# Patient Record
Sex: Male | Born: 1985 | Race: White | Hispanic: No | Marital: Single | Smoking: Current every day smoker
Health system: Southern US, Community
[De-identification: ages and names within clinical notes are randomized; demographics above are authoritative.]

## PROBLEM LIST (undated history)

## (undated) DIAGNOSIS — I10 Essential (primary) hypertension: Secondary | ICD-10-CM

---
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
## 2016-03-13 ENCOUNTER — Encounter (HOSPITAL_BASED_OUTPATIENT_CLINIC_OR_DEPARTMENT_OTHER): Payer: Self-pay | Admitting: *Deleted

## 2016-03-13 ENCOUNTER — Emergency Department (HOSPITAL_BASED_OUTPATIENT_CLINIC_OR_DEPARTMENT_OTHER)
Admission: EM | Admit: 2016-03-13 | Discharge: 2016-03-13 | Disposition: A | Discharge status: Home / Self Care | Payer: Self-pay | Attending: Emergency Medicine | Admitting: Emergency Medicine

## 2016-03-13 DIAGNOSIS — Z760 Encounter for issue of repeat prescription: Secondary | ICD-10-CM | POA: Insufficient documentation

## 2016-03-13 DIAGNOSIS — I10 Essential (primary) hypertension: Secondary | ICD-10-CM | POA: Insufficient documentation

## 2016-03-13 DIAGNOSIS — F17210 Nicotine dependence, cigarettes, uncomplicated: Secondary | ICD-10-CM | POA: Insufficient documentation

## 2016-03-13 DIAGNOSIS — Z79899 Other long term (current) drug therapy: Secondary | ICD-10-CM | POA: Insufficient documentation

## 2016-03-13 HISTORY — DX: Essential (primary) hypertension: I10

## 2016-03-13 MED ORDER — LISINOPRIL-HYDROCHLOROTHIAZIDE 20-12.5 MG PO TABS: 1.0000 | ORAL_TABLET | Freq: Every day | ORAL | 0 refills | Status: DC

## 2016-03-13 NOTE — ED Provider Notes (Signed)
[facility]   CSN: [id number] Arrival date & time: 03/13/16  1143     History   Chief Complaint Chief Complaint  Patient presents with  . Medication Refill    HPI  Blood pressure 161/100, pulse 95, temperature 98.4 F (36.9 C), temperature source Oral, resp. rate 16, height 6' (1.829 m), weight 77.1 kg, SpO2 98 %.  Shaun [name] is a 30 y.o. male requesting refill of his lisinopril hydrochlorothiazide, had his last dose this a.m. while he was in jail, patient is scheduled to go to an outpatient opiate detox facility but they have stated that they cannot take him without his medication. He will be returned to jail over the weekend and will need a prescription to go to the rehabilitation facility. Patient denies chest pain, shortness of breath, headache, change in vision, dysarthria, ataxia.  HPI  Past Medical History:  Diagnosis Date  . Hypertension     There are no active problems to display for this patient.   History reviewed. No pertinent surgical history.     Home Medications    Prior to Admission medications   Medication Sig Start Date End Date Taking? Authorizing Provider  HYDROCHLOROTHIAZIDE PO Take by mouth.   Yes Historical Provider, MD  LISINOPRIL PO Take by mouth.   Yes Historical Provider, MD  lisinopril-hydrochlorothiazide (PRINZIDE,ZESTORETIC) 20-12.5 MG tablet Take 1 tablet by mouth daily. 03/13/16   Joni ReiningNicole Averil Digman, PA-C    Family History No family history on file.  Social History Social History  Substance Use Topics  . Smoking status: Current Every Day Smoker    Packs/day: 1.00    Types: Cigarettes  . Smokeless tobacco: Never Used  . Alcohol use No     Allergies   Penicillins   Review of Systems Review of Systems   Physical Exam Updated Vital Signs BP 161/100 (BP Location: Left Arm)   Pulse 95   Temp 98.4 F (36.9 C) (Oral)   Resp 16   Ht 6' (1.829 m)   Wt 77.1 kg   SpO2 98%   BMI 23.06 kg/m    Physical Exam  Constitutional: He is oriented to person, place, and time. He appears well-developed and well-nourished. No distress.  HENT:  Head: Normocephalic and atraumatic.  Mouth/Throat: Oropharynx is clear and moist.  Eyes: Conjunctivae and EOM are normal. Pupils are equal, round, and reactive to light.  Neck: Normal range of motion.  Cardiovascular: Normal rate, regular rhythm and intact distal pulses.   Pulmonary/Chest: Effort normal and breath sounds normal.  Abdominal: Soft. There is no tenderness.  Musculoskeletal: Normal range of motion.  Neurological: He is alert and oriented to person, place, and time.  Skin: He is not diaphoretic.  Psychiatric: He has a normal mood and affect.  Nursing note and vitals reviewed.    ED Treatments / Results  Labs (all labs ordered are listed, but only abnormal results are displayed) Labs Reviewed - No data to display  EKG  EKG Interpretation None       Radiology No results found.  Procedures Procedures (including critical care time)  Medications Ordered in ED Medications - No data to display   Initial Impression / Assessment and Plan / ED Course  I have reviewed the triage vital signs and the nursing notes.  Pertinent labs & imaging results that were available during my care of the patient were reviewed by me and considered in my medical decision making (see chart for details).  Clinical Course  Vitals:   03/13/16 1150  BP: 161/100  Pulse: 95  Resp: 16  Temp: 98.4 F (36.9 C)  TempSrc: Oral  SpO2: 98%  Weight: 77.1 kg  Height: 6' (1.829 m)     Shaun [name] is 30 y.o. male presenting with Request for medication refill, patient was discharged from jail this a.m., he was set to go to opiates rehabilitation however, he hasn't had his high blood pressure medication so they will not let him into rehabilitation, he will be returned to jail over the weekend and will go back to detox facility on Monday but he will  need a prescription for high blood pressure medication. Patient is asymptomatic.  Evaluation does not show pathology that would require ongoing emergent intervention or inpatient treatment. Pt is hemodynamically stable and mentating appropriately. Discussed findings and plan with patient/guardian, who agrees with care plan. All questions answered. Return precautions discussed and outpatient follow up given.      Final Clinical Impressions(s) / ED Diagnoses   Final diagnoses:  Medication refill    New Prescriptions New Prescriptions   LISINOPRIL-HYDROCHLOROTHIAZIDE (PRINZIDE,ZESTORETIC) 20-12.5 MG TABLET    Take 1 tablet by mouth daily.     Wynetta Emery, PA-C 03/13/16 1216    Lyndal Pulley, MD 03/13/16 [phone]

## 2016-03-13 NOTE — ED Triage Notes (Signed)
States he just got out of jail and needs a refill on his BP medications until he can establish a physician.

## 2016-03-13 NOTE — Discharge Instructions (Signed)
Please follow with your primary care doctor in the next 2 days for a check-up. They must obtain records for further management.  ° °Do not hesitate to return to the Emergency Department for any new, worsening or concerning symptoms.  ° °

## 2016-04-29 DIAGNOSIS — F1910 Other psychoactive substance abuse, uncomplicated: Secondary | ICD-10-CM | POA: Diagnosis present

## 2016-04-29 DIAGNOSIS — B182 Chronic viral hepatitis C: Secondary | ICD-10-CM | POA: Diagnosis present

## 2016-04-29 DIAGNOSIS — B1920 Unspecified viral hepatitis C without hepatic coma: Secondary | ICD-10-CM | POA: Diagnosis present

## 2018-08-30 DIAGNOSIS — F322 Major depressive disorder, single episode, severe without psychotic features: Secondary | ICD-10-CM | POA: Diagnosis present

## 2018-08-31 DIAGNOSIS — F1510 Other stimulant abuse, uncomplicated: Secondary | ICD-10-CM | POA: Insufficient documentation

## 2019-10-20 DIAGNOSIS — A401 Sepsis due to streptococcus, group B: Secondary | ICD-10-CM

## 2019-10-20 HISTORY — DX: Sepsis due to Streptococcus, group B: A40.1

## 2019-12-30 ENCOUNTER — Inpatient Hospital Stay (HOSPITAL_COMMUNITY)
Admission: EM | Admit: 2019-12-30 | Discharge: 2020-01-08 | DRG: 854 | Disposition: A | Discharge status: Home / Self Care | Attending: Internal Medicine | Admitting: Internal Medicine

## 2019-12-30 ENCOUNTER — Emergency Department (HOSPITAL_COMMUNITY)

## 2019-12-30 ENCOUNTER — Other Ambulatory Visit: Payer: Self-pay

## 2019-12-30 ENCOUNTER — Encounter (HOSPITAL_COMMUNITY): Payer: Self-pay | Admitting: Emergency Medicine

## 2019-12-30 DIAGNOSIS — F17210 Nicotine dependence, cigarettes, uncomplicated: Secondary | ICD-10-CM | POA: Diagnosis present

## 2019-12-30 DIAGNOSIS — R21 Rash and other nonspecific skin eruption: Secondary | ICD-10-CM | POA: Diagnosis present

## 2019-12-30 DIAGNOSIS — L02414 Cutaneous abscess of left upper limb: Secondary | ICD-10-CM | POA: Diagnosis present

## 2019-12-30 DIAGNOSIS — I82622 Acute embolism and thrombosis of deep veins of left upper extremity: Secondary | ICD-10-CM | POA: Diagnosis not present

## 2019-12-30 DIAGNOSIS — F1490 Cocaine use, unspecified, uncomplicated: Secondary | ICD-10-CM | POA: Diagnosis present

## 2019-12-30 DIAGNOSIS — Z87898 Personal history of other specified conditions: Secondary | ICD-10-CM

## 2019-12-30 DIAGNOSIS — L03114 Cellulitis of left upper limb: Secondary | ICD-10-CM

## 2019-12-30 DIAGNOSIS — I252 Old myocardial infarction: Secondary | ICD-10-CM

## 2019-12-30 DIAGNOSIS — I5022 Chronic systolic (congestive) heart failure: Secondary | ICD-10-CM

## 2019-12-30 DIAGNOSIS — Z79899 Other long term (current) drug therapy: Secondary | ICD-10-CM

## 2019-12-30 DIAGNOSIS — K5900 Constipation, unspecified: Secondary | ICD-10-CM | POA: Diagnosis present

## 2019-12-30 DIAGNOSIS — Z86718 Personal history of other venous thrombosis and embolism: Secondary | ICD-10-CM | POA: Diagnosis present

## 2019-12-30 DIAGNOSIS — I2510 Atherosclerotic heart disease of native coronary artery without angina pectoris: Secondary | ICD-10-CM

## 2019-12-30 DIAGNOSIS — I110 Hypertensive heart disease with heart failure: Secondary | ICD-10-CM | POA: Diagnosis present

## 2019-12-30 DIAGNOSIS — I472 Ventricular tachycardia: Secondary | ICD-10-CM | POA: Diagnosis not present

## 2019-12-30 DIAGNOSIS — I82409 Acute embolism and thrombosis of unspecified deep veins of unspecified lower extremity: Secondary | ICD-10-CM | POA: Diagnosis not present

## 2019-12-30 DIAGNOSIS — F1911 Other psychoactive substance abuse, in remission: Secondary | ICD-10-CM

## 2019-12-30 DIAGNOSIS — Z880 Allergy status to penicillin: Secondary | ICD-10-CM

## 2019-12-30 DIAGNOSIS — I5021 Acute systolic (congestive) heart failure: Secondary | ICD-10-CM | POA: Diagnosis not present

## 2019-12-30 DIAGNOSIS — L0291 Cutaneous abscess, unspecified: Secondary | ICD-10-CM

## 2019-12-30 DIAGNOSIS — A419 Sepsis, unspecified organism: Principal | ICD-10-CM | POA: Diagnosis present

## 2019-12-30 DIAGNOSIS — R918 Other nonspecific abnormal finding of lung field: Secondary | ICD-10-CM | POA: Diagnosis present

## 2019-12-30 DIAGNOSIS — Z20822 Contact with and (suspected) exposure to covid-19: Secondary | ICD-10-CM | POA: Diagnosis present

## 2019-12-30 DIAGNOSIS — F1910 Other psychoactive substance abuse, uncomplicated: Secondary | ICD-10-CM

## 2019-12-30 DIAGNOSIS — Z885 Allergy status to narcotic agent status: Secondary | ICD-10-CM

## 2019-12-30 DIAGNOSIS — I10 Essential (primary) hypertension: Secondary | ICD-10-CM

## 2019-12-30 DIAGNOSIS — E782 Mixed hyperlipidemia: Secondary | ICD-10-CM | POA: Diagnosis present

## 2019-12-30 DIAGNOSIS — I808 Phlebitis and thrombophlebitis of other sites: Secondary | ICD-10-CM | POA: Diagnosis present

## 2019-12-30 DIAGNOSIS — I255 Ischemic cardiomyopathy: Secondary | ICD-10-CM

## 2019-12-30 HISTORY — DX: Nicotine dependence, cigarettes, uncomplicated: F17.210

## 2019-12-30 HISTORY — DX: Chronic systolic (congestive) heart failure: I50.22

## 2019-12-30 HISTORY — DX: Essential (primary) hypertension: I10

## 2019-12-30 HISTORY — DX: Ischemic cardiomyopathy: I25.5

## 2019-12-30 HISTORY — DX: Atherosclerotic heart disease of native coronary artery without angina pectoris: I25.10

## 2019-12-30 HISTORY — DX: Cellulitis of left upper limb: L03.114

## 2019-12-30 HISTORY — DX: Other psychoactive substance abuse, uncomplicated: F19.10

## 2019-12-30 HISTORY — DX: Mixed hyperlipidemia: E78.2

## 2019-12-30 LAB — CBC WITH DIFFERENTIAL/PLATELET
Abs Immature Granulocytes: 0.03 10*3/uL (ref 0.00–0.07)
Basophils Absolute: 0 10*3/uL (ref 0.0–0.1)
Basophils Relative: 0 %
Eosinophils Absolute: 0 10*3/uL (ref 0.0–0.5)
Eosinophils Relative: 0 %
HCT: 42.6 % (ref 39.0–52.0)
Hemoglobin: 13.9 g/dL (ref 13.0–17.0)
Immature Granulocytes: 0 %
Lymphocytes Relative: 14 %
Lymphs Abs: 1.5 10*3/uL (ref 0.7–4.0)
MCH: 27.6 pg (ref 26.0–34.0)
MCHC: 32.6 g/dL (ref 30.0–36.0)
MCV: 84.5 fL (ref 80.0–100.0)
Monocytes Absolute: 0.8 10*3/uL (ref 0.1–1.0)
Monocytes Relative: 7 %
Neutro Abs: 8.7 10*3/uL — ABNORMAL HIGH (ref 1.7–7.7)
Neutrophils Relative %: 79 %
Platelets: 363 10*3/uL (ref 150–400)
RBC: 5.04 MIL/uL (ref 4.22–5.81)
RDW: 13.3 % (ref 11.5–15.5)
WBC: 11.1 10*3/uL — ABNORMAL HIGH (ref 4.0–10.5)
nRBC: 0 % (ref 0.0–0.2)

## 2019-12-30 LAB — URINALYSIS, ROUTINE W REFLEX MICROSCOPIC
Bacteria, UA: NONE SEEN
Bilirubin Urine: NEGATIVE
Glucose, UA: NEGATIVE mg/dL
Ketones, ur: NEGATIVE mg/dL
Leukocytes,Ua: NEGATIVE
Nitrite: NEGATIVE
Protein, ur: NEGATIVE mg/dL
Specific Gravity, Urine: 1.019 (ref 1.005–1.030)
pH: 7 (ref 5.0–8.0)

## 2019-12-30 LAB — COMPREHENSIVE METABOLIC PANEL
ALT: 19 U/L (ref 0–44)
AST: 16 U/L (ref 15–41)
Albumin: 3.7 g/dL (ref 3.5–5.0)
Alkaline Phosphatase: 84 U/L (ref 38–126)
Anion gap: 11 (ref 5–15)
BUN: 15 mg/dL (ref 6–20)
CO2: 26 mmol/L (ref 22–32)
Calcium: 8.9 mg/dL (ref 8.9–10.3)
Chloride: 95 mmol/L — ABNORMAL LOW (ref 98–111)
Creatinine, Ser: 1.03 mg/dL (ref 0.61–1.24)
GFR calc Af Amer: >60 mL/min (ref >60)
GFR calc non Af Amer: >60 mL/min (ref >60)
Glucose, Bld: 119 mg/dL — ABNORMAL HIGH (ref 70–99)
Potassium: 4 mmol/L (ref 3.5–5.1)
Sodium: 132 mmol/L — ABNORMAL LOW (ref 135–145)
Total Bilirubin: 0.8 mg/dL (ref 0.3–1.2)
Total Protein: 8.8 g/dL — ABNORMAL HIGH (ref 6.5–8.1)

## 2019-12-30 LAB — SARS CORONAVIRUS 2 BY RT PCR (HOSPITAL ORDER, PERFORMED IN ~~LOC~~ HOSPITAL LAB): SARS Coronavirus 2: NEGATIVE

## 2019-12-30 LAB — LACTIC ACID, PLASMA: Lactic Acid, Venous: 1.7 mmol/L (ref 0.5–1.9)

## 2019-12-30 MED ORDER — SODIUM CHLORIDE 0.9 % IV SOLN
2.0000 g | INTRAVENOUS | Status: DC
  Administered 2019-12-31 – 2020-01-04 (×5): 2 g via INTRAVENOUS
  Filled 2019-12-30 (×3): qty 2
  Filled 2019-12-30 (×2): qty 20

## 2019-12-30 MED ORDER — APIXABAN 5 MG PO TABS
5.0000 mg | ORAL_TABLET | Freq: Two times a day (BID) | ORAL | Status: DC
  Administered 2020-01-06 – 2020-01-08 (×4): 5 mg via ORAL
  Filled 2019-12-30 (×4): qty 1

## 2019-12-30 MED ORDER — LISINOPRIL 10 MG PO TABS
10.0000 mg | ORAL_TABLET | Freq: Every day | ORAL | Status: DC
  Administered 2019-12-30 – 2020-01-08 (×9): 10 mg via ORAL
  Filled 2019-12-30 (×9): qty 1

## 2019-12-30 MED ORDER — HYDROMORPHONE HCL 1 MG/ML IJ SOLN
1.0000 mg | INTRAMUSCULAR | Status: DC | PRN
  Administered 2019-12-30 – 2019-12-31 (×3): 1 mg via INTRAVENOUS
  Filled 2019-12-30 (×3): qty 1

## 2019-12-30 MED ORDER — ONDANSETRON HCL 4 MG PO TABS: 4.0000 mg | ORAL_TABLET | Freq: Four times a day (QID) | ORAL | Status: DC | PRN

## 2019-12-30 MED ORDER — CARVEDILOL 3.125 MG PO TABS
3.1250 mg | ORAL_TABLET | Freq: Two times a day (BID) | ORAL | Status: DC
  Administered 2019-12-30 – 2020-01-08 (×17): 3.125 mg via ORAL
  Filled 2019-12-30 (×17): qty 1

## 2019-12-30 MED ORDER — CEFTRIAXONE SODIUM 2 G IJ SOLR
2.0000 g | Freq: Once | INTRAMUSCULAR | Status: AC
  Administered 2019-12-30: 2 g via INTRAVENOUS
  Filled 2019-12-30: qty 20

## 2019-12-30 MED ORDER — ACETAMINOPHEN 325 MG PO TABS
650.0000 mg | ORAL_TABLET | Freq: Four times a day (QID) | ORAL | Status: DC | PRN
  Administered 2019-12-30 – 2020-01-08 (×22): 650 mg via ORAL
  Filled 2019-12-30 (×24): qty 2

## 2019-12-30 MED ORDER — HYDROMORPHONE HCL 1 MG/ML IJ SOLN
1.0000 mg | Freq: Once | INTRAMUSCULAR | Status: AC
  Administered 2019-12-30: 1 mg via INTRAVENOUS
  Filled 2019-12-30: qty 1

## 2019-12-30 MED ORDER — ONDANSETRON HCL 4 MG/2ML IJ SOLN
4.0000 mg | Freq: Once | INTRAMUSCULAR | Status: AC
  Administered 2019-12-30: 4 mg via INTRAVENOUS
  Filled 2019-12-30: qty 2

## 2019-12-30 MED ORDER — OXYCODONE-ACETAMINOPHEN 5-325 MG PO TABS
1.0000 | ORAL_TABLET | ORAL | Status: DC | PRN
  Administered 2019-12-31: 1 via ORAL
  Filled 2019-12-30: qty 1

## 2019-12-30 MED ORDER — MORPHINE SULFATE (PF) 4 MG/ML IV SOLN
4.0000 mg | Freq: Once | INTRAVENOUS | Status: AC
  Administered 2019-12-30: 4 mg via INTRAVENOUS
  Filled 2019-12-30: qty 1

## 2019-12-30 MED ORDER — ONDANSETRON HCL 4 MG/2ML IJ SOLN: 4.0000 mg | Freq: Four times a day (QID) | INTRAMUSCULAR | Status: DC | PRN

## 2019-12-30 MED ORDER — ACETAMINOPHEN 650 MG RE SUPP: 650.0000 mg | Freq: Four times a day (QID) | RECTAL | Status: DC | PRN

## 2019-12-30 MED ORDER — APIXABAN 5 MG PO TABS
10.0000 mg | ORAL_TABLET | Freq: Two times a day (BID) | ORAL | Status: AC
  Administered 2019-12-30 – 2020-01-06 (×12): 10 mg via ORAL
  Filled 2019-12-30 (×13): qty 2

## 2019-12-30 MED ORDER — ATORVASTATIN CALCIUM 40 MG PO TABS
40.0000 mg | ORAL_TABLET | Freq: Every day | ORAL | Status: DC
  Administered 2019-12-30 – 2020-01-07 (×9): 40 mg via ORAL
  Filled 2019-12-30 (×9): qty 1

## 2019-12-30 MED ORDER — POLYETHYLENE GLYCOL 3350 17 G PO PACK
17.0000 g | PACK | Freq: Every day | ORAL | Status: DC | PRN
  Administered 2020-01-02 – 2020-01-04 (×3): 17 g via ORAL
  Filled 2019-12-30 (×3): qty 1

## 2019-12-30 MED ORDER — SODIUM CHLORIDE 0.9 % IV BOLUS
1000.0000 mL | Freq: Once | INTRAVENOUS | Status: AC
  Administered 2019-12-30: 1000 mL via INTRAVENOUS

## 2019-12-30 MED ORDER — ASPIRIN EC 81 MG PO TBEC
81.0000 mg | DELAYED_RELEASE_TABLET | Freq: Every day | ORAL | Status: DC
  Administered 2019-12-30 – 2020-01-08 (×9): 81 mg via ORAL
  Filled 2019-12-30 (×9): qty 1

## 2019-12-30 MED ORDER — NICOTINE 21 MG/24HR TD PT24
21.0000 mg | MEDICATED_PATCH | Freq: Every day | TRANSDERMAL | Status: DC
  Administered 2019-12-30 – 2020-01-08 (×9): 21 mg via TRANSDERMAL
  Filled 2019-12-30 (×10): qty 1

## 2019-12-30 MED ORDER — VANCOMYCIN HCL IN DEXTROSE 1-5 GM/200ML-% IV SOLN
1000.0000 mg | Freq: Once | INTRAVENOUS | Status: AC
  Administered 2019-12-30: 1000 mg via INTRAVENOUS
  Filled 2019-12-30: qty 200

## 2019-12-30 NOTE — ED Notes (Signed)
Unable to obtain second set of culture before IV antibiotics given due to pt being a difficult stick. MD made aware.

## 2019-12-30 NOTE — Progress Notes (Signed)
A consult was received from an ED physician for vancomycin per pharmacy dosing (for an indication other than meningitis). The patient's profile has been reviewed for ht/wt/allergies/indication/available labs. A one time order has been placed for the above antibiotics.  Further antibiotics/pharmacy consults should be ordered by admitting physician if indicated.                       Bernadene Person, PharmD, BCPS [phone] 12/30/2019, 4:12 PM

## 2019-12-30 NOTE — ED Triage Notes (Signed)
Per pt, states he was at [facility] for sepsis and blood clots-states he left AMA-states he fell in jail landing on left arm-states it was swollen prior to fall-increased pain-last IV drug use was 9 days ago

## 2019-12-30 NOTE — ED Provider Notes (Addendum)
[facility]-EMERGENCY DEPT Provider Note   CSN: [id number] Arrival date & time: 12/30/19  1148     History Chief Complaint  Patient presents with  . Fall    Caz Weaver is a 34 y.o. male.  Patient with hx ivda, c/o increased redness, swelling, and pain to LUE in past 3-4 days. States has been in jail for past week, prior to that ivda into LUE. Was hospitalized a couple months ago with cellulitis/infection to LUE, and DVT LUE, but left hospital AMA without continued antibiotic therapy or anticoagulation therapy. Symptoms acute onset, moderate-severe, constant, persistent. Denies LUE/hand numbness or weakness. +subjective fevers. Denies recent trauma to arm. No chest pain or sob.   The history is provided by the patient.  Fall Pertinent negatives include no chest pain, no abdominal pain, no headaches and no shortness of breath.       Past Medical History:  Diagnosis Date  . Hypertension     There are no problems to display for this patient.   History reviewed. No pertinent surgical history.     No family history on file.  Social History   Tobacco Use  . Smoking status: Current Every Day Smoker    Packs/day: 1.00    Types: Cigarettes  . Smokeless tobacco: Never Used  Substance Use Topics  . Alcohol use: No  . Drug use: Yes    Types: IV    Comment: opiates    Home Medications Prior to Admission medications   Medication Sig Start Date End Date Taking? Authorizing Provider  HYDROCHLOROTHIAZIDE PO Take by mouth.    [provider]  LISINOPRIL PO Take by mouth.    [provider]  lisinopril-hydrochlorothiazide (PRINZIDE,ZESTORETIC) 20-12.5 MG tablet Take 1 tablet by mouth daily. 03/13/16   Pisciotta, Joni Reining, PA-C    Allergies    Penicillins  Review of Systems   Review of Systems  Constitutional: Positive for fever.  HENT: Negative for sore throat.   Eyes: Negative for redness.  Respiratory: Negative for cough and  shortness of breath.   Cardiovascular: Negative for chest pain.  Gastrointestinal: Positive for nausea. Negative for abdominal pain and vomiting.  Genitourinary: Negative for dysuria and flank pain.  Musculoskeletal: Negative for back pain and neck pain.  Skin:       Multiple small skin lesions, scabbed appearance, approximately .5 -1 cm diameter.   Neurological: Negative for headaches.  Hematological: Does not bruise/bleed easily.  Psychiatric/Behavioral: Negative for confusion.    Physical Exam Updated Vital Signs BP (!) 130/101 (BP Location: Right Arm)   Pulse (!) 107   Temp 98.4 F (36.9 C) (Oral)   Resp (!) 24   SpO2 100%   Physical Exam Vitals and nursing note reviewed.  Constitutional:      Appearance: Normal appearance. He is well-developed.  HENT:     Head: Atraumatic.     Nose: Nose normal.     Mouth/Throat:     Mouth: Mucous membranes are moist.     Pharynx: Oropharynx is clear.  Eyes:     General: No scleral icterus.    Conjunctiva/sclera: Conjunctivae normal.     Pupils: Pupils are equal, round, and reactive to light.  Neck:     Trachea: No tracheal deviation.  Cardiovascular:     Rate and Rhythm: Regular rhythm. Tachycardia present.     Pulses: Normal pulses.     Heart sounds: Normal heart sounds. No murmur heard.  No friction rub. No gallop.  Pulmonary:     Effort: Pulmonary effort is normal. No accessory muscle usage or respiratory distress.     Breath sounds: Normal breath sounds.  Abdominal:     General: Bowel sounds are normal. There is no distension.     Palpations: Abdomen is soft.     Tenderness: There is no abdominal tenderness. There is no guarding.  Genitourinary:    Comments: No cva tenderness. Musculoskeletal:     Cervical back: Normal range of motion and neck supple. No rigidity.     Comments: Erythema and increased warmth to LUE from wrist to upper arm. Moderate swelling to left forearm. Radial pulse 2+, normal cap refill in fingers.   Compartments of arm soft, not tense, no severe pain w rom at digits, wrist or elbow. No crepitus. No abscess.   Skin:    General: Skin is warm and dry.     Findings: Rash present.     Comments: Cellulitis LUE. Multiple, sparse, skin lesions/scabs to head, bil extremities, 1/2-1cm diameter.   Neurological:     Mental Status: He is alert.     Comments: Alert, speech clear. LUE rad/med/uln fxn, motor and sensory, intact.   Psychiatric:        Mood and Affect: Mood normal.     ED Results / Procedures / Treatments   Labs (all labs ordered are listed, but only abnormal results are displayed) Results for orders placed or performed during the hospital encounter of 12/30/19  Lactic acid, plasma  Result Value Ref Range   Lactic Acid, Venous 1.7 0.5 - 1.9 mmol/L  Comprehensive metabolic panel  Result Value Ref Range   Sodium 132 (L) 135 - 145 mmol/L   Potassium 4.0 3.5 - 5.1 mmol/L   Chloride 95 (L) 98 - 111 mmol/L   CO2 26 22 - 32 mmol/L   Glucose, Bld 119 (H) 70 - 99 mg/dL   BUN 15 6 - 20 mg/dL   Creatinine, Ser 1.03 0.61 - 1.24 mg/dL   Calcium 8.9 8.9 - 10.3 mg/dL   Total Protein 8.8 (H) 6.5 - 8.1 g/dL   Albumin 3.7 3.5 - 5.0 g/dL   AST 16 15 - 41 U/L   ALT 19 0 - 44 U/L   Alkaline Phosphatase 84 38 - 126 U/L   Total Bilirubin 0.8 0.3 - 1.2 mg/dL   GFR calc non Af Amer >60 >60 mL/min   GFR calc Af Amer >60 >60 mL/min   Anion gap 11 5 - 15  CBC with Differential  Result Value Ref Range   WBC 11.1 (H) 4.0 - 10.5 K/uL   RBC 5.04 4.22 - 5.81 MIL/uL   Hemoglobin 13.9 13.0 - 17.0 g/dL   HCT 42.6 39 - 52 %   MCV 84.5 80.0 - 100.0 fL   MCH 27.6 26.0 - 34.0 pg   MCHC 32.6 30.0 - 36.0 g/dL   RDW 13.3 11.5 - 15.5 %   Platelets 363 150 - 400 K/uL   nRBC 0.0 0.0 - 0.2 %   Neutrophils Relative % 79 %   Neutro Abs 8.7 (H) 1.7 - 7.7 K/uL   Lymphocytes Relative 14 %   Lymphs Abs 1.5 0.7 - 4.0 K/uL   Monocytes Relative 7 %   Monocytes Absolute 0.8 0 - 1 K/uL   Eosinophils Relative 0 %     Eosinophils Absolute 0.0 0 - 0 K/uL   Basophils Relative 0 %   Basophils Absolute 0.0 0 - 0 K/uL  Immature Granulocytes 0 %   Abs Immature Granulocytes 0.03 0.00 - 0.07 K/uL   DG Forearm Left  Result Date: 12/30/2019 CLINICAL DATA:  Pain in LEFT forearm unable to rotate arm into full position. EXAM: LEFT FOREARM - 2 VIEW COMPARISON:  RIGHT wrist of 02/07/2019 FINDINGS: Extensive soft tissue swelling about the LEFT forearm. No gas in the soft tissues by plain film radiography. No sign of fracture or dislocation. IMPRESSION: Marked soft tissue swelling about the LEFT forearm. No acute osseous abnormality. Correlate with any signs of infection. Electronically Signed   By: Donzetta Kohut M.D.   On: 12/30/2019 14:07    EKG None  Radiology DG Forearm Left  Result Date: 12/30/2019 CLINICAL DATA:  Pain in LEFT forearm unable to rotate arm into full position. EXAM: LEFT FOREARM - 2 VIEW COMPARISON:  RIGHT wrist of 02/07/2019 FINDINGS: Extensive soft tissue swelling about the LEFT forearm. No gas in the soft tissues by plain film radiography. No sign of fracture or dislocation. IMPRESSION: Marked soft tissue swelling about the LEFT forearm. No acute osseous abnormality. Correlate with any signs of infection. Electronically Signed   By: Donzetta Kohut M.D.   On: 12/30/2019 14:07    Procedures Procedures (including critical care time)  Medications Ordered in ED Medications  vancomycin (VANCOCIN) IVPB 1000 mg/200 mL premix (has no administration in time range)  cefTRIAXone (ROCEPHIN) 2 g in sodium chloride 0.9 % 100 mL IVPB (has no administration in time range)  HYDROmorphone (DILAUDID) injection 1 mg (has no administration in time range)  ondansetron (ZOFRAN) injection 4 mg (has no administration in time range)  sodium chloride 0.9 % bolus 1,000 mL (has no administration in time range)    ED Course  I have reviewed the triage vital signs and the nursing notes.  Pertinent labs & imaging  results that were available during my care of the patient were reviewed by me and considered in my medical decision making (see chart for details).    MDM Rules/Calculators/A&P                          Iv ns. Stat labs and cultures. Imaging.   Reviewed nursing notes and prior charts for additional history. Recent admission for similar symptoms reviewed - dx w acute dvt and cellulitis/soft tissue infection then, pt left AMA.  Pt requests pain med - dilaudid 1 mg iv. zofran iv. IV NS bolus.   Labs reviewed/interpreted by me - lactate is normal. Wbc elev.   Given significant cellulitis LUE, will admit for iv abx, and starting back on anticoag therapy.   Pain improved w meds.   Hospitalists consulted for admission.  Final Clinical Impression(s) / ED Diagnoses Final diagnoses:  None    Rx / DC Orders ED Discharge Orders    None          Cathren Laine, MD 12/30/19 1856

## 2019-12-30 NOTE — H&P (Signed)
History and Physical    Shaun [name] CVE:[id number] DOB: January 18, 1986 DOA: 12/30/2019  PCP: Patient, No Pcp Per  Patient coming from: Prison   Chief Complaint:  Chief Complaint  Patient presents with  . Fall     HPI:    34 year old male with past medical history of intravenous drug abuse (Heroin, Fentanyl, Cocaine), ischemic cardiomyopathy status post MI with drug-eluting stent in 2017, systolic congestive heart failure (echo 2017 with EF 25%), hypertension, hyperlipidemia who presents to [facility] emergency department complaints of left upper extremity pain.  Of note, patient was hospitalized at [facility] in April 2021.  During that hospitalization patient was found to have cellulitis of the chin and left upper extremity with concurrent left upper extremity DVT.  Patient was also found to have group B strep sepsis.  Patient was initially treated with anticoagulation and intravenous antibiotic therapy however patient ended up leaving AMA prior to completion of treatment.  Patient left without receiving any home-going anticoagulation or antibiotics.  Patient was incarcerated 8 days ago.  Patient explains that approximately 4 days ago the pain in his left upper extremity began to worsen.  Pain begins in the forearm and radiates proximally.  Pain is 10 out of 10 in intensity, burning to sharp in quality, worse with movement of the left upper extremity.  Patient also complains of poor appetite and generalized weakness.  Patient is also complaining of subjective fevers.  Patient admits to continued injection of various drugs including fentanyl even in that arm with last injection 8 days ago.  Patient was brought in to [facility] emergency department for evaluation and is being escorted by an Technical sales engineer.  Upon evaluation in the emergency department patient is clinically felt to be suffering from persisting left upper extremity cellulitis and suspected persisting  left upper extremity DVT.  Patient was provided with Eliquis 10 mg, ceftriaxone 2 g and 1 g of vancomycin.  Patient was also provided with 1 L of normal saline.  Blood cultures were obtained.  The hospitalist group was then called to assess the patient for admission the hospital.  Review of Systems: A 10-system review of systems has been performed and all systems are negative with the exception of what is listed in the HPI.   Past Medical History:  Diagnosis Date  . Beta-hemolytic group B streptococcal sepsis (HCC) 10/2019   at [facility]  . Chronic systolic CHF (congestive heart failure) (HCC) 12/30/2019  . Coronary artery disease involving native coronary artery of native heart without angina pectoris 12/30/2019  . Essential hypertension 12/30/2019  . Hypertension   . Intravenous drug abuse (HCC) 12/30/2019  . Ischemic cardiomyopathy 12/30/2019  . Mixed hyperlipidemia 12/30/2019  . Nicotine dependence, cigarettes, uncomplicated 12/30/2019    History reviewed. No pertinent surgical history.   reports that he has been smoking cigarettes. He has been smoking about 1.00 pack per day. He has never used smokeless tobacco. He reports current drug use. Drug: IV. He reports that he does not drink alcohol.  Allergies  Allergen Reactions  . Codeine Itching  . Penicillins     Itching Did it involve swelling of the face/tongue/throat, SOB, or low BP? SOB & Swelling Which Caused Blood Clots Did it involve sudden or severe rash/hives, skin peeling, or any reaction on the inside of your mouth or nose? No Did you need to seek medical attention at a hospital or doctor's office? Y When did it last happen?Over month ago If all  above answers are "NO", may proceed with cephalosporin use.      Family History  Family history unknown: Yes     Prior to Admission medications   Medication Sig Start Date End Date Taking? Authorizing Provider  Atorvastatin Calcium (LIPITOR PO) Take 1 tablet by mouth  daily.   Yes [provider]  FLUoxetine (PROZAC) 20 MG capsule Take 20 mg by mouth daily.   Yes [provider]  levETIRAcetam (KEPPRA PO) Take 1 tablet by mouth daily.   Yes [provider]  lisinopril-hydrochlorothiazide (PRINZIDE,ZESTORETIC) 20-12.5 MG tablet Take 1 tablet by mouth daily. 03/13/16  Yes Waynetta Pean    Physical Exam: Vitals:   12/30/19 1830 12/30/19 1930 12/30/19 1950 12/30/19 2000  BP: (!) 143/78 133/82  134/88  Pulse: (!) 108 (!) 106  (!) 110  Resp: 18 16  18   Temp:      TempSrc:      SpO2: 95% 96%  97%  Weight:   72.6 kg   Height:   6' (1.829 m)     Constitutional: Acute alert and oriented x3, patient is in distress due to pain. Skin: Significant redness and induration of the left upper extremity without palpated area of fluctuance.  Somewhat poor skin turgor noted. Eyes: Pupils are equally reactive to light.  No evidence of scleral icterus or conjunctival pallor.  ENMT: Moist mucous membranes noted.  Posterior pharynx clear of any exudate or lesions.   Neck: normal, supple, no masses, no thyromegaly.  No evidence of jugular venous distension.   Respiratory: Mild bibasilar rales noted, no evidence of wheezing.  Normal respiratory effort. No accessory muscle use.  Cardiovascular: Regular rate and rhythm, no murmurs / rubs / gallops. No extremity edema. 2+ pedal pulses. No carotid bruits.  Chest:   Nontender without crepitus or deformity.   Back:   Nontender without crepitus or deformity. Abdomen: Abdomen is soft and nontender.  No evidence of intra-abdominal masses.  Positive bowel sounds noted in all quadrants.   Musculoskeletal: Extensive left lower extremity edema with associated redness and induration as detailed in the skin examination.  Significant pain with both passive and active range of motion of both the wrist and digits of the left hand.   Neurologic: CN 2-12 grossly intact.  Patient is moving all 4 extremities  spontaneously, strength of the left upper extremity is limited due to pain and swelling.  Patient is following all commands.  Patient is responsive to verbal stimuli.   Psychiatric: Patient exhibits a anxious mood with labile affect.  Patient seems to possess insight as to theircurrent situation.     Labs on Admission: I have personally reviewed following labs and imaging studies -   CBC: Recent Labs  Lab 12/30/19 1208  WBC 11.1*  NEUTROABS 8.7*  HGB 13.9  HCT 42.6  MCV 84.5  PLT 195   Basic Metabolic Panel: Recent Labs  Lab 12/30/19 1208  NA 132*  K 4.0  CL 95*  CO2 26  GLUCOSE 119*  BUN 15  CREATININE 1.03  CALCIUM 8.9   GFR: Estimated Creatinine Clearance: 103.8 mL/min (by C-G formula based on SCr of 1.03 mg/dL). Liver Function Tests: Recent Labs  Lab 12/30/19 1208  AST 16  ALT 19  ALKPHOS 84  BILITOT 0.8  PROT 8.8*  ALBUMIN 3.7   No results for input(s): LIPASE, AMYLASE in the last 168 hours. No results for input(s): AMMONIA in the last 168 hours. Coagulation Profile: No results for input(s): INR,  PROTIME in the last 168 hours. Cardiac Enzymes: No results for input(s): CKTOTAL, CKMB, CKMBINDEX, TROPONINI in the last 168 hours. BNP (last 3 results) No results for input(s): PROBNP in the last 8760 hours. HbA1C: No results for input(s): HGBA1C in the last 72 hours. CBG: No results for input(s): GLUCAP in the last 168 hours. Lipid Profile: No results for input(s): CHOL, HDL, LDLCALC, TRIG, CHOLHDL, LDLDIRECT in the last 72 hours. Thyroid Function Tests: No results for input(s): TSH, T4TOTAL, FREET4, T3FREE, THYROIDAB in the last 72 hours. Anemia Panel: No results for input(s): VITAMINB12, FOLATE, FERRITIN, TIBC, IRON, RETICCTPCT in the last 72 hours. Urine analysis: No results found for: COLORURINE, APPEARANCEUR, LABSPEC, PHURINE, GLUCOSEU, HGBUR, BILIRUBINUR, KETONESUR, PROTEINUR, UROBILINOGEN, NITRITE, LEUKOCYTESUR  Radiological Exams on Admission -  Personally Reviewed: DG Forearm Left  Result Date: 12/30/2019 CLINICAL DATA:  Pain in LEFT forearm unable to rotate arm into full position. EXAM: LEFT FOREARM - 2 VIEW COMPARISON:  RIGHT wrist of 02/07/2019 FINDINGS: Extensive soft tissue swelling about the LEFT forearm. No gas in the soft tissues by plain film radiography. No sign of fracture or dislocation. IMPRESSION: Marked soft tissue swelling about the LEFT forearm. No acute osseous abnormality. Correlate with any signs of infection. Electronically Signed   By: Donzetta Kohut M.D.   On: 12/30/2019 14:07    Assessment/Plan Active Problems:   Cellulitis of left upper extremity   Significant recurrent cellulitis of the left upper extremity in the setting of incomplete treatment in April at [facility]  It is suspected the patient is also suffering from a persisting left upper extremity DVT.  Patient is exhibiting some SIRS criteria including tachycardia and leukocytosis, in the absence of organ dysfunction I do not believe patient is septic.  Patient is already been hydrated with 1000 cc of normal saline by the emergency department.  Considering history of ischemic cardiomyopathy with ejection fraction 25% will abstain from further intravenous fluids.  Based on our cellulitis order set will provide patient ceftriaxone every 24 hours intravenously  Blood cultures have been obtained and antibiotics will be de-escalated based on these results.  MRSA PCR screen ordered  Unfortunately, patient is suffering from very real, severe pain despite his history of intravenous drug abuse.  Will place patient on opiate-based analgesics for pain which will be weaned and discontinued as quickly as possible.  X-ray of the left upper extremity reveals no evidence of current osteomyelitis.    Acute deep vein thrombosis (DVT) of left upper extremity (HCC)   Patient has suspected persisting left upper extremity DVT identified during  recent hospitalization in April at [facility]  Patient been placed on Eliquis 10 mg by mouth by the emergency department staff which will be continued to complete a 7-day course followed by 5 mg twice daily for at least a 44-month course.  Will order left upper extremity ultrasound which will likely be performed in the morning to identify whether clot is still present.    Coronary artery disease involving native coronary artery of native heart without angina pectoris  Patient is currently chest pain-free  Patient does not know the names or doses of any of his medications however based on my review of [facility] health records have placed the patient on aspirin 81 mg daily, atorvastatin 40 mg daily, lisinopril 10 mg daily and Coreg 3.125 mg twice daily.  Monitoring patient on telemetry    Essential hypertension   Patient has been placed on Coreg 3.125  mg twice daily as well as lisinopril 10 mg daily    Mixed hyperlipidemia   Patient is been placed on atorvastatin 40 mg by mouth daily    Intravenous drug abuse (HCC)   Last use 8 days ago due to incarceration  Patient has history of heroin, cocaine and fentanyl use  Patient is been counseled on cessation    Chronic systolic CHF (congestive heart failure) (HCC)   No evidence of cardiogenic volume overload at this time  Will order repeat echocardiogram in the morning    Nicotine dependence, cigarettes, uncomplicated  Counseling patient on cessation  Provided patient with nicotine patches for nicotine replacement therapy   Code Status:  Full code Family Communication: Deferred  Status is: Inpatient  Remains inpatient appropriate because:Ongoing diagnostic testing needed not appropriate for outpatient work up, IV treatments appropriate due to intensity of illness or inability to take PO and Inpatient level of care appropriate due to severity of illness   Dispo: The patient is from: prison               Anticipated d/c is to: prison              Anticipated d/c date is: > 3 days              Patient currently is not medically stable to d/c.        Marinda Elk MD [facility] Pager [phone]  If 7PM-7AM, please contact night-coverage [email or web address] Use universal [place] password for that web site. If you do not have the password, please call the hospital operator.  12/30/2019, 8:21 PM

## 2019-12-30 NOTE — ED Notes (Signed)
ED TO INPATIENT HANDOFF REPORT  Name/Age/Gender Shaun [name] 34 y.o. male  Code Status   Home/SNF/Other Jail  Chief Complaint Cellulitis of left upper extremity [L03.114]  Level of Care/Admitting Diagnosis ED Disposition    ED Disposition Condition Comment   Admit  Hospital Area: [facility] [[id number]]  Level of Care: Telemetry [5]  Admit to tele based on following criteria: Other see comments  Comments: CAD, CHF, AV nodal blockin agents  May admit patient to [facility] or [facility] if equivalent level of care is available:: No  Covid Evaluation: Asymptomatic Screening Protocol (No Symptoms)  Admission Type: Emergency [1]  Diagnosis: Cellulitis of left upper extremity [315176]  Admitting Physician: Marinda Elk [[id number]]  Attending Physician: Marinda Elk [[id number]]  Estimated length of stay: 3 - 4 days  Certification:: I certify this patient will need inpatient services for at least 2 midnights       Medical History Past Medical History:  Diagnosis Date  . Beta-hemolytic group B streptococcal sepsis (HCC) 10/2019   at [facility]  . Chronic systolic CHF (congestive heart failure) (HCC) 12/30/2019  . Coronary artery disease involving native coronary artery of native heart without angina pectoris 12/30/2019  . Essential hypertension 12/30/2019  . Hypertension   . Intravenous drug abuse (HCC) 12/30/2019  . Ischemic cardiomyopathy 12/30/2019  . Mixed hyperlipidemia 12/30/2019  . Nicotine dependence, cigarettes, uncomplicated 12/30/2019    Allergies Allergies  Allergen Reactions  . Codeine Itching  . Penicillins     Itching Did it involve swelling of the face/tongue/throat, SOB, or low BP? SOB & Swelling Which Caused Blood Clots Did it involve sudden or severe rash/hives, skin peeling, or any reaction on the inside of your mouth or nose? No Did you need to seek medical attention at a hospital or doctor's office? Y When did it last  happen?Over month ago If all above answers are "NO", may proceed with cephalosporin use.      IV Location/Drains/Wounds Patient Lines/Drains/Airways Status    Active Line/Drains/Airways    Name Placement date Placement time Site Days   Peripheral IV 12/30/19 Right;Upper Arm 12/30/19  1655  Arm  less than 1          Labs/Imaging Results for orders placed or performed during the hospital encounter of 12/30/19 (from the past 48 hour(s))  Lactic acid, plasma     Status: None   Collection Time: 12/30/19 12:08 PM  Result Value Ref Range   Lactic Acid, Venous 1.7 0.5 - 1.9 mmol/L    Comment: Performed at [facility], [address], [place], [place] [address]  Comprehensive metabolic panel     Status: Abnormal   Collection Time: 12/30/19 12:08 PM  Result Value Ref Range   Sodium 132 (L) 135 - 145 mmol/L   Potassium 4.0 3.5 - 5.1 mmol/L   Chloride 95 (L) 98 - 111 mmol/L   CO2 26 22 - 32 mmol/L   Glucose, Bld 119 (H) 70 - 99 mg/dL    Comment: Glucose reference range applies only to samples taken after fasting for at least 8 hours.   BUN 15 6 - 20 mg/dL   Creatinine, Ser 0.35 0.61 - 1.24 mg/dL   Calcium 8.9 8.9 - 00.9 mg/dL   Total Protein 8.8 (H) 6.5 - 8.1 g/dL   Albumin 3.7 3.5 - 5.0 g/dL   AST 16 15 - 41 U/L   ALT 19 0 - 44 U/L   Alkaline Phosphatase  84 38 - 126 U/L   Total Bilirubin 0.8 0.3 - 1.2 mg/dL   GFR calc non Af Amer >60 >60 mL/min   GFR calc Af Amer >60 >60 mL/min   Anion gap 11 5 - 15    Comment: Performed at [facility], [address], [place], [place] [address]  CBC with Differential     Status: Abnormal   Collection Time: 12/30/19 12:08 PM  Result Value Ref Range   WBC 11.1 (H) 4.0 - 10.5 K/uL   RBC 5.04 4.22 - 5.81 MIL/uL   Hemoglobin 13.9 13.0 - 17.0 g/dL   HCT 43.1 39 - 52 %   MCV 84.5 80.0 - 100.0 fL   MCH 27.6 26.0 - 34.0 pg   MCHC 32.6 30.0 - 36.0 g/dL   RDW 54.0 08.6 - 76.1 %   Platelets 363 150 - 400  K/uL   nRBC 0.0 0.0 - 0.2 %   Neutrophils Relative % 79 %   Neutro Abs 8.7 (H) 1.7 - 7.7 K/uL   Lymphocytes Relative 14 %   Lymphs Abs 1.5 0.7 - 4.0 K/uL   Monocytes Relative 7 %   Monocytes Absolute 0.8 0 - 1 K/uL   Eosinophils Relative 0 %   Eosinophils Absolute 0.0 0 - 0 K/uL   Basophils Relative 0 %   Basophils Absolute 0.0 0 - 0 K/uL   Immature Granulocytes 0 %   Abs Immature Granulocytes 0.03 0.00 - 0.07 K/uL    Comment: Performed at [facility], [address], [place], [place] [address]  Urinalysis, Routine w reflex microscopic     Status: Abnormal   Collection Time: 12/30/19  7:31 PM  Result Value Ref Range   Color, Urine YELLOW YELLOW   APPearance CLEAR CLEAR   Specific Gravity, Urine 1.019 1.005 - 1.030   pH 7.0 5.0 - 8.0   Glucose, UA NEGATIVE NEGATIVE mg/dL   Hgb urine dipstick SMALL (A) NEGATIVE   Bilirubin Urine NEGATIVE NEGATIVE   Ketones, ur NEGATIVE NEGATIVE mg/dL   Protein, ur NEGATIVE NEGATIVE mg/dL   Nitrite NEGATIVE NEGATIVE   Leukocytes,Ua NEGATIVE NEGATIVE   RBC / HPF 21-50 0 - 5 RBC/hpf   WBC, UA 0-5 0 - 5 WBC/hpf   Bacteria, UA NONE SEEN NONE SEEN   Mucus PRESENT     Comment: Performed at [facility], [address] [place], [place] [address]  SARS Coronavirus 2 by RT PCR (hospital order, performed in [facility] hospital lab) Nasopharyngeal Nasopharyngeal Swab     Status: None   Collection Time: 12/30/19  7:47 PM   Specimen: Nasopharyngeal Swab  Result Value Ref Range   SARS Coronavirus 2 NEGATIVE NEGATIVE    Comment: (NOTE) SARS-CoV-2 target nucleic acids are NOT DETECTED.  The SARS-CoV-2 RNA is generally detectable in upper and lower respiratory specimens during the acute phase of infection. The lowest concentration of SARS-CoV-2 viral copies this assay can detect is 250 copies / mL. A negative result does not preclude SARS-CoV-2 infection and should not be used as the sole basis for treatment or  other patient management decisions.  A negative result may occur with improper specimen collection / handling, submission of specimen other than nasopharyngeal swab, presence of viral mutation(s) within the areas targeted by this assay, and inadequate number of viral copies (<250 copies / mL). A negative result must be combined with clinical observations, patient history, and epidemiological information.  Fact Sheet for Patients:  BoilerBrush.com.cy  Fact Sheet for Healthcare Providers: https://pope.com/  This test is not yet approved or  cleared by the [place] FDA and has been authorized for detection and/or diagnosis of SARS-CoV-2 by FDA under an Emergency Use Authorization (EUA).  This EUA will remain in effect (meaning this test can be used) for the duration of the COVID-19 declaration under Section 564(b)(1) of the Act, 21 U.S.C. section 360bbb-3(b)(1), unless the authorization is terminated or revoked sooner.  Performed at [facility], [address], [place], [place] [address]    DG Chest 1 View  Result Date: 12/30/2019 CLINICAL DATA:  History of IVDA with pain and swelling in the left upper extremity EXAM: CHEST  1 VIEW COMPARISON:  11/15/2018 FINDINGS: Cardiac shadow is stable. Coronary stenting is noted. Lungs are well aerated bilaterally. No focal infiltrate or sizable effusion is seen. No bony abnormality is noted. IMPRESSION: No acute abnormality seen. Electronically Signed   By: Alcide Clever M.D.   On: 12/30/2019 20:27   DG Forearm Left  Result Date: 12/30/2019 CLINICAL DATA:  Pain in LEFT forearm unable to rotate arm into full position. EXAM: LEFT FOREARM - 2 VIEW COMPARISON:  RIGHT wrist of 02/07/2019 FINDINGS: Extensive soft tissue swelling about the LEFT forearm. No gas in the soft tissues by plain film radiography. No sign of fracture or dislocation. IMPRESSION: Marked soft tissue swelling  about the LEFT forearm. No acute osseous abnormality. Correlate with any signs of infection. Electronically Signed   By: Donzetta Kohut M.D.   On: 12/30/2019 14:07    Pending Labs Unresulted Labs (From admission, onward) Comment          Start     Ordered   12/30/19 1604  Blood culture (routine x 2)  BLOOD CULTURE X 2,   STAT      12/30/19 1604   Signed and Held  HIV Antibody (routine testing w rflx)  (HIV Antibody (Routine testing w reflex) panel)  Once,   R        Signed and Held   Signed and Held  Comprehensive metabolic panel  Tomorrow morning,   R        Signed and Held   Signed and Held  CBC WITH DIFFERENTIAL  Tomorrow morning,   R        Signed and Held   Signed and Held  Magnesium  Tomorrow morning,   R        Signed and Held   Signed and Held  MRSA PCR Screening  Once,   R        Signed and Held          Vitals/Pain Today's Vitals   12/30/19 2114 12/30/19 2130 12/30/19 2200 12/30/19 2207  BP:  135/83 130/71   Pulse:  (!) 120 (!) 118   Resp:  (!) 24 (!) 30   Temp:      TempSrc:      SpO2:  96% 95%   Weight:      Height:      PainSc: 10-Worst pain ever   8     Isolation Precautions No active isolations  Medications Medications  apixaban (ELIQUIS) tablet 10 mg (10 mg Oral Given 12/30/19 1951)    Followed by  apixaban (ELIQUIS) tablet 5 mg (has no administration in time range)  oxyCODONE-acetaminophen (PERCOCET/ROXICET) 5-325 MG per tablet 1 tablet ( Oral See Alternative 12/30/19 2118)    Or  HYDROmorphone (DILAUDID) injection 1 mg (1 mg  Intravenous Given 12/30/19 2118)  nicotine (NICODERM CQ - dosed in mg/24 hours) patch 21 mg (21 mg Transdermal Patch Applied 12/30/19 2119)  atorvastatin (LIPITOR) tablet 40 mg (40 mg Oral Given 12/30/19 2206)  aspirin EC tablet 81 mg (81 mg Oral Given 12/30/19 2206)  lisinopril (ZESTRIL) tablet 10 mg (10 mg Oral Given 12/30/19 2206)  carvedilol (COREG) tablet 3.125 mg (3.125 mg Oral Given 12/30/19 2206)  cefTRIAXone (ROCEPHIN) 1 g  in sodium chloride 0.9 % 100 mL IVPB (has no administration in time range)  vancomycin (VANCOCIN) IVPB 1000 mg/200 mL premix (0 mg Intravenous Stopped 12/30/19 1856)  cefTRIAXone (ROCEPHIN) 2 g in sodium chloride 0.9 % 100 mL IVPB (0 g Intravenous Stopped 12/30/19 1735)  HYDROmorphone (DILAUDID) injection 1 mg (1 mg Intravenous Given 12/30/19 1658)  ondansetron (ZOFRAN) injection 4 mg (4 mg Intravenous Given 12/30/19 1658)  sodium chloride 0.9 % bolus 1,000 mL (0 mLs Intravenous Stopped 12/30/19 1838)  morphine 4 MG/ML injection 4 mg (4 mg Intravenous Given 12/30/19 1922)    Mobility walks

## 2019-12-30 NOTE — ED Notes (Signed)
Hospitalist at bedside 

## 2019-12-31 ENCOUNTER — Inpatient Hospital Stay (HOSPITAL_COMMUNITY)

## 2019-12-31 DIAGNOSIS — I5021 Acute systolic (congestive) heart failure: Secondary | ICD-10-CM

## 2019-12-31 DIAGNOSIS — R918 Other nonspecific abnormal finding of lung field: Secondary | ICD-10-CM

## 2019-12-31 DIAGNOSIS — F1911 Other psychoactive substance abuse, in remission: Secondary | ICD-10-CM

## 2019-12-31 LAB — COMPREHENSIVE METABOLIC PANEL
ALT: 16 U/L (ref 0–44)
AST: 14 U/L — ABNORMAL LOW (ref 15–41)
Albumin: 2.9 g/dL — ABNORMAL LOW (ref 3.5–5.0)
Alkaline Phosphatase: 64 U/L (ref 38–126)
Anion gap: 9 (ref 5–15)
BUN: 15 mg/dL (ref 6–20)
CO2: 23 mmol/L (ref 22–32)
Calcium: 8.7 mg/dL — ABNORMAL LOW (ref 8.9–10.3)
Chloride: 101 mmol/L (ref 98–111)
Creatinine, Ser: 0.82 mg/dL (ref 0.61–1.24)
GFR calc Af Amer: >60 mL/min (ref >60)
GFR calc non Af Amer: >60 mL/min (ref >60)
Glucose, Bld: 110 mg/dL — ABNORMAL HIGH (ref 70–99)
Potassium: 4.3 mmol/L (ref 3.5–5.1)
Sodium: 133 mmol/L — ABNORMAL LOW (ref 135–145)
Total Bilirubin: 0.6 mg/dL (ref 0.3–1.2)
Total Protein: 7.1 g/dL (ref 6.5–8.1)

## 2019-12-31 LAB — CBC WITH DIFFERENTIAL/PLATELET
Abs Immature Granulocytes: 0.06 10*3/uL (ref 0.00–0.07)
Basophils Absolute: 0.1 10*3/uL (ref 0.0–0.1)
Basophils Relative: 0 %
Eosinophils Absolute: 0 10*3/uL (ref 0.0–0.5)
Eosinophils Relative: 0 %
HCT: 38.3 % — ABNORMAL LOW (ref 39.0–52.0)
Hemoglobin: 12.7 g/dL — ABNORMAL LOW (ref 13.0–17.0)
Immature Granulocytes: 1 %
Lymphocytes Relative: 16 %
Lymphs Abs: 2 10*3/uL (ref 0.7–4.0)
MCH: 28 pg (ref 26.0–34.0)
MCHC: 33.2 g/dL (ref 30.0–36.0)
MCV: 84.4 fL (ref 80.0–100.0)
Monocytes Absolute: 1.1 10*3/uL — ABNORMAL HIGH (ref 0.1–1.0)
Monocytes Relative: 9 %
Neutro Abs: 8.9 10*3/uL — ABNORMAL HIGH (ref 1.7–7.7)
Neutrophils Relative %: 74 %
Platelets: 337 10*3/uL (ref 150–400)
RBC: 4.54 MIL/uL (ref 4.22–5.81)
RDW: 13.3 % (ref 11.5–15.5)
WBC: 12.1 10*3/uL — ABNORMAL HIGH (ref 4.0–10.5)
nRBC: 0 % (ref 0.0–0.2)

## 2019-12-31 LAB — MRSA PCR SCREENING: MRSA by PCR: POSITIVE — AB

## 2019-12-31 LAB — MAGNESIUM: Magnesium: 1.8 mg/dL (ref 1.7–2.4)

## 2019-12-31 LAB — ECHOCARDIOGRAM COMPLETE
Height: 72 in
Weight: 2560 oz

## 2019-12-31 LAB — LACTIC ACID, PLASMA
Lactic Acid, Venous: 1.6 mmol/L (ref 0.5–1.9)
Lactic Acid, Venous: 1.2 mmol/L (ref 0.5–1.9)

## 2019-12-31 LAB — PROCALCITONIN: Procalcitonin: 0.26 ng/mL

## 2019-12-31 LAB — HIV ANTIBODY (ROUTINE TESTING W REFLEX): HIV Screen 4th Generation wRfx: NONREACTIVE

## 2019-12-31 MED ORDER — ADULT MULTIVITAMIN W/MINERALS CH
1.0000 | ORAL_TABLET | Freq: Every day | ORAL | Status: DC
  Administered 2019-12-31 – 2020-01-08 (×8): 1 via ORAL
  Filled 2019-12-31 (×8): qty 1

## 2019-12-31 MED ORDER — ENSURE ENLIVE PO LIQD
237.0000 mL | Freq: Two times a day (BID) | ORAL | Status: DC
  Administered 2020-01-02 – 2020-01-04 (×3): 237 mL via ORAL

## 2019-12-31 MED ORDER — OXYCODONE HCL ER 15 MG PO T12A
15.0000 mg | EXTENDED_RELEASE_TABLET | Freq: Two times a day (BID) | ORAL | Status: DC
  Administered 2019-12-31 – 2020-01-04 (×10): 15 mg via ORAL
  Filled 2019-12-31 (×10): qty 1

## 2019-12-31 MED ORDER — VANCOMYCIN HCL 1500 MG/300ML IV SOLN
1500.0000 mg | Freq: Two times a day (BID) | INTRAVENOUS | Status: DC
  Administered 2019-12-31 – 2020-01-04 (×9): 1500 mg via INTRAVENOUS
  Filled 2019-12-31 (×11): qty 300

## 2019-12-31 MED ORDER — HYDROMORPHONE HCL 1 MG/ML IJ SOLN
1.0000 mg | INTRAMUSCULAR | Status: DC | PRN
  Administered 2019-12-31 – 2020-01-04 (×26): 1 mg via INTRAVENOUS
  Filled 2019-12-31 (×27): qty 1

## 2019-12-31 MED ORDER — MUPIROCIN 2 % EX OINT
1.0000 "application " | TOPICAL_OINTMENT | Freq: Two times a day (BID) | CUTANEOUS | Status: AC
  Administered 2019-12-31 – 2020-01-04 (×9): 1 via NASAL
  Filled 2019-12-31 (×2): qty 22

## 2019-12-31 MED ORDER — PRO-STAT SUGAR FREE PO LIQD
30.0000 mL | Freq: Two times a day (BID) | ORAL | Status: DC
  Administered 2019-12-31 – 2020-01-04 (×3): 30 mL via ORAL
  Filled 2019-12-31 (×8): qty 30

## 2019-12-31 MED ORDER — CHLORHEXIDINE GLUCONATE CLOTH 2 % EX PADS
6.0000 | MEDICATED_PAD | Freq: Every day | CUTANEOUS | Status: DC
  Administered 2019-12-31 – 2020-01-01 (×2): 6 via TOPICAL

## 2019-12-31 NOTE — Progress Notes (Signed)
Initial Nutrition Assessment  DOCUMENTATION CODES:   Not applicable  INTERVENTION:  Ensure Enlive po BID, each supplement provides 350 kcal and 20 grams of protein  Prostat 30 ml po BID, each supplement provides 100 kcal and 15 grams of protein  MVI with minerals daily  NUTRITION DIAGNOSIS:   Increased nutrient needs related to acute illness, chronic illness (persisting LUE cellulitis and DVT; sCHF) as evidenced by estimated needs.   GOAL:   Patient will meet greater than or equal to 90% of their needs    MONITOR:   Labs, Skin, Supplement acceptance, Weight trends, PO intake  REASON FOR ASSESSMENT:   Malnutrition Screening Tool    ASSESSMENT:  RD working remotely.  34 year old male with past medical history of IV drug abuse (Heroin, Fentanyl, Cocaine), ischemic cardiomyopathy s/p MI with drug-eluting stent in 2017, sCHF (EF 25% in 2017), HTN, HLD, recent [facility] hospitalization in 04/21 for cellulitis of chin and LUE with LUE DVT as well as group B strep sepsis, noted patient left AMA prior to completion of treatment. Patient was incarcerated 8 days ago and presented via officer escort with 4 day history of LUE pain, fevers, poor appetite and generalized weakness.  In ED patient admits to continued injection of various drugs in left arm, last injection 8 days ago. He has been admitted for persisting left upper extremity cellulitis and suspected persisting DVT.   Patient does not have a phone in room, unable to contact patient at this time to obtain nutrition history. Per flowsheets, he consumed 100% of breakfast meal this morning. Will provide Ensure and Prostat supplements to aid with meeting needs.   Current wt 159.72 lb Limited wt history for review, per care everywhere he weighed 176.66 lb on 11/17/19 at [facility]. This indicates a 16.94 lb (9.6%) wt loss in the past 6 weeks which is significant. Given this as well as history of IV drug use, and CHF highly suspect malnutrition,  however unable to identify at this time.  Per notes: -follow blood cultures -xray of LUE with no evidence of osteomyelitis -Echo pending  Medications reviewed and include:Zestril, Oxycodone IVPB: Rocephin, Vancomycin Labs: Na 133 (L)  NUTRITION - FOCUSED PHYSICAL EXAM: Unable to complete at this time, RD working remotely.  Diet Order:   Diet Order            Diet Heart Room service appropriate? Yes; Fluid consistency: Thin  Diet effective now                 EDUCATION NEEDS:   No education needs have been identified at this time  Skin:  Skin Assessment: Skin Integrity Issues: Skin Integrity Issues:: Other (Comment) Other: Cellulitis;L arm  Last BM:  unknown  Height:   Ht Readings from Last 1 Encounters:  12/30/19 6' (1.829 m)    Weight:   Wt Readings from Last 1 Encounters:  12/30/19 72.6 kg    Ideal Body Weight:  80.9 kg  BMI:  Body mass index is 21.7 kg/m.  Estimated Nutritional Needs:   Kcal:  2200-2400  Protein:  115-125  Fluid:  >/= 2.2 L/day   Lars Masson, RD, LDN Clinical Nutrition After Hours/Weekend Pager # in Amion

## 2019-12-31 NOTE — Progress Notes (Signed)
Echocardiogram 2D Echocardiogram has been performed.  Shaun [name] 12/31/2019, 9:14 AM

## 2019-12-31 NOTE — Progress Notes (Signed)
Pharmacy Antibiotic Note  Shaun [name] is a 34 y.o. male admitted on 12/30/2019 with cellulitis.  Pharmacy has been consulted for vancomycin dosing.  Plan: Vancomycin 1500mg  IV q12h (trough goal 10-15) Follow renal function and clinical course   Height: 6' (182.9 cm) Weight: 72.6 kg (160 lb) IBW/kg (Calculated) : 77.6  Temp (24hrs), Avg:99.5 F (37.5 C), Min:98.4 F (36.9 C), Max:101.9 F (38.8 C)  Recent Labs  Lab 12/30/19 1208 12/31/19 0359  WBC 11.1* 12.1*  CREATININE 1.03 0.82  LATICACIDVEN 1.7  --     Estimated Creatinine Clearance: 130.3 mL/min (by C-G formula based on SCr of 0.82 mg/dL).    Allergies  Allergen Reactions  . Codeine Itching  . Penicillins     Itching Did it involve swelling of the face/tongue/throat, SOB, or low BP? SOB & Swelling Which Caused Blood Clots Did it involve sudden or severe rash/hives, skin peeling, or any reaction on the inside of your mouth or nose? No Did you need to seek medical attention at a hospital or doctor's office? Y When did it last happen?Over month ago If all above answers are "NO", may proceed with cephalosporin use.      Antimicrobials this admission: 6/11 ceftriaxone >> 6/11 vanc >> Dose adjustments this admission:   Microbiology results: 6/11 BCx:  6/11 MRSA PCR: positive  Thank you for allowing pharmacy to be a part of this patient's care.  8/11 RPh 12/31/2019, 10:14 AM

## 2019-12-31 NOTE — Progress Notes (Addendum)
PROGRESS NOTE    Tuvia Woodrick Gomes  QIO:[id number] DOB: 01-31-1986 DOA: 12/30/2019 PCP: Patient, No Pcp Per   Brief Narrative: HPI per Dr. Cyd Silence 34 year old male with past medical history of intravenous drug abuse (Heroin, Fentanyl, Cocaine), ischemic cardiomyopathy status post MI with drug-eluting stent in 3244, systolic congestive heart failure (echo 2017 with EF 25%), hypertension, hyperlipidemia who presents to [facility] emergency department complaints of left upper extremity pain.  Of note, patient was hospitalized at [facility] in April 2021.  During that hospitalization patient was found to have cellulitis of the chin and left upper extremity with concurrent left upper extremity DVT.  Patient was also found to have group B strep sepsis.  Patient was initially treated with anticoagulation and intravenous antibiotic therapy however patient ended up leaving AMA prior to completion of treatment.  Patient left without receiving any home-going anticoagulation or antibiotics.  Patient was incarcerated 8 days ago.  Patient explains that approximately 4 days ago the pain in his left upper extremity began to worsen.  Pain begins in the forearm and radiates proximally.  Pain is 10 out of 10 in intensity, burning to sharp in quality, worse with movement of the left upper extremity.  Patient also complains of poor appetite and generalized weakness.  Patient is also complaining of subjective fevers.  Patient admits to continued injection of various drugs including fentanyl even in that arm with last injection 8 days ago.  Patient was brought in to [facility] emergency department for evaluation and is being escorted by an Garment/textile technologist.  Upon evaluation in the emergency department patient is clinically felt to be suffering from persisting left upper extremity cellulitis and suspected persisting left upper extremity DVT.  Patient was provided with Eliquis 10 mg, ceftriaxone  2 g and 1 g of vancomycin.  Patient was also provided with 1 L of normal saline.  Blood cultures were obtained.  The hospitalist group was then called to assess the patient for admission the hospital.  Assessment & Plan:   Active Problems:   Coronary artery disease involving native coronary artery of native heart without angina pectoris   Essential hypertension   Mixed hyperlipidemia   Cellulitis of left upper extremity   Acute deep vein thrombosis (DVT) of left upper extremity (HCC)   Nicotine dependence, cigarettes, uncomplicated   Intravenous drug abuse (HCC)   Chronic systolic CHF (congestive heart failure) ([place])   #1 left upper extremity cellulitis/SIRS- with DVT in the setting of incomplete treatment with IV antibiotics when patient left AGAINST MEDICAL ADVICE in April 2021 from [facility].  Patient started on Rocephin per cellulitis order set.  Follow-up blood cultures. MRSA PCR Patient in 10 out of 10 excruciating pain in the left upper extremity. Change his pain management to long-acting OxyContin 15 twice daily with Dilaudid 1 mg every 3 as needed for breakthrough pain during his hospital stay. X-ray of the left upper extremity with no evidence of osteomyelitis. Patient presented with tachypnea tachycardia and febrile on admission with a temp of 101.9 with leukocytosis. Check lactic acid.  And procalcitonin.  #2 left upper extremity DVT continue Eliquis.  #3 ischemic cardiomyopathy with EF of 25% on aspirin, Coreg, and lisinopril, atorvastatin.  Patient does not appear volume overloaded at this time.  Echo pending.  #4 history of essential hypertension on Coreg and lisinopril.  Blood pressure stable  #5 history of mixed hyperlipidemia on atorvastatin  #6 history of IV drug use with history of cocaine  heroin and fentanyl use.  Last use was 8 days ago due to incarceration.  #7 history of tobacco use start nicotine patch  Estimated body mass index is 21.7 kg/m as  calculated from the following:   Height as of this encounter: 6' (1.829 m).   Weight as of this encounter: 72.6 kg.  DVT prophylaxis: Eliquis  code Status: Full code Family Communication: Discussed with patient disposition Plan:  Status is: Inpatient  Dispo: The patient is from: [place]              Anticipated d/c is to: [place]              Anticipated d/c date is: Unknown              Patient currently is not medically stable to d/c.   Consultants: None   Procedures: None Antimicrobials: Rocephin  Subjective: Patient reports 10 out of 10 left upper extremity pain Officer in the room  Objective: Vitals:   12/30/19 2200 12/30/19 2323 12/31/19 0044 12/31/19 0446  BP: 130/71 121/69 118/68 123/89  Pulse: (!) 118 (!) 113 (!) 102 99  Resp: (!) 30 16 (!) 21 17  Temp:  (!) 101.9 F (38.8 C) 99.1 F (37.3 C) 98.5 F (36.9 C)  TempSrc:  Oral Oral Oral  SpO2: 95% 94% 96% 96%  Weight:      Height:        Intake/Output Summary (Last 24 hours) at 12/31/2019 0946 Last data filed at 12/30/2019 1856 Gross per 24 hour  Intake 1300 ml  Output --  Net 1300 ml   Filed Weights   12/30/19 1950  Weight: 72.6 kg    Examination:  General exam: Appears calm and comfortable  Respiratory system: Clear to auscultation. Respiratory effort normal. Cardiovascular system: S1 & S2 heard, RRR. No JVD, murmurs, rubs, gallops or clicks. No pedal edema. Gastrointestinal system: Abdomen is nondistended, soft and nontender. No organomegaly or masses felt. Normal bowel sounds heard. Central nervous system: Alert and oriented. No focal neurological deficits. Extremities swollen left upper extremity all the way up to the left elbow tender and erythematous Skin: No rashes, lesions or ulcers Psychiatry: Judgement and insight appear normal. Mood & affect appropriate.     Data Reviewed: I have personally reviewed following labs and imaging studies  CBC: Recent Labs  Lab 12/30/19 1208 12/31/19 0359   WBC 11.1* 12.1*  NEUTROABS 8.7* 8.9*  HGB 13.9 12.7*  HCT 42.6 38.3*  MCV 84.5 84.4  PLT 363 337   Basic Metabolic Panel: Recent Labs  Lab 12/30/19 1208 12/31/19 0359  NA 132* 133*  K 4.0 4.3  CL 95* 101  CO2 26 23  GLUCOSE 119* 110*  BUN 15 15  CREATININE 1.03 0.82  CALCIUM 8.9 8.7*  MG  --  1.8   GFR: Estimated Creatinine Clearance: 130.3 mL/min (by C-G formula based on SCr of 0.82 mg/dL). Liver Function Tests: Recent Labs  Lab 12/30/19 1208 12/31/19 0359  AST 16 14*  ALT 19 16  ALKPHOS 84 64  BILITOT 0.8 0.6  PROT 8.8* 7.1  ALBUMIN 3.7 2.9*   No results for input(s): LIPASE, AMYLASE in the last 168 hours. No results for input(s): AMMONIA in the last 168 hours. Coagulation Profile: No results for input(s): INR, PROTIME in the last 168 hours. Cardiac Enzymes: No results for input(s): CKTOTAL, CKMB, CKMBINDEX, TROPONINI in the last 168 hours. BNP (last 3 results) No results for input(s): PROBNP in the last 8760 hours.  HbA1C: No results for input(s): HGBA1C in the last 72 hours. CBG: No results for input(s): GLUCAP in the last 168 hours. Lipid Profile: No results for input(s): CHOL, HDL, LDLCALC, TRIG, CHOLHDL, LDLDIRECT in the last 72 hours. Thyroid Function Tests: No results for input(s): TSH, T4TOTAL, FREET4, T3FREE, THYROIDAB in the last 72 hours. Anemia Panel: No results for input(s): VITAMINB12, FOLATE, FERRITIN, TIBC, IRON, RETICCTPCT in the last 72 hours. Sepsis Labs: Recent Labs  Lab 12/30/19 1208  LATICACIDVEN 1.7    Recent Results (from the past 240 hour(s))  Blood culture (routine x 2)     Status: None (Preliminary result)   Collection Time: 12/30/19  4:04 PM   Specimen: BLOOD  Result Value Ref Range Status   Specimen Description   Final    BLOOD RIGHT ARM Performed at [facility], [address] [place], [place] [address]    Special Requests   Final    BOTTLES DRAWN AEROBIC AND ANAEROBIC Blood Culture adequate  volume Performed at [facility], [address] [place], [place] [address]    Culture   Final    NO GROWTH < 12 HOURS Performed at [facility], [address] [place], [place] [address]    Report Status PENDING  Incomplete  SARS Coronavirus 2 by RT PCR (hospital order, performed in [facility] hospital lab) Nasopharyngeal Nasopharyngeal Swab     Status: None   Collection Time: 12/30/19  7:47 PM   Specimen: Nasopharyngeal Swab  Result Value Ref Range Status   SARS Coronavirus 2 NEGATIVE NEGATIVE Final    Comment: (NOTE) SARS-CoV-2 target nucleic acids are NOT DETECTED.  The SARS-CoV-2 RNA is generally detectable in upper and lower respiratory specimens during the acute phase of infection. The lowest concentration of SARS-CoV-2 viral copies this assay can detect is 250 copies / mL. A negative result does not preclude SARS-CoV-2 infection and should not be used as the sole basis for treatment or other patient management decisions.  A negative result may occur with improper specimen collection / handling, submission of specimen other than nasopharyngeal swab, presence of viral mutation(s) within the areas targeted by this assay, and inadequate number of viral copies (<250 copies / mL). A negative result must be combined with clinical observations, patient history, and epidemiological information.  Fact Sheet for Patients:   BoilerBrush.com.cy  Fact Sheet for Healthcare Providers: https://pope.com/  This test is not yet approved or  cleared by the [place] FDA and has been authorized for detection and/or diagnosis of SARS-CoV-2 by FDA under an Emergency Use Authorization (EUA).  This EUA will remain in effect (meaning this test can be used) for the duration of the COVID-19 declaration under Section 564(b)(1) of the Act, 21 U.S.C. section 360bbb-3(b)(1), unless the authorization is terminated or revoked  sooner.  Performed at [facility], [address] [place], [place] [address]   MRSA PCR Screening     Status: Abnormal   Collection Time: 12/30/19 11:57 PM   Specimen: Nasal Mucosa; Nasopharyngeal  Result Value Ref Range Status   MRSA by PCR POSITIVE (A) NEGATIVE Final    Comment:        The GeneXpert MRSA Assay (FDA approved for NASAL specimens only), is one component of a comprehensive MRSA colonization surveillance program. It is not intended to diagnose MRSA infection nor to guide or monitor treatment for MRSA infections. RESULT CALLED TO, READ BACK BY AND VERIFIED WITH: VERNER, M. @ [phone] 12/31/2019  Kizzie Furnish Performed at [facility], [address], [place], [place] [address]          Radiology Studies: DG Chest 1 View  Result Date: 12/30/2019 CLINICAL DATA:  History of IVDA with pain and swelling in the left upper extremity EXAM: CHEST  1 VIEW COMPARISON:  11/15/2018 FINDINGS: Cardiac shadow is stable. Coronary stenting is noted. Lungs are well aerated bilaterally. No focal infiltrate or sizable effusion is seen. No bony abnormality is noted. IMPRESSION: No acute abnormality seen. Electronically Signed   By: Alcide Clever M.D.   On: 12/30/2019 20:27   DG Forearm Left  Result Date: 12/30/2019 CLINICAL DATA:  Pain in LEFT forearm unable to rotate arm into full position. EXAM: LEFT FOREARM - 2 VIEW COMPARISON:  RIGHT wrist of 02/07/2019 FINDINGS: Extensive soft tissue swelling about the LEFT forearm. No gas in the soft tissues by plain film radiography. No sign of fracture or dislocation. IMPRESSION: Marked soft tissue swelling about the LEFT forearm. No acute osseous abnormality. Correlate with any signs of infection. Electronically Signed   By: Donzetta Kohut M.D.   On: 12/30/2019 14:07        Scheduled Meds: . apixaban  10 mg Oral BID   Followed by  . [START ON 01/06/2020] apixaban  5 mg Oral BID  . aspirin EC  81 mg Oral  Daily  . atorvastatin  40 mg Oral q1800  . carvedilol  3.125 mg Oral BID WC  . Chlorhexidine Gluconate Cloth  6 each Topical Q0600  . lisinopril  10 mg Oral Daily  . mupirocin ointment  1 application Nasal BID  . nicotine  21 mg Transdermal Daily   Continuous Infusions: . cefTRIAXone (ROCEPHIN)  IV       LOS: 1 day     Alwyn Ren, MD  12/31/2019, 9:46 AM

## 2020-01-01 ENCOUNTER — Inpatient Hospital Stay (HOSPITAL_COMMUNITY)

## 2020-01-01 DIAGNOSIS — I82409 Acute embolism and thrombosis of unspecified deep veins of unspecified lower extremity: Secondary | ICD-10-CM

## 2020-01-01 LAB — COMPREHENSIVE METABOLIC PANEL
ALT: 13 U/L (ref 0–44)
AST: 16 U/L (ref 15–41)
Albumin: 2.8 g/dL — ABNORMAL LOW (ref 3.5–5.0)
Alkaline Phosphatase: 64 U/L (ref 38–126)
Anion gap: 11 (ref 5–15)
BUN: 18 mg/dL (ref 6–20)
CO2: 21 mmol/L — ABNORMAL LOW (ref 22–32)
Calcium: 8.3 mg/dL — ABNORMAL LOW (ref 8.9–10.3)
Chloride: 101 mmol/L (ref 98–111)
Creatinine, Ser: 0.86 mg/dL (ref 0.61–1.24)
GFR calc Af Amer: >60 mL/min (ref >60)
GFR calc non Af Amer: >60 mL/min (ref >60)
Glucose, Bld: 112 mg/dL — ABNORMAL HIGH (ref 70–99)
Potassium: 4.3 mmol/L (ref 3.5–5.1)
Sodium: 133 mmol/L — ABNORMAL LOW (ref 135–145)
Total Bilirubin: 0.4 mg/dL (ref 0.3–1.2)
Total Protein: 7.3 g/dL (ref 6.5–8.1)

## 2020-01-01 LAB — CBC
HCT: 38.4 % — ABNORMAL LOW (ref 39.0–52.0)
Hemoglobin: 12.3 g/dL — ABNORMAL LOW (ref 13.0–17.0)
MCH: 27.5 pg (ref 26.0–34.0)
MCHC: 32 g/dL (ref 30.0–36.0)
MCV: 85.7 fL (ref 80.0–100.0)
Platelets: 353 10*3/uL (ref 150–400)
RBC: 4.48 MIL/uL (ref 4.22–5.81)
RDW: 13.3 % (ref 11.5–15.5)
WBC: 8.2 10*3/uL (ref 4.0–10.5)
nRBC: 0 % (ref 0.0–0.2)

## 2020-01-01 LAB — MAGNESIUM: Magnesium: 2.1 mg/dL (ref 1.7–2.4)

## 2020-01-01 LAB — PROCALCITONIN: Procalcitonin: 0.17 ng/mL

## 2020-01-01 MED ORDER — SODIUM CHLORIDE (PF) 0.9 % IJ SOLN
INTRAMUSCULAR | Status: AC
  Filled 2020-01-01: qty 50

## 2020-01-01 MED ORDER — IOHEXOL 300 MG/ML  SOLN
100.0000 mL | Freq: Once | INTRAMUSCULAR | Status: AC | PRN
  Administered 2020-01-01: 100 mL via INTRAVENOUS

## 2020-01-01 MED ORDER — MAGNESIUM SULFATE 2 GM/50ML IV SOLN
2.0000 g | Freq: Once | INTRAVENOUS | Status: AC
  Administered 2020-01-01: 2 g via INTRAVENOUS
  Filled 2020-01-01: qty 50

## 2020-01-01 NOTE — Consult Note (Signed)
ORTHOPAEDIC CONSULTATION  REQUESTING PHYSICIAN: Alwyn Ren, MD  Chief Complaint: left arm pain  HPI: Shaun [name] is a 34 y.o. male with history of IV drug use, ischemic cardiomyopathy s/p MI with drug-eluting stent, systolic CHF, HTN, hyperlipidemia. Patient also has recent history of hospitalization in April 2021 for chin and LUE cellulitis with LUE DVT and sepsis, patient left AMA prior to completion of treatment. Patient was incarcerated 12/22/19 and 12/26/19 noted increased pain in LUE, he was brought to the [facility] ED by officer for evaluation. Patient currently being treated for LUE cellulities and LUE DVT. CT of LUE was performed to rule out underlying abscess as patient was unable to flex left wrist or wiggle fingers on exam. CT of LUE showed a 1.7 cm curvilinear rim enhancing fluid collection adjacent to the occluded cephalic vein in the mid to distal forearm, concerning for small abscess and orthopedics was consulted.   On exam patient states pain is 10/10 in left forearm, worse with any movement or pressure, mildly better with rest. He feels like pain and swelling have gotten significantly worse over last 24 hours. Feels "hot and sweaty" but denies chest pain, shortness of breath, abdominal pain, nausea, or vomiting.   Past Medical History:  Diagnosis Date   Beta-hemolytic group B streptococcal sepsis (HCC) 10/2019   at [facility]   Chronic systolic CHF (congestive heart failure) (HCC) 12/30/2019   Coronary artery disease involving native coronary artery of native heart without angina pectoris 12/30/2019   Essential hypertension 12/30/2019   Hypertension    Intravenous drug abuse (HCC) 12/30/2019   Ischemic cardiomyopathy 12/30/2019   Mixed hyperlipidemia 12/30/2019   Nicotine dependence, cigarettes, uncomplicated 12/30/2019   History reviewed. No pertinent surgical history. Social History   Socioeconomic History   Marital status: Single    Spouse name: Not on file    Number of children: Not on file   Years of education: Not on file   Highest education level: Not on file  Occupational History   Not on file  Tobacco Use   Smoking status: Current Every Day Smoker    Packs/day: 1.00    Types: Cigarettes   Smokeless tobacco: Never Used  Substance and Sexual Activity   Alcohol use: No   Drug use: Yes    Types: IV    Comment: opiates   Sexual activity: Not on file  Other Topics Concern   Not on file  Social History Narrative   Not on file   Social Determinants of Health   Financial Resource Strain:    Difficulty of Paying Living Expenses:   Food Insecurity:    Worried About Programme researcher, broadcasting/film/video in the Last Year:    Barista in the Last Year:   Transportation Needs:    Freight forwarder (Medical):    Lack of Transportation (Non-Medical):   Physical Activity:    Days of Exercise per Week:    Minutes of Exercise per Session:   Stress:    Feeling of Stress :   Social Connections:    Frequency of Communication with Friends and Family:    Frequency of Social Gatherings with Friends and Family:    Attends Religious Services:    Active Member of Clubs or Organizations:    Attends Banker Meetings:    Marital Status:    Family History  Family history unknown: Yes   Allergies  Allergen Reactions   Codeine Itching   Penicillins  Itching Did it involve swelling of the face/tongue/throat, SOB, or low BP? SOB & Swelling Which Caused Blood Clots Did it involve sudden or severe rash/hives, skin peeling, or any reaction on the inside of your mouth or nose? No Did you need to seek medical attention at a hospital or doctor's office? Y When did it last happen?Over month ago If all above answers are "NO", may proceed with cephalosporin use.       Positive ROS: All other systems have been reviewed and were otherwise negative with the exception of those mentioned in the HPI and as  above.  Physical Exam: General: Alert, sitting up in bed, no acute distress. Cardiovascular: No pedal edema Respiratory: No cyanosis, no use of accessory musculature GI: No organomegaly, abdomen is soft and non-tender Skin: Small wound at left elbow and anterior left wrist. Significant erythema of left forearm and hand with increased erythema on distal radial side of left forearm. Neurologic: Sensation intact distally Psychiatric: Patient is competent for consent with normal mood and affect Lymphatic: No axillary or cervical lymphadenopathy  MUSCULOSKELETAL: Significant edema to dorsal aspect of left hand, all fingers, and left distal forearm. Erythema of left forearm and hand with increased erythema on distal radial side of left forearm, please see photos below. Sensation intact in all fingers as well as dorsal and palmar aspects of hand. Able to flex and extend all fingers however ROM limited by pain. No AROM at wrist due to pain. TTP to whole forearm however worse on radial aspect of distal forearm. This area is firm to palpation, no fluctuance that I can appreciate.           Assessment/Plan: Active Problems:   Coronary artery disease involving native coronary artery of native heart without angina pectoris   Essential hypertension   Mixed hyperlipidemia   Cellulitis of left upper extremity   Acute deep vein thrombosis (DVT) of left upper extremity (HCC)   Nicotine dependence, cigarettes, uncomplicated   Intravenous drug abuse (HCC)   Chronic systolic CHF (congestive heart failure) ([place])   History of intravenous drug abuse ([place])   Lung field abnormal finding on examination  Left upper extremity cellulitis and small fluid collection found on CT - plan for aspiration of fluid collection by IR with gram stain and cultures -  If patient continues to not improved clinically after aspiration, will possibly wash out in OR with Dr. Percell Miller on Tuesday - will continue to follow,  watching for clinical improvement after aspiration  Ventura Bruns, PA-C   01/01/2020 4:17 PM

## 2020-01-01 NOTE — Progress Notes (Signed)
Upper venous duplex       has been completed. Preliminary results can be found under CV proc through chart review. Ram Haugan, BS, RDMS, RVT   

## 2020-01-01 NOTE — Discharge Instructions (Signed)
Information on my medicine - ELIQUIS (apixaban)  This medication education was reviewed with me or my healthcare representative as part of my discharge preparation.    Why was Eliquis prescribed for you? Eliquis was prescribed to treat blood clots that may have been found in the veins of your legs (deep vein thrombosis) or in your lungs (pulmonary embolism) and to reduce the risk of them occurring again.  What do You need to know about Eliquis ? The starting dose is 10 mg (two 5 mg tablets) taken TWICE daily for the FIRST SEVEN (7) DAYS, then on 01/06/20  the dose is reduced to ONE 5 mg tablet taken TWICE daily.  Eliquis may be taken with or without food.   Try to take the dose about the same time in the morning and in the evening. If you have difficulty swallowing the tablet whole please discuss with your pharmacist how to take the medication safely.  Take Eliquis exactly as prescribed and DO NOT stop taking Eliquis without talking to the doctor who prescribed the medication.  Stopping may increase your risk of developing a new blood clot.  Refill your prescription before you run out.  After discharge, you should have regular check-up appointments with your healthcare provider that is prescribing your Eliquis.    What do you do if you miss a dose? If a dose of ELIQUIS is not taken at the scheduled time, take it as soon as possible on the same day and twice-daily administration should be resumed. The dose should not be doubled to make up for a missed dose.  Important Safety Information A possible side effect of Eliquis is bleeding. You should call your healthcare provider right away if you experience any of the following: ? Bleeding from an injury or your nose that does not stop. ? Unusual colored urine (red or dark brown) or unusual colored stools (red or black). ? Unusual bruising for unknown reasons. ? A serious fall or if you hit your head (even if there is no bleeding).  Some  medicines may interact with Eliquis and might increase your risk of bleeding or clotting while on Eliquis. To help avoid this, consult your healthcare provider or pharmacist prior to using any new prescription or non-prescription medications, including herbals, vitamins, non-steroidal anti-inflammatory drugs (NSAIDs) and supplements.  This website has more information on Eliquis (apixaban): http://www.eliquis.com/eliquis/home

## 2020-01-01 NOTE — Progress Notes (Addendum)
PROGRESS NOTE    Shaun [name]  BOF:[id number] DOB: 12-11-1985 DOA: 12/30/2019 PCP: Patient, No Pcp Per  Brief Narrative:HPI per Dr. Leafy Half 34 year old male with past medical history of intravenous drug abuse(Heroin, Fentanyl, Cocaine), ischemic cardiomyopathy status post MI with drug-eluting stent in 2017, systolic congestive heart failure(echo 2017 with EF 25%),hypertension, hyperlipidemia who presents to [facility] emergency department complaints of left upper extremity pain.  Of note, patient was hospitalized at [facility] in April 2021. During that hospitalization patient was found to have cellulitis of the chin and left upper extremity with concurrent left upper extremity DVT. Patient was also found to have group B strep sepsis. Patient was initially treated with anticoagulation and intravenous antibiotic therapy however patient ended up leaving AMA prior to completion of treatment. Patient left without receiving anyhome-going anticoagulation or antibiotics.  Patient was incarcerated 8 days ago.  Patient explains that approximately 4 days ago the pain in his left upper extremity began to worsen. Pain begins in the forearm and radiates proximally. Pain is 10 out of 10 in intensity, burning to sharp in quality, worse with movement of the left upper extremity. Patient also complains of poor appetite and generalized weakness. Patient is also complaining of subjective fevers. Patient admits to continued injection of various drugs including fentanyl even in that arm with last injection 8 days ago.  Patient was brought in to [facility] emergency department for evaluation and is being escorted by an Technical sales engineer. Upon evaluation in the emergency department patient is clinically felt to be suffering from persisting left upper extremity cellulitis and suspected persisting left upper extremity DVT. Patient was provided with Eliquis 10 mg, ceftriaxone 2 g  and 1 g of vancomycin. Patient was also provided with 1 L of normal saline. Blood cultures were obtained. The hospitalist group was then called to assess the patient for admission the hospital.  Assessment & Plan:   Active Problems:   Coronary artery disease involving native coronary artery of native heart without angina pectoris   Essential hypertension   Mixed hyperlipidemia   Cellulitis of left upper extremity   Acute deep vein thrombosis (DVT) of left upper extremity (HCC)   Nicotine dependence, cigarettes, uncomplicated   Intravenous drug abuse (HCC)   Chronic systolic CHF (congestive heart failure) (HCC)   History of intravenous drug abuse (HCC)   Lung field abnormal finding on examination    #1 left upper extremity cellulitis/SIRS- with DVT in the setting of incomplete treatment with IV antibiotics when patient left AGAINST MEDICAL ADVICE in April 2021 from [facility].  Patient started on Rocephin per cellulitis order set.  Follow-up blood cultures. MRSA PCR positive  Added vancomycin. Patient in 10 out of 10 excruciating pain in the left upper extremity. Change his pain management to long-acting OxyContin 15 twice daily with Dilaudid 1 mg every 3 as needed for breakthrough pain during his hospital stay. X-ray of the left upper extremity with no evidence of osteomyelitis. Patient presented with tachypnea tachycardia and febrile on admission with a temp of 101.9 with leukocytosis.  lactic acid 1.7, 1.6, 1.2   procalcitonin is 0.17. Check  Ct LUE-1.7 cm abscess.IR consulted.Ortho consulted   #2 left upper extremity DVT continue Eliquis.  Doppler showed no evidence of DVT but has superficial thrombophlebitis.  Continue Eliquis for a total of 3 months.  #3 ischemic cardiomyopathy with EF of 25% on aspirin, Coreg, and lisinopril, atorvastatin.  Patient does not appear volume overloaded at this time. ECHO  12/31/19-Diffuse hypokinesis worse in septum Old echo from [facility] not  available  but has had EF described in 25% range 2017 . Left ventricular ejection  fraction, by estimation, is 25 to 30%. The left ventricle has severely decreased function. The left ventricle  demonstrates global hypokinesis. The left ventricular internal cavity size  was severely dilated. Left ventricular diastolic parameters were normal.  Right ventricular systolic function is normal. The right ventricular size is normal.  The mitral valve is normal in structure. Mild mitral valve  regurgitation. No evidence of mitral stenosis.  The aortic valve is tricuspid. Aortic valve regurgitation is not  visualized. No aortic stenosis is present.  The inferior vena cava is dilated in size with >50% respiratory  variability, suggesting right atrial pressure of 8 mmHg.   #4 history of essential hypertension -BP 130/77.  Continue Coreg and lisinopril.    #5 history of mixed hyperlipidemia on atorvastatin  #6 history of IV drug use with history of cocaine heroin and fentanyl use.  Last use was 8 days ago due to incarceration.  #7 history of tobacco use start nicotine patch  #8 NSVT-check electrolytes today continue Coreg replete magnesium   Nutrition Problem: Increased nutrient needs Etiology: acute illness, chronic illness (persisting LUE cellulitis and DVT; sCHF)     Signs/Symptoms: estimated needs    Interventions: Ensure Enlive (each supplement provides 350kcal and 20 grams of protein), MVI, Prostat  Estimated body mass index is 21.7 kg/m as calculated from the following:   Height as of this encounter: 6' (1.829 m).   Weight as of this encounter: 72.6 kg.    DVT prophylaxis: Eliquis  code Status: Full code Family Communication: Discussed with patient disposition Plan:  Status is: Inpatient  Dispo: The patient is from: [facility]  Anticipated d/c is to: [facility]  Anticipated d/c date is: Unknown  Patient currently is not medically stable to  d/c.   Consultants: None   Procedures: None Antimicrobials: Rocephin   Subjective: Patient resting in bed still complains of a lot of pain in the left upper extremity.  Officer present. Patient had 7 beats of V. tach reported per telemetry He was asymptomatic  Objective: Vitals:   12/31/19 1618 12/31/19 1731 12/31/19 2027 01/01/20 0449  BP: 140/84 131/87 (!) 132/91 130/77  Pulse: 99 97 99 99  Resp:   18   Temp:  98.5 F (36.9 C) 98.5 F (36.9 C) 98.5 F (36.9 C)  TempSrc:  Oral Oral Oral  SpO2:  96% 97% 97%  Weight:      Height:        Intake/Output Summary (Last 24 hours) at 01/01/2020 0840 Last data filed at 01/01/2020 0300 Gross per 24 hour  Intake 2300.05 ml  Output 2 ml  Net 2298.05 ml   Filed Weights   12/30/19 1950  Weight: 72.6 kg    Examination:  General exam: Appears calm and comfortable  Respiratory system: Clear to auscultation. Respiratory effort normal. Cardiovascular system: S1 & S2 heard, RRR. No JVD, murmurs, rubs, gallops or clicks. No pedal edema. Gastrointestinal system: Abdomen is nondistended, soft and nontender. No organomegaly or masses felt. Normal bowel sounds heard. Central nervous system: Alert and oriented. No focal neurological deficits. Extremities: Left upper extremity swollen tender, erythema extending above the left elbow Skin: No rashes, lesions or ulcers Psychiatry: Judgement and insight appear normal. Mood & affect appropriate.     Data Reviewed: I have personally reviewed following labs and imaging studies  CBC: Recent Labs  Lab  12/30/19 1208 12/31/19 0359  WBC 11.1* 12.1*  NEUTROABS 8.7* 8.9*  HGB 13.9 12.7*  HCT 42.6 38.3*  MCV 84.5 84.4  PLT 363 337   Basic Metabolic Panel: Recent Labs  Lab 12/30/19 1208 12/31/19 0359  NA 132* 133*  K 4.0 4.3  CL 95* 101  CO2 26 23  GLUCOSE 119* 110*  BUN 15 15  CREATININE 1.03 0.82  CALCIUM 8.9 8.7*  MG  --  1.8   GFR: Estimated Creatinine Clearance: 130.3  mL/min (by C-G formula based on SCr of 0.82 mg/dL). Liver Function Tests: Recent Labs  Lab 12/30/19 1208 12/31/19 0359  AST 16 14*  ALT 19 16  ALKPHOS 84 64  BILITOT 0.8 0.6  PROT 8.8* 7.1  ALBUMIN 3.7 2.9*   No results for input(s): LIPASE, AMYLASE in the last 168 hours. No results for input(s): AMMONIA in the last 168 hours. Coagulation Profile: No results for input(s): INR, PROTIME in the last 168 hours. Cardiac Enzymes: No results for input(s): CKTOTAL, CKMB, CKMBINDEX, TROPONINI in the last 168 hours. BNP (last 3 results) No results for input(s): PROBNP in the last 8760 hours. HbA1C: No results for input(s): HGBA1C in the last 72 hours. CBG: No results for input(s): GLUCAP in the last 168 hours. Lipid Profile: No results for input(s): CHOL, HDL, LDLCALC, TRIG, CHOLHDL, LDLDIRECT in the last 72 hours. Thyroid Function Tests: No results for input(s): TSH, T4TOTAL, FREET4, T3FREE, THYROIDAB in the last 72 hours. Anemia Panel: No results for input(s): VITAMINB12, FOLATE, FERRITIN, TIBC, IRON, RETICCTPCT in the last 72 hours. Sepsis Labs: Recent Labs  Lab 12/30/19 1208 12/31/19 1032 12/31/19 1314 01/01/20 0531  PROCALCITON  --  0.26  --  0.17  LATICACIDVEN 1.7 1.6 1.2  --     Recent Results (from the past 240 hour(s))  Blood culture (routine x 2)     Status: None (Preliminary result)   Collection Time: 12/30/19  4:04 PM   Specimen: BLOOD  Result Value Ref Range Status   Specimen Description   Final    BLOOD RIGHT ARM Performed at [facility], [address], [place], [place] [address]    Special Requests   Final    BOTTLES DRAWN AEROBIC AND ANAEROBIC Blood Culture adequate volume Performed at [facility], [address] [place], [place] [address]    Culture   Final    NO GROWTH 2 DAYS Performed at [facility], [address] [place], [place] [address]    Report Status PENDING  Incomplete  SARS Coronavirus 2  by RT PCR (hospital order, performed in [facility] hospital lab) Nasopharyngeal Nasopharyngeal Swab     Status: None   Collection Time: 12/30/19  7:47 PM   Specimen: Nasopharyngeal Swab  Result Value Ref Range Status   SARS Coronavirus 2 NEGATIVE NEGATIVE Final    Comment: (NOTE) SARS-CoV-2 target nucleic acids are NOT DETECTED.  The SARS-CoV-2 RNA is generally detectable in upper and lower respiratory specimens during the acute phase of infection. The lowest concentration of SARS-CoV-2 viral copies this assay can detect is 250 copies / mL. A negative result does not preclude SARS-CoV-2 infection and should not be used as the sole basis for treatment or other patient management decisions.  A negative result may occur with improper specimen collection / handling, submission of specimen other than nasopharyngeal swab, presence of viral mutation(s) within the areas targeted by this assay, and inadequate number of viral copies (<250 copies /  mL). A negative result must be combined with clinical observations, patient history, and epidemiological information.  Fact Sheet for Patients:   BoilerBrush.com.cy  Fact Sheet for Healthcare Providers: https://pope.com/  This test is not yet approved or  cleared by the [place] FDA and has been authorized for detection and/or diagnosis of SARS-CoV-2 by FDA under an Emergency Use Authorization (EUA).  This EUA will remain in effect (meaning this test can be used) for the duration of the COVID-19 declaration under Section 564(b)(1) of the Act, 21 U.S.C. section 360bbb-3(b)(1), unless the authorization is terminated or revoked sooner.  Performed at [facility], [address] [place], [place] [address]   MRSA PCR Screening     Status: Abnormal   Collection Time: 12/30/19 11:57 PM   Specimen: Nasal Mucosa; Nasopharyngeal  Result Value Ref Range Status   MRSA by PCR  POSITIVE (A) NEGATIVE Final    Comment:        The GeneXpert MRSA Assay (FDA approved for NASAL specimens only), is one component of a comprehensive MRSA colonization surveillance program. It is not intended to diagnose MRSA infection nor to guide or monitor treatment for MRSA infections. RESULT CALLED TO, READ BACK BY AND VERIFIED WITH: Deon Pilling @ [phone] 12/31/2019 Kizzie Furnish. Performed at [facility], [address], [place], [place] [address]          Radiology Studies: DG Chest 1 View  Result Date: 12/30/2019 CLINICAL DATA:  History of IVDA with pain and swelling in the left upper extremity EXAM: CHEST  1 VIEW COMPARISON:  11/15/2018 FINDINGS: Cardiac shadow is stable. Coronary stenting is noted. Lungs are well aerated bilaterally. No focal infiltrate or sizable effusion is seen. No bony abnormality is noted. IMPRESSION: No acute abnormality seen. Electronically Signed   By: Alcide Clever M.D.   On: 12/30/2019 20:27   DG Forearm Left  Result Date: 12/30/2019 CLINICAL DATA:  Pain in LEFT forearm unable to rotate arm into full position. EXAM: LEFT FOREARM - 2 VIEW COMPARISON:  RIGHT wrist of 02/07/2019 FINDINGS: Extensive soft tissue swelling about the LEFT forearm. No gas in the soft tissues by plain film radiography. No sign of fracture or dislocation. IMPRESSION: Marked soft tissue swelling about the LEFT forearm. No acute osseous abnormality. Correlate with any signs of infection. Electronically Signed   By: Donzetta Kohut M.D.   On: 12/30/2019 14:07   ECHOCARDIOGRAM COMPLETE  Result Date: 12/31/2019    ECHOCARDIOGRAM REPORT   Patient Name:   AUNDRA ESPIN Chiang Date of Exam: 12/31/2019 Medical Rec #:  [id number]        Height:       72.0 in Accession #:    [id number]       Weight:       160.0 lb Date of Birth:  08-Sep-1985        BSA:          1.938 m Patient Age:    33 years         BP:           123/89 mmHg Patient Gender: M                HR:           88 bpm. Exam  Location:  Inpatient Procedure: 2D Echo, Color Doppler and Cardiac Doppler Indications:    I50.21 Acute systolic (congestive) heart failure  History:        Patient has prior history  of Echocardiogram examinations, most                 recent 11/15/2019. CHF, CAD; Risk Factors:Hypertension,                 Dyslipidemia and IVDU. Prior performed at [facility].  Sonographer:    Irving Burton Senior RDCS Referring Phys: [id number] Deno Lunger SHALHOUB IMPRESSIONS  1. Diffuse hypokinesis worse in septum Old echo from [place] not available but has had EF described in 25% range 2017 . Left ventricular ejection fraction, by estimation, is 25 to 30%. The left ventricle has severely decreased function. The left ventricle demonstrates global hypokinesis. The left ventricular internal cavity size was severely dilated. Left ventricular diastolic parameters were normal.  2. Right ventricular systolic function is normal. The right ventricular size is normal.  3. The mitral valve is normal in structure. Mild mitral valve regurgitation. No evidence of mitral stenosis.  4. The aortic valve is tricuspid. Aortic valve regurgitation is not visualized. No aortic stenosis is present.  5. The inferior vena cava is dilated in size with >50% respiratory variability, suggesting right atrial pressure of 8 mmHg. FINDINGS  Left Ventricle: Diffuse hypokinesis worse in septum Old echo from [place] not available but has had EF described in 25% range 2017. Left ventricular ejection fraction, by estimation, is 25 to 30%. The left ventricle has severely decreased function. The left ventricle demonstrates global hypokinesis. The left ventricular internal cavity size was severely dilated. There is no left ventricular hypertrophy. Left ventricular diastolic parameters were normal. Right Ventricle: The right ventricular size is normal. No increase in right ventricular wall thickness. Right ventricular systolic function is normal. Left Atrium: Left atrial size was normal in  size. Right Atrium: Right atrial size was normal in size. Pericardium: There is no evidence of pericardial effusion. Mitral Valve: The mitral valve is normal in structure. There is mild thickening of the mitral valve leaflet(s). Normal mobility of the mitral valve leaflets. Mild mitral valve regurgitation. No evidence of mitral valve stenosis. There is no evidence of mitral valve vegetation. Tricuspid Valve: The tricuspid valve is normal in structure. Tricuspid valve regurgitation is mild . No evidence of tricuspid stenosis. There is no evidence of tricuspid valve vegetation. Aortic Valve: The aortic valve is tricuspid. Aortic valve regurgitation is not visualized. No aortic stenosis is present. There is no evidence of aortic valve vegetation. Pulmonic Valve: The pulmonic valve was not well visualized. Pulmonic valve regurgitation is not visualized. No evidence of pulmonic stenosis. Aorta: The aortic root is normal in size and structure. Venous: The inferior vena cava is dilated in size with greater than 50% respiratory variability, suggesting right atrial pressure of 8 mmHg. IAS/Shunts: No atrial level shunt detected by color flow Doppler.  LEFT VENTRICLE PLAX 2D LVIDd:         6.30 cm      Diastology LVIDs:         5.90 cm      LV e' lateral: 7.62 cm/s LV PW:         0.90 cm      LV e' medial:  6.20 cm/s LV IVS:        0.80 cm LVOT diam:     2.10 cm LV SV:         41 LV SV Index:   21 LVOT Area:     3.46 cm  LV Volumes (MOD) LV vol d, MOD A2C: 228.0 ml LV vol d, MOD A4C: 277.0 ml LV  vol s, MOD A2C: 184.0 ml LV vol s, MOD A4C: 183.0 ml LV SV MOD A2C:     44.0 ml LV SV MOD A4C:     277.0 ml LV SV MOD BP:      65.6 ml RIGHT VENTRICLE RV S prime:     12.70 cm/s TAPSE (M-mode): 1.4 cm LEFT ATRIUM              Index       RIGHT ATRIUM           Index LA diam:        3.30 cm  1.70 cm/m  RA Area:     15.20 cm LA Vol (A2C):   102.0 ml 52.64 ml/m RA Volume:   43.20 ml  22.29 ml/m LA Vol (A4C):   57.4 ml  29.62 ml/m LA  Biplane Vol: 80.4 ml  41.49 ml/m  AORTIC VALVE LVOT Vmax:   70.50 cm/s LVOT Vmean:  55.400 cm/s LVOT VTI:    0.118 m  AORTA Ao Root diam: 3.10 cm  SHUNTS Systemic VTI:  0.12 m Systemic Diam: 2.10 cm Charlton Haws MD Electronically signed by Charlton Haws MD Signature Date/Time: 12/31/2019/11:40:51 AM    Final    VAS [place] UPPER EXTREMITY VENOUS DUPLEX  Result Date: 01/01/2020 UPPER VENOUS STUDY  Indications: increased swelling/ redness left arm, IV drug use Risk Factors: DVT Hx. Comparison Study: Previous study at outside facility 10/2019: left axillary and                   brachial DVT Performing Technologist: Jeb Levering RDMS, RVT  Examination Guidelines: A complete evaluation includes B-mode imaging, spectral Doppler, color Doppler, and power Doppler as needed of all accessible portions of each vessel. Bilateral testing is considered an integral part of a complete examination. Limited examinations for reoccurring indications may be performed as noted.  Left Findings: +----------+------------+---------+-----------+----------+-------+ LEFT      CompressiblePhasicitySpontaneousPropertiesSummary +----------+------------+---------+-----------+----------+-------+ IJV           Full       Yes       Yes                      +----------+------------+---------+-----------+----------+-------+ Subclavian    Full       Yes       Yes                      +----------+------------+---------+-----------+----------+-------+ Axillary      Full       Yes       Yes                      +----------+------------+---------+-----------+----------+-------+ Brachial      Full                                          +----------+------------+---------+-----------+----------+-------+ Radial        Full                                          +----------+------------+---------+-----------+----------+-------+ Ulnar         Full                                           +----------+------------+---------+-----------+----------+-------+  Cephalic      None                                   Acute  +----------+------------+---------+-----------+----------+-------+ Basilic       None                                   Acute  +----------+------------+---------+-----------+----------+-------+ Fluid noted around the cephalic vein in the forearm, which could indicate inflammation/ infection  Summary:  Left: No evidence of deep vein thrombosis in the upper extremity. Findings consistent with acute superficial vein thrombosis involving the left basilic vein and left cephalic vein.  *See table(s) above for measurements and observations.    Preliminary         Scheduled Meds: . apixaban  10 mg Oral BID   Followed by  . [START ON 01/06/2020] apixaban  5 mg Oral BID  . aspirin EC  81 mg Oral Daily  . atorvastatin  40 mg Oral q1800  . carvedilol  3.125 mg Oral BID WC  . Chlorhexidine Gluconate Cloth  6 each Topical Q0600  . feeding supplement (ENSURE ENLIVE)  237 mL Oral BID BM  . feeding supplement (PRO-STAT SUGAR FREE 64)  30 mL Oral BID  . lisinopril  10 mg Oral Daily  . multivitamin with minerals  1 tablet Oral Daily  . mupirocin ointment  1 application Nasal BID  . nicotine  21 mg Transdermal Daily  . oxyCODONE  15 mg Oral Q12H   Continuous Infusions: . cefTRIAXone (ROCEPHIN)  IV Stopped (12/31/19 1541)  . magnesium sulfate bolus IVPB 2 g (01/01/20 0823)  . vancomycin Stopped (01/01/20 0020)     LOS: 2 days     Georgette Shell, MD  01/01/2020, 8:40 AM

## 2020-01-01 NOTE — Progress Notes (Signed)
Pt with 7 beats VTach. Asymptomatic and resting in bed. MD made aware, will continue to monitor patient.

## 2020-01-02 LAB — PROCALCITONIN: Procalcitonin: <0.1 ng/mL

## 2020-01-02 NOTE — Progress Notes (Addendum)
Patient ID: Shaun [name], male   DOB: Aug 22, 1985, 34 y.o.   MRN: [id number] Request received for possible aspiration of left upper extremity fluid collection in pt; imaging studies were reviewed by Dr. Archer Asa; there is a less than 1 ml abscess noted in left distal forearm which is not amenable/too small for image guided aspiration or drainage. Please page Dr. Archer Asa at [phone] with any additional questions.

## 2020-01-02 NOTE — Progress Notes (Signed)
Subjective:  Patient states pain is severe in right forearm and hand. Denies feeling hot or sweaty this morning. No nausea or vomiting. No other complaints  Objective:  PE: VITALS:   Vitals:   01/01/20 0449 01/01/20 1417 01/01/20 2021 01/02/20 0348  BP: 130/77 126/80 125/78 126/90  Pulse: 99 95 (!) 102 (!) 103  Resp:  14 16 18   Temp: 98.5 F (36.9 C) 98.4 F (36.9 C) 98.5 F (36.9 C) 98.4 F (36.9 C)  TempSrc: Oral Oral Oral Oral  SpO2: 97% 98% 99% 100%  Weight:      Height:       General: Laying in bed, in no acute distress MSK: Significant edema to dorsal aspect of left hand, all fingers, and left distal forearm. Erythema of left forearm and hand with increased erythema on distal radial side of left forearm. Sensation intact in all fingers as well as dorsal and palmar aspects of hand. Able to flex and extend all fingers however ROM limited by pain, both flexion and extension increased from yesterday examination. No AROM at wrist due to pain. TTP to whole forearm however worse on radial aspect of distal forearm. This area is firm to palpation, no fluctuance that I can appreciate today.    LABS  Results for orders placed or performed during the hospital encounter of 12/30/19 (from the past 24 hour(s))  Procalcitonin     Status: None   Collection Time: 01/02/20  5:15 AM  Result Value Ref Range   Procalcitonin <0.10 ng/mL    CT FOREARM LEFT W CONTRAST  Result Date: 01/01/2020 CLINICAL DATA:  Left forearm and hand swelling. History of IV drug use. EXAM: CT OF THE UPPER LEFT EXTREMITY WITH CONTRAST TECHNIQUE: Multidetector CT imaging of the left forearm and hand was performed according to the standard protocol following intravenous contrast administration. CONTRAST:  152mL OMNIPAQUE IOHEXOL 300 MG/ML  SOLN COMPARISON:  Left forearm x-ray dated December 30, 2019. FINDINGS: Bones/Joint/Cartilage No bony destruction or periosteal reaction. No fracture or dislocation. Joint spaces  are preserved. No joint effusion. Bone mineralization is normal. Ligaments Ligaments are suboptimally evaluated by CT. Muscles and Tendons Grossly intact.  No definite muscle edema.  No muscle atrophy. Soft tissue Acute occlusive superficial thrombosis of the cephalic vein beginning in the mid forearm and extending to the wrist (series 10, image 48). Extension of acute occlusive thrombus within superficial wrist radial and dorsal branch veins arising from the cephalic vein (series 10, image 43). The basilic and deep veins are patent. There is a curvilinear 1.2 x 0.7 x 1.7 cm rim enhancing fluid collection adjacent to the occluded cephalic vein in the mid to distal forearm (series 9, image 129). No additional fluid collection. Diffuse soft tissue swelling of the forearm extending into the dorsal and radial aspect of the hand. No subcutaneous emphysema. IMPRESSION: 1. Acute occlusive superficial thrombosis of the cephalic vein beginning in the mid forearm and extending to the wrist. Extension of acute occlusive thrombus within superficial wrist radial and dorsal branch veins arising from the cephalic vein. Remaining veins are patent. 2. 1.7 cm curvilinear rim enhancing fluid collection adjacent to the occluded cephalic vein in the mid to distal forearm, concerning for small abscess. 3. Diffuse soft tissue swelling of the forearm extending into the dorsal and radial aspect of the hand. No evidence of deeper infection. 4. No acute osseous abnormality. Electronically Signed   By: Titus Dubin M.D.   On: 01/01/2020 13:27   VAS  [place] UPPER EXTREMITY VENOUS DUPLEX  Result Date: 01/01/2020 UPPER VENOUS STUDY  Indications: increased swelling/ redness left arm, IV drug use Risk Factors: DVT Hx. Comparison Study: Previous study at outside facility 10/2019: left axillary and                   brachial DVT Performing Technologist: Jeb Levering RDMS, RVT  Examination Guidelines: A complete evaluation includes B-mode imaging,  spectral Doppler, color Doppler, and power Doppler as needed of all accessible portions of each vessel. Bilateral testing is considered an integral part of a complete examination. Limited examinations for reoccurring indications may be performed as noted.  Left Findings: +----------+------------+---------+-----------+----------+-------+ LEFT      CompressiblePhasicitySpontaneousPropertiesSummary +----------+------------+---------+-----------+----------+-------+ IJV           Full       Yes       Yes                      +----------+------------+---------+-----------+----------+-------+ Subclavian    Full       Yes       Yes                      +----------+------------+---------+-----------+----------+-------+ Axillary      Full       Yes       Yes                      +----------+------------+---------+-----------+----------+-------+ Brachial      Full                                          +----------+------------+---------+-----------+----------+-------+ Radial        Full                                          +----------+------------+---------+-----------+----------+-------+ Ulnar         Full                                          +----------+------------+---------+-----------+----------+-------+ Cephalic      None                                   Acute  +----------+------------+---------+-----------+----------+-------+ Basilic       None                                   Acute  +----------+------------+---------+-----------+----------+-------+ Fluid noted around the cephalic vein in the forearm, which could indicate inflammation/ infection  Summary:  Left: No evidence of deep vein thrombosis in the upper extremity. Findings consistent with acute superficial vein thrombosis involving the left basilic vein and left cephalic vein.  *See table(s) above for measurements and observations.  Diagnosing physician: Sherald Hess MD Electronically  signed by Sherald Hess MD on 01/01/2020 at 11:14:33 AM.    Final     Assessment/Plan: Active Problems:   Coronary artery disease involving native coronary artery of native heart without angina pectoris   Essential hypertension   Mixed hyperlipidemia  Cellulitis of left upper extremity   Acute deep vein thrombosis (DVT) of left upper extremity (HCC)   Nicotine dependence, cigarettes, uncomplicated   Intravenous drug abuse (HCC)   Chronic systolic CHF (congestive heart failure) (HCC)   History of intravenous drug abuse (HCC)   Lung field abnormal finding on examination  Left upper extremity cellulitis and small fluid collection found on CT - plan for aspiration of fluid collection by IR later today -  If patient continues to not improve clinically after aspiration, will possibly wash out in OR with Dr. Eulah Pont on Tuesday - will continue to follow, watching for clinical improvement after aspiration  Contact information:   Weekdays 8-5 Janine Ores, PA-C [phone] A fter hours and holidays please check [email or web address] for group call information for [facility]  Armida Sans 01/02/2020, 9:39 AM

## 2020-01-02 NOTE — Anesthesia Preprocedure Evaluation (Addendum)
Anesthesia Evaluation  Patient identified by MRN, date of birth, ID band Patient awake    Reviewed: Allergy & Precautions, NPO status , Patient's Chart, lab work & pertinent test results  History of Anesthesia Complications Negative for: history of anesthetic complications  Airway Mallampati: II  TM Distance: >3 FB Neck ROM: Full    Dental no notable dental hx.    Pulmonary Current Smoker and Patient abstained from smoking.,    Pulmonary exam normal        Cardiovascular hypertension, Pt. on medications + CAD (s/p DES 2017), +CHF and + DVT (LUE)  Normal cardiovascular exam  TTE 12/2019: EF 25 to 30%, global hypokinesis, mild MR, mild TR    Neuro/Psych negative neurological ROS  negative psych ROS   GI/Hepatic negative GI ROS, (+)     substance abuse (last drug use approx 11 days ago)  cocaine use and IV drug use,   Endo/Other  negative endocrine ROS  Renal/GU negative Renal ROS  negative genitourinary   Musculoskeletal negative musculoskeletal ROS (+) narcotic dependent  Abdominal   Peds  Hematology negative hematology ROS (+)   Anesthesia Other Findings Day of surgery medications reviewed with patient.  Reproductive/Obstetrics negative OB ROS                            Anesthesia Physical Anesthesia Plan  ASA: III  Anesthesia Plan: Regional and MAC   Post-op Pain Management:    Induction:   PONV Risk Score and Plan: 0 and Treatment may vary due to age or medical condition, Propofol infusion, Ondansetron, Midazolam and Dexamethasone  Airway Management Planned: Natural Airway and Simple Face Mask  Additional Equipment: None  Intra-op Plan:   Post-operative Plan:   Informed Consent: I have reviewed the patients History and Physical, chart, labs and discussed the procedure including the risks, benefits and alternatives for the proposed anesthesia with the patient or authorized  representative who has indicated his/her understanding and acceptance.       Plan Discussed with: CRNA  Anesthesia Plan Comments:        Anesthesia Quick Evaluation

## 2020-01-02 NOTE — Progress Notes (Addendum)
PROGRESS NOTE    Shaun [name]  ZOX:[id number] DOB: 1986/06/01 DOA: 12/30/2019 PCP: Patient, No Pcp Per  Brief Narrative: HPI per Dr. Leafy Half 34 year old male with past medical history of intravenous drug abuse(Heroin, Fentanyl, Cocaine), ischemic cardiomyopathy status post MI with drug-eluting stent in 2017, systolic congestive heart failure(echo 2017 with EF 25%),hypertension, hyperlipidemia who presents to [facility] emergency department complaints of left upper extremity pain.  Of note, patient was hospitalized at [facility] in April 2021. During that hospitalization patient was found to have cellulitis of the chin and left upper extremity with concurrent left upper extremity DVT. Patient was also found to have group B strep sepsis. Patient was initially treated with anticoagulation and intravenous antibiotic therapy however patient ended up leaving AMA prior to completion of treatment. He left without receiving anyhome-going anticoagulation or antibiotics.  Patient was incarcerated 8 days prior to presentation.  Patient explains that approximately 4 days prior to presentation the pain in his left upper extremity began to worsen, starts in the forearm and radiates proximally. Pain is 10 out of 10 in intensity, burning to sharp in quality, worse with movement of the left upper extremity. He also c/o subjective fevers. Patient admits to continued injection of various drugs including fentanyl even in that arm with last injection 8 days prior to admit.  In ED clinically felt to be suffering from persisting left upper extremity cellulitis and suspected persisting left upper extremity DVT. Patient was provided with Eliquis 10 mg, ceftriaxone 2 g and 1 g of vancomycin. Patient was also provided with 1 L of normal saline. Blood cultures were obtained. The hospitalist group was then called to assess the patient for admission the hospital.  Assessment & Plan:     Active Problems:   Coronary artery disease involving native coronary artery of native heart without angina pectoris   Essential hypertension   Mixed hyperlipidemia   Cellulitis of left upper extremity   Acute deep vein thrombosis (DVT) of left upper extremity (HCC)   Nicotine dependence, cigarettes, uncomplicated   Intravenous drug abuse (HCC)   Chronic systolic CHF (congestive heart failure) (HCC)   History of intravenous drug abuse (HCC)   Lung field abnormal finding on examination    #1 left upper extremity cellulitis/SIRS- with DVT in the setting of incomplete treatment with IV antibiotics when patient left AGAINST MEDICAL ADVICE in April 2021 from [facility].  Patient started on Rocephin per cellulitis order set. Blood cultures no growth to date. MRSA PCR positive  Added vancomycin. Patient in 10 out of 10 excruciating pain in the left upper extremity. Change his pain management to long-acting OxyContin 15 twice daily with Dilaudid 1 mg every 3 as needed for breakthrough pain during his hospital stay. X-ray of the left upper extremity with no evidence of osteomyelitis. Patient presented with tachypnea tachycardia and febrile on admission with a temp of 101.9 with leukocytosis.  lactic acid 1.7, 1.6, 1.2   procalcitonin is 0.17. Check  Ct LUE-1.7 cm abscess.IR consulted.Ortho consulted 6/14: Patient was supposed to have drainage per IR.  However, IR unable to drain.  Informed orthopedics. -Plan for washout tomorrow per Ortho note. -Continue current antibiotics.  #2 left upper extremity DVT continue Eliquis.  Doppler showed no evidence of DVT but has superficial thrombophlebitis.  Continue Eliquis for a total of 3 months.  #3 ischemic cardiomyopathy with EF of 25% on aspirin, Coreg, and lisinopril, atorvastatin.  Patient does not appear volume overloaded at this time. ECHO  12/31/19-Diffuse hypokinesis worse in septum Old echo from [place] not available  but has had EF described in  25% range 2017 . Left ventricular ejection  fraction, by estimation, is 25 to 30%. The left ventricle has severely decreased function. The left ventricle  demonstrates global hypokinesis. The left ventricular internal cavity size  was severely dilated. Left ventricular diastolic parameters were normal.  Right ventricular systolic function is normal. The right ventricular size is normal.  The mitral valve is normal in structure. Mild mitral valve  regurgitation. No evidence of mitral stenosis.  The aortic valve is tricuspid. Aortic valve regurgitation is not  visualized. No aortic stenosis is present.  The inferior vena cava is dilated in size with >50% respiratory  variability, suggesting right atrial pressure of 8 mmHg.   #4 history of essential hypertension -BP 130/77.  Continue Coreg and lisinopril.    #5 history of mixed hyperlipidemia on atorvastatin  #6 history of IV drug use with history of cocaine heroin and fentanyl use.  Last use was 8 days prior to presentation.  He is currently incarcerated.  #7 history of tobacco use start nicotine patch  #8 NSVT-potassium 4.3.  Magnesium 2.1.  Continue to monitor.   Nutrition Problem: Increased nutrient needs Etiology: acute illness, chronic illness (persisting LUE cellulitis and DVT; sCHF)     Signs/Symptoms: estimated needs    Interventions: Ensure Enlive (each supplement provides 350kcal and 20 grams of protein), MVI, Prostat  Estimated body mass index is 21.7 kg/m as calculated from the following:   Height as of this encounter: 6' (1.829 m).   Weight as of this encounter: 72.6 kg.    DVT prophylaxis: Eliquis may need to be stopped if surgery planned? code Status: Full code Family Communication: Discussed with patient disposition Plan:  Status is: Inpatient  Dispo: The patient is from: [place]  Anticipated d/c is to: [place]  Anticipated d/c date is: Unknown  Patient currently is  not medically stable to d/c.   Consultants: None   Procedures: None Anti-infectives (From admission, onward)   Start     Dose/Rate Route Frequency Ordered Stop   12/31/19 1600  cefTRIAXone (ROCEPHIN) 2 g in sodium chloride 0.9 % 100 mL IVPB     Discontinue     2 g 200 mL/hr over 30 Minutes Intravenous Every 24 hours 12/30/19 2020     12/31/19 1100  vancomycin (VANCOREADY) IVPB 1500 mg/300 mL     Discontinue     1,500 mg 150 mL/hr over 120 Minutes Intravenous Every 12 hours 12/31/19 1010     12/30/19 1615  vancomycin (VANCOCIN) IVPB 1000 mg/200 mL premix        1,000 mg 200 mL/hr over 60 Minutes Intravenous  Once 12/30/19 1604 12/30/19 1856   12/30/19 1615  cefTRIAXone (ROCEPHIN) 2 g in sodium chloride 0.9 % 100 mL IVPB        2 g 200 mL/hr over 30 Minutes Intravenous  Once 12/30/19 1604 12/30/19 1735       Subjective: Patient continued to complain of worsening left upper extremity swelling, pain.  IR unable to drain. Officer present at bedside.  Denies having any chest pain at this time.  Objective: Vitals:   01/01/20 0449 01/01/20 1417 01/01/20 2021 01/02/20 0348  BP: 130/77 126/80 125/78 126/90  Pulse: 99 95 (!) 102 (!) 103  Resp:  14 16 18   Temp: 98.5 F (36.9 C) 98.4 F (36.9 C) 98.5 F (36.9 C) 98.4 F (36.9 C)  TempSrc: Oral  Oral Oral Oral  SpO2: 97% 98% 99% 100%  Weight:      Height:        Intake/Output Summary (Last 24 hours) at 01/02/2020 1126 Last data filed at 01/02/2020 0200 Gross per 24 hour  Intake 1180 ml  Output --  Net 1180 ml   Filed Weights   12/30/19 1950  Weight: 72.6 kg    Examination: General exam: Awake and oriented Respiratory system: Clear to auscultation. Respiratory effort normal. Cardiovascular system: S1 & S2 heard, RRR. No murmur. No pedal edema. Gastrointestinal system: Abdomen is nondistended, soft and nontender. No organomegaly or masses felt. Normal bowel sounds heard. Central nervous system: Alert and oriented. No focal  neurological deficits. Extremities: Left upper extremity swelling, tenderness, erythema extending above the left elbow Skin: No other rashes, lesions or ulcers Psychiatry: Judgement and insight appear normal. Mood & affect appropriate.     Data Reviewed: I have personally reviewed following labs and imaging studies  CBC: Recent Labs  Lab 12/30/19 1208 12/31/19 0359 01/01/20 0830  WBC 11.1* 12.1* 8.2  NEUTROABS 8.7* 8.9*  --   HGB 13.9 12.7* 12.3*  HCT 42.6 38.3* 38.4*  MCV 84.5 84.4 85.7  PLT 363 337 353   Basic Metabolic Panel: Recent Labs  Lab 12/30/19 1208 12/31/19 0359 01/01/20 0830  NA 132* 133* 133*  K 4.0 4.3 4.3  CL 95* 101 101  CO2 26 23 21*  GLUCOSE 119* 110* 112*  BUN 15 15 18   CREATININE 1.03 0.82 0.86  CALCIUM 8.9 8.7* 8.3*  MG  --  1.8 2.1   GFR: Estimated Creatinine Clearance: 124.3 mL/min (by C-G formula based on SCr of 0.86 mg/dL). Liver Function Tests: Recent Labs  Lab 12/30/19 1208 12/31/19 0359 01/01/20 0830  AST 16 14* 16  ALT 19 16 13   ALKPHOS 84 64 64  BILITOT 0.8 0.6 0.4  PROT 8.8* 7.1 7.3  ALBUMIN 3.7 2.9* 2.8*   No results for input(s): LIPASE, AMYLASE in the last 168 hours. No results for input(s): AMMONIA in the last 168 hours. Coagulation Profile: No results for input(s): INR, PROTIME in the last 168 hours. Cardiac Enzymes: No results for input(s): CKTOTAL, CKMB, CKMBINDEX, TROPONINI in the last 168 hours. BNP (last 3 results) No results for input(s): PROBNP in the last 8760 hours. HbA1C: No results for input(s): HGBA1C in the last 72 hours. CBG: No results for input(s): GLUCAP in the last 168 hours. Lipid Profile: No results for input(s): CHOL, HDL, LDLCALC, TRIG, CHOLHDL, LDLDIRECT in the last 72 hours. Thyroid Function Tests: No results for input(s): TSH, T4TOTAL, FREET4, T3FREE, THYROIDAB in the last 72 hours. Anemia Panel: No results for input(s): VITAMINB12, FOLATE, FERRITIN, TIBC, IRON, RETICCTPCT in the last 72  hours. Sepsis Labs: Recent Labs  Lab 12/30/19 1208 12/31/19 1032 12/31/19 1314 01/01/20 0531 01/02/20 0515  PROCALCITON  --  0.26  --  0.17 <0.10  LATICACIDVEN 1.7 1.6 1.2  --   --     Recent Results (from the past 240 hour(s))  Blood culture (routine x 2)     Status: None (Preliminary result)   Collection Time: 12/30/19  4:04 PM   Specimen: BLOOD  Result Value Ref Range Status   Specimen Description   Final    BLOOD RIGHT ARM Performed at [facility], [address] [place], [place] [address]    Special Requests   Final    BOTTLES DRAWN AEROBIC AND ANAEROBIC Blood Culture adequate volume Performed at [facility], [address], [place], [place] [address]    Culture   Final    NO GROWTH 3 DAYS Performed at [facility], [address] [place], [place] [address]    Report Status PENDING  Incomplete  SARS Coronavirus 2 by RT PCR (hospital order, performed in [facility] hospital lab) Nasopharyngeal Nasopharyngeal Swab     Status: None   Collection Time: 12/30/19  7:47 PM   Specimen: Nasopharyngeal Swab  Result Value Ref Range Status   SARS Coronavirus 2 NEGATIVE NEGATIVE Final    Comment: (NOTE) SARS-CoV-2 target nucleic acids are NOT DETECTED.  The SARS-CoV-2 RNA is generally detectable in upper and lower respiratory specimens during the acute phase of infection. The lowest concentration of SARS-CoV-2 viral copies this assay can detect is 250 copies / mL. A negative result does not preclude SARS-CoV-2 infection and should not be used as the sole basis for treatment or other patient management decisions.  A negative result may occur with improper specimen collection / handling, submission of specimen other than nasopharyngeal swab, presence of viral mutation(s) within the areas targeted by this assay, and inadequate number of viral copies (<250 copies / mL). A negative result must be combined with clinical observations,  patient history, and epidemiological information.  Fact Sheet for Patients:   BoilerBrush.com.cy  Fact Sheet for Healthcare Providers: https://pope.com/  This test is not yet approved or  cleared by the [place] FDA and has been authorized for detection and/or diagnosis of SARS-CoV-2 by FDA under an Emergency Use Authorization (EUA).  This EUA will remain in effect (meaning this test can be used) for the duration of the COVID-19 declaration under Section 564(b)(1) of the Act, 21 U.S.C. section 360bbb-3(b)(1), unless the authorization is terminated or revoked sooner.  Performed at [facility], [address] [place], [place] [address]   MRSA PCR Screening     Status: Abnormal   Collection Time: 12/30/19 11:57 PM   Specimen: Nasal Mucosa; Nasopharyngeal  Result Value Ref Range Status   MRSA by PCR POSITIVE (A) NEGATIVE Final    Comment:        The GeneXpert MRSA Assay (FDA approved for NASAL specimens only), is one component of a comprehensive MRSA colonization surveillance program. It is not intended to diagnose MRSA infection nor to guide or monitor treatment for MRSA infections. RESULT CALLED TO, READ BACK BY AND VERIFIED WITH: Deon Pilling @ [phone] 12/31/2019 Kizzie Furnish. Performed at [facility], [address], [place], [place] [address]          Radiology Studies: CT FOREARM LEFT W CONTRAST  Result Date: 01/01/2020 CLINICAL DATA:  Left forearm and hand swelling. History of IV drug use. EXAM: CT OF THE UPPER LEFT EXTREMITY WITH CONTRAST TECHNIQUE: Multidetector CT imaging of the left forearm and hand was performed according to the standard protocol following intravenous contrast administration. CONTRAST:  OMNIPAQUE IOHEXOL 300 MG/ML  SOLN COMPARISON:  Left forearm x-ray dated December 30, 2019. FINDINGS: Bones/Joint/Cartilage No bony destruction or periosteal reaction. No fracture  or dislocation. Joint spaces are preserved. No joint effusion. Bone mineralization is normal. Ligaments Ligaments are suboptimally evaluated by CT. Muscles and Tendons Grossly intact.  No definite muscle edema.  No muscle atrophy. Soft tissue Acute occlusive superficial thrombosis of the cephalic vein beginning in the mid forearm and extending to the wrist (series 10, image 48). Extension of acute occlusive thrombus within superficial wrist radial and dorsal branch  veins arising from the cephalic vein (series 10, image 43). The basilic and deep veins are patent. There is a curvilinear 1.2 x 0.7 x 1.7 cm rim enhancing fluid collection adjacent to the occluded cephalic vein in the mid to distal forearm (series 9, image 129). No additional fluid collection. Diffuse soft tissue swelling of the forearm extending into the dorsal and radial aspect of the hand. No subcutaneous emphysema. IMPRESSION: 1. Acute occlusive superficial thrombosis of the cephalic vein beginning in the mid forearm and extending to the wrist. Extension of acute occlusive thrombus within superficial wrist radial and dorsal branch veins arising from the cephalic vein. Remaining veins are patent. 2. 1.7 cm curvilinear rim enhancing fluid collection adjacent to the occluded cephalic vein in the mid to distal forearm, concerning for small abscess. 3. Diffuse soft tissue swelling of the forearm extending into the dorsal and radial aspect of the hand. No evidence of deeper infection. 4. No acute osseous abnormality. Electronically Signed   By: Titus Dubin M.D.   On: 01/01/2020 13:27   VAS [place] UPPER EXTREMITY VENOUS DUPLEX  Result Date: 01/01/2020 UPPER VENOUS STUDY  Indications: increased swelling/ redness left arm, IV drug use Risk Factors: DVT Hx. Comparison Study: Previous study at outside facility 10/2019: left axillary and                   brachial DVT Performing Technologist: June Leap RDMS, RVT  Examination Guidelines: A complete evaluation  includes B-mode imaging, spectral Doppler, color Doppler, and power Doppler as needed of all accessible portions of each vessel. Bilateral testing is considered an integral part of a complete examination. Limited examinations for reoccurring indications may be performed as noted.  Left Findings: +----------+------------+---------+-----------+----------+-------+ LEFT      CompressiblePhasicitySpontaneousPropertiesSummary +----------+------------+---------+-----------+----------+-------+ IJV           Full       Yes       Yes                      +----------+------------+---------+-----------+----------+-------+ Subclavian    Full       Yes       Yes                      +----------+------------+---------+-----------+----------+-------+ Axillary      Full       Yes       Yes                      +----------+------------+---------+-----------+----------+-------+ Brachial      Full                                          +----------+------------+---------+-----------+----------+-------+ Radial        Full                                          +----------+------------+---------+-----------+----------+-------+ Ulnar         Full                                          +----------+------------+---------+-----------+----------+-------+ Cephalic      None  Acute  +----------+------------+---------+-----------+----------+-------+ Basilic       None                                   Acute  +----------+------------+---------+-----------+----------+-------+ Fluid noted around the cephalic vein in the forearm, which could indicate inflammation/ infection  Summary:  Left: No evidence of deep vein thrombosis in the upper extremity. Findings consistent with acute superficial vein thrombosis involving the left basilic vein and left cephalic vein.  *See table(s) above for measurements and observations.  Diagnosing physician:  Sherald Hess MD Electronically signed by Sherald Hess MD on 01/01/2020 at 11:14:33 AM.    Final         Scheduled Meds: . apixaban  10 mg Oral BID   Followed by  . [START ON 01/06/2020] apixaban  5 mg Oral BID  . aspirin EC  81 mg Oral Daily  . atorvastatin  40 mg Oral q1800  . carvedilol  3.125 mg Oral BID WC  . Chlorhexidine Gluconate Cloth  6 each Topical Q0600  . feeding supplement (ENSURE ENLIVE)  237 mL Oral BID BM  . feeding supplement (PRO-STAT SUGAR FREE 64)  30 mL Oral BID  . lisinopril  10 mg Oral Daily  . multivitamin with minerals  1 tablet Oral Daily  . mupirocin ointment  1 application Nasal BID  . nicotine  21 mg Transdermal Daily  . oxyCODONE  15 mg Oral Q12H   Continuous Infusions: . cefTRIAXone (ROCEPHIN)  IV Stopped (01/01/20 1604)  . vancomycin Stopped (01/02/20 0105)     LOS: 3 days     Vonzella Nipple, MD Pager on amion  01/02/2020, 11:26 AM

## 2020-01-02 NOTE — H&P (View-Only) (Signed)
Subjective:  Patient states pain is severe in right forearm and hand. Denies feeling hot or sweaty this morning. No nausea or vomiting. No other complaints  Objective:  PE: VITALS:   Vitals:   01/01/20 0449 01/01/20 1417 01/01/20 2021 01/02/20 0348  BP: 130/77 126/80 125/78 126/90  Pulse: 99 95 (!) 102 (!) 103  Resp:  14 16 18   Temp: 98.5 F (36.9 C) 98.4 F (36.9 C) 98.5 F (36.9 C) 98.4 F (36.9 C)  TempSrc: Oral Oral Oral Oral  SpO2: 97% 98% 99% 100%  Weight:      Height:       General: Laying in bed, in no acute distress MSK: Significant edema to dorsal aspect of left hand, all fingers, and left distal forearm. Erythema of left forearm and hand with increased erythema on distal radial side of left forearm. Sensation intact in all fingers as well as dorsal and palmar aspects of hand. Able to flex and extend all fingers however ROM limited by pain, both flexion and extension increased from yesterday examination. No AROM at wrist due to pain. TTP to whole forearm however worse on radial aspect of distal forearm. This area is firm to palpation, no fluctuance that I can appreciate today.    LABS  Results for orders placed or performed during the hospital encounter of 12/30/19 (from the past 24 hour(s))  Procalcitonin     Status: None   Collection Time: 01/02/20  5:15 AM  Result Value Ref Range   Procalcitonin <0.10 ng/mL    CT FOREARM LEFT W CONTRAST  Result Date: 01/01/2020 CLINICAL DATA:  Left forearm and hand swelling. History of IV drug use. EXAM: CT OF THE UPPER LEFT EXTREMITY WITH CONTRAST TECHNIQUE: Multidetector CT imaging of the left forearm and hand was performed according to the standard protocol following intravenous contrast administration. CONTRAST:  152mL OMNIPAQUE IOHEXOL 300 MG/ML  SOLN COMPARISON:  Left forearm x-ray dated December 30, 2019. FINDINGS: Bones/Joint/Cartilage No bony destruction or periosteal reaction. No fracture or dislocation. Joint spaces  are preserved. No joint effusion. Bone mineralization is normal. Ligaments Ligaments are suboptimally evaluated by CT. Muscles and Tendons Grossly intact.  No definite muscle edema.  No muscle atrophy. Soft tissue Acute occlusive superficial thrombosis of the cephalic vein beginning in the mid forearm and extending to the wrist (series 10, image 48). Extension of acute occlusive thrombus within superficial wrist radial and dorsal branch veins arising from the cephalic vein (series 10, image 43). The basilic and deep veins are patent. There is a curvilinear 1.2 x 0.7 x 1.7 cm rim enhancing fluid collection adjacent to the occluded cephalic vein in the mid to distal forearm (series 9, image 129). No additional fluid collection. Diffuse soft tissue swelling of the forearm extending into the dorsal and radial aspect of the hand. No subcutaneous emphysema. IMPRESSION: 1. Acute occlusive superficial thrombosis of the cephalic vein beginning in the mid forearm and extending to the wrist. Extension of acute occlusive thrombus within superficial wrist radial and dorsal branch veins arising from the cephalic vein. Remaining veins are patent. 2. 1.7 cm curvilinear rim enhancing fluid collection adjacent to the occluded cephalic vein in the mid to distal forearm, concerning for small abscess. 3. Diffuse soft tissue swelling of the forearm extending into the dorsal and radial aspect of the hand. No evidence of deeper infection. 4. No acute osseous abnormality. Electronically Signed   By: Titus Dubin M.D.   On: 01/01/2020 13:27   VAS  US UPPER EXTREMITY VENOUS DUPLEX  Result Date: 01/01/2020 UPPER VENOUS STUDY  Indications: increased swelling/ redness left arm, IV drug use Risk Factors: DVT Hx. Comparison Study: Previous study at outside facility 10/2019: left axillary and                   brachial DVT Performing Technologist: Jill Parker RDMS, RVT  Examination Guidelines: A complete evaluation includes B-mode imaging,  spectral Doppler, color Doppler, and power Doppler as needed of all accessible portions of each vessel. Bilateral testing is considered an integral part of a complete examination. Limited examinations for reoccurring indications may be performed as noted.  Left Findings: +----------+------------+---------+-----------+----------+-------+ LEFT      CompressiblePhasicitySpontaneousPropertiesSummary +----------+------------+---------+-----------+----------+-------+ IJV           Full       Yes       Yes                      +----------+------------+---------+-----------+----------+-------+ Subclavian    Full       Yes       Yes                      +----------+------------+---------+-----------+----------+-------+ Axillary      Full       Yes       Yes                      +----------+------------+---------+-----------+----------+-------+ Brachial      Full                                          +----------+------------+---------+-----------+----------+-------+ Radial        Full                                          +----------+------------+---------+-----------+----------+-------+ Ulnar         Full                                          +----------+------------+---------+-----------+----------+-------+ Cephalic      None                                   Acute  +----------+------------+---------+-----------+----------+-------+ Basilic       None                                   Acute  +----------+------------+---------+-----------+----------+-------+ Fluid noted around the cephalic vein in the forearm, which could indicate inflammation/ infection  Summary:  Left: No evidence of deep vein thrombosis in the upper extremity. Findings consistent with acute superficial vein thrombosis involving the left basilic vein and left cephalic vein.  *See table(s) above for measurements and observations.  Diagnosing physician: Christopher Clark MD Electronically  signed by Christopher Clark MD on 01/01/2020 at 11:14:33 AM.    Final     Assessment/Plan: Active Problems:   Coronary artery disease involving native coronary artery of native heart without angina pectoris   Essential hypertension   Mixed hyperlipidemia     Cellulitis of left upper extremity   Acute deep vein thrombosis (DVT) of left upper extremity (HCC)   Nicotine dependence, cigarettes, uncomplicated   Intravenous drug abuse (HCC)   Chronic systolic CHF (congestive heart failure) (HCC)   History of intravenous drug abuse (HCC)   Lung field abnormal finding on examination  Left upper extremity cellulitis and small fluid collection found on CT - plan for aspiration of fluid collection by IR later today -  If patient continues to not improve clinically after aspiration, will possibly wash out in OR with Dr. Eulah Pont on Tuesday - will continue to follow, watching for clinical improvement after aspiration  Contact information:   Weekdays 8-5 Janine Ores, PA-C [phone] A fter hours and holidays please check [email or web address] for group call information for [facility]  Armida Sans 01/02/2020, 9:39 AM

## 2020-01-03 ENCOUNTER — Encounter (HOSPITAL_COMMUNITY)
Admission: EM | Disposition: A | Discharge status: Home / Self Care | Payer: Self-pay | Source: Home / Self Care | Attending: Internal Medicine

## 2020-01-03 ENCOUNTER — Encounter (HOSPITAL_COMMUNITY): Payer: Self-pay | Admitting: Internal Medicine

## 2020-01-03 ENCOUNTER — Inpatient Hospital Stay (HOSPITAL_COMMUNITY): Admitting: Anesthesiology

## 2020-01-03 HISTORY — PX: I & D EXTREMITY: SHX5045

## 2020-01-03 LAB — CBC
HCT: 35.2 % — ABNORMAL LOW (ref 39.0–52.0)
Hemoglobin: 11.2 g/dL — ABNORMAL LOW (ref 13.0–17.0)
MCH: 27.5 pg (ref 26.0–34.0)
MCHC: 31.8 g/dL (ref 30.0–36.0)
MCV: 86.3 fL (ref 80.0–100.0)
Platelets: 423 10*3/uL — ABNORMAL HIGH (ref 150–400)
RBC: 4.08 MIL/uL — ABNORMAL LOW (ref 4.22–5.81)
RDW: 13.2 % (ref 11.5–15.5)
WBC: 7.4 10*3/uL (ref 4.0–10.5)
nRBC: 0 % (ref 0.0–0.2)

## 2020-01-03 LAB — BASIC METABOLIC PANEL
Anion gap: 8 (ref 5–15)
BUN: 19 mg/dL (ref 6–20)
CO2: 26 mmol/L (ref 22–32)
Calcium: 8.5 mg/dL — ABNORMAL LOW (ref 8.9–10.3)
Chloride: 100 mmol/L (ref 98–111)
Creatinine, Ser: 0.85 mg/dL (ref 0.61–1.24)
GFR calc Af Amer: >60 mL/min (ref >60)
GFR calc non Af Amer: >60 mL/min (ref >60)
Glucose, Bld: 106 mg/dL — ABNORMAL HIGH (ref 70–99)
Potassium: 4.7 mmol/L (ref 3.5–5.1)
Sodium: 134 mmol/L — ABNORMAL LOW (ref 135–145)

## 2020-01-03 SURGERY — IRRIGATION AND DEBRIDEMENT EXTREMITY
Anesthesia: Monitor Anesthesia Care | Site: Arm Lower | Laterality: Left

## 2020-01-03 MED ORDER — LIDOCAINE 2% (20 MG/ML) 5 ML SYRINGE
INTRAMUSCULAR | Status: DC | PRN
  Administered 2020-01-03: 40 mg via INTRAVENOUS
  Administered 2020-01-03: 80 mg via INTRAVENOUS

## 2020-01-03 MED ORDER — MIDAZOLAM HCL 2 MG/2ML IJ SOLN
3.0000 mg | Freq: Once | INTRAMUSCULAR | Status: AC
  Administered 2020-01-03: 3 mg via INTRAVENOUS

## 2020-01-03 MED ORDER — CHLORHEXIDINE GLUCONATE 4 % EX LIQD
60.0000 mL | Freq: Once | CUTANEOUS | Status: DC
  Filled 2020-01-03: qty 60

## 2020-01-03 MED ORDER — FENTANYL CITRATE (PF) 100 MCG/2ML IJ SOLN
INTRAMUSCULAR | Status: AC
  Filled 2020-01-03: qty 2

## 2020-01-03 MED ORDER — ACETAMINOPHEN 500 MG PO TABS
1000.0000 mg | ORAL_TABLET | Freq: Once | ORAL | Status: AC
  Administered 2020-01-03: 1000 mg via ORAL

## 2020-01-03 MED ORDER — MIDAZOLAM HCL 5 MG/5ML IJ SOLN
INTRAMUSCULAR | Status: DC | PRN
  Administered 2020-01-03: 1 mg via INTRAVENOUS
  Administered 2020-01-03: 2 mg via INTRAVENOUS

## 2020-01-03 MED ORDER — LACTATED RINGERS IV SOLN: INTRAVENOUS | Status: DC

## 2020-01-03 MED ORDER — SODIUM CHLORIDE 0.9 % IV SOLN
INTRAVENOUS | Status: AC
  Filled 2020-01-03: qty 500000

## 2020-01-03 MED ORDER — PHENYLEPHRINE 40 MCG/ML (10ML) SYRINGE FOR IV PUSH (FOR BLOOD PRESSURE SUPPORT)
PREFILLED_SYRINGE | INTRAVENOUS | Status: DC | PRN
  Administered 2020-01-03: 40 ug via INTRAVENOUS

## 2020-01-03 MED ORDER — FENTANYL CITRATE (PF) 100 MCG/2ML IJ SOLN
25.0000 ug | Freq: Once | INTRAMUSCULAR | Status: AC
  Administered 2020-01-03: 25 ug via INTRAVENOUS
  Filled 2020-01-03: qty 2

## 2020-01-03 MED ORDER — MIDAZOLAM HCL 2 MG/2ML IJ SOLN
INTRAMUSCULAR | Status: AC
  Filled 2020-01-03: qty 2

## 2020-01-03 MED ORDER — 0.9 % SODIUM CHLORIDE (POUR BTL) OPTIME
TOPICAL | Status: DC | PRN
  Administered 2020-01-03: 1000 mL

## 2020-01-03 MED ORDER — ONDANSETRON HCL 4 MG/2ML IJ SOLN
INTRAMUSCULAR | Status: DC | PRN
  Administered 2020-01-03: 4 mg via INTRAVENOUS

## 2020-01-03 MED ORDER — CEFAZOLIN SODIUM-DEXTROSE 2-3 GM-%(50ML) IV SOLR
INTRAVENOUS | Status: DC | PRN
  Administered 2020-01-03: 2 g via INTRAVENOUS

## 2020-01-03 MED ORDER — KETAMINE HCL 10 MG/ML IJ SOLN
INTRAMUSCULAR | Status: AC
  Filled 2020-01-03: qty 1

## 2020-01-03 MED ORDER — BUPIVACAINE HCL (PF) 0.5 % IJ SOLN
INTRAMUSCULAR | Status: AC
  Filled 2020-01-03: qty 30

## 2020-01-03 MED ORDER — PROMETHAZINE HCL 25 MG/ML IJ SOLN: 6.2500 mg | INTRAMUSCULAR | Status: DC | PRN

## 2020-01-03 MED ORDER — CEFAZOLIN SODIUM-DEXTROSE 2-4 GM/100ML-% IV SOLN
INTRAVENOUS | Status: AC
  Filled 2020-01-03: qty 100

## 2020-01-03 MED ORDER — BUPIVACAINE-EPINEPHRINE (PF) 0.5% -1:200000 IJ SOLN
INTRAMUSCULAR | Status: AC
  Filled 2020-01-03: qty 30

## 2020-01-03 MED ORDER — FENTANYL CITRATE (PF) 100 MCG/2ML IJ SOLN: 25.0000 ug | INTRAMUSCULAR | Status: DC | PRN

## 2020-01-03 MED ORDER — BUPIVACAINE-EPINEPHRINE (PF) 0.5% -1:200000 IJ SOLN
INTRAMUSCULAR | Status: DC | PRN
  Administered 2020-01-03: 20 mL via PERINEURAL

## 2020-01-03 MED ORDER — BUPIVACAINE-EPINEPHRINE (PF) 0.5% -1:200000 IJ SOLN: INTRAMUSCULAR | Status: DC | PRN

## 2020-01-03 MED ORDER — PROPOFOL 500 MG/50ML IV EMUL
INTRAVENOUS | Status: AC
  Filled 2020-01-03: qty 50

## 2020-01-03 MED ORDER — BUPIVACAINE LIPOSOME 1.3 % IJ SUSP
INTRAMUSCULAR | Status: DC | PRN
  Administered 2020-01-03: 10 mL via PERINEURAL

## 2020-01-03 MED ORDER — ACETAMINOPHEN 500 MG PO TABS: 1000.0000 mg | ORAL_TABLET | Freq: Once | ORAL | Status: DC

## 2020-01-03 MED ORDER — FENTANYL CITRATE (PF) 100 MCG/2ML IJ SOLN
200.0000 ug | Freq: Once | INTRAMUSCULAR | Status: AC
  Administered 2020-01-03: 200 ug via INTRAVENOUS

## 2020-01-03 MED ORDER — CHLORHEXIDINE GLUCONATE 0.12 % MT SOLN
15.0000 mL | OROMUCOSAL | Status: AC
  Administered 2020-01-03: 15 mL via OROMUCOSAL

## 2020-01-03 MED ORDER — MELATONIN 3 MG PO TABS
6.0000 mg | ORAL_TABLET | Freq: Every day | ORAL | Status: DC
  Administered 2020-01-03 – 2020-01-07 (×3): 6 mg via ORAL
  Filled 2020-01-03 (×5): qty 2

## 2020-01-03 MED ORDER — PROPOFOL 1000 MG/100ML IV EMUL
INTRAVENOUS | Status: AC
  Filled 2020-01-03: qty 100

## 2020-01-03 MED ORDER — PROPOFOL 500 MG/50ML IV EMUL
INTRAVENOUS | Status: DC | PRN
  Administered 2020-01-03: 100 ug/kg/min via INTRAVENOUS

## 2020-01-03 MED ORDER — DEXAMETHASONE SODIUM PHOSPHATE 10 MG/ML IJ SOLN
INTRAMUSCULAR | Status: DC | PRN
  Administered 2020-01-03: 8 mg via INTRAVENOUS

## 2020-01-03 SURGICAL SUPPLY — 32 items
BNDG ELASTIC 4X5.8 VLCR STR LF (GAUZE/BANDAGES/DRESSINGS) ×2 IMPLANT
BNDG GAUZE ELAST 4 BULKY (GAUZE/BANDAGES/DRESSINGS) IMPLANT
COVER SURGICAL LIGHT HANDLE (MISCELLANEOUS) ×3 IMPLANT
COVER WAND RF STERILE (DRAPES) IMPLANT
CUFF TOURN SGL QUICK 18X4 (TOURNIQUET CUFF) ×3 IMPLANT
DRAIN PENROSE 0.25X18 (DRAIN) IMPLANT
DRSG EMULSION OIL 3X3 NADH (GAUZE/BANDAGES/DRESSINGS) ×2 IMPLANT
DRSG PAD ABDOMINAL 8X10 ST (GAUZE/BANDAGES/DRESSINGS) IMPLANT
ELECT REM PT RETURN 15FT ADLT (MISCELLANEOUS) ×3 IMPLANT
GAUZE SPONGE 4X4 12PLY STRL (GAUZE/BANDAGES/DRESSINGS) ×2 IMPLANT
GLOVE BIO SURGEON STRL SZ8 (GLOVE) ×3 IMPLANT
GOWN STRL REUS W/TWL XL LVL3 (GOWN DISPOSABLE) ×3 IMPLANT
KIT BASIN (CUSTOM PROCEDURE TRAY) ×3 IMPLANT
KIT TURNOVER KIT A (KITS) IMPLANT
LOOP VESSEL MAXI BLUE (MISCELLANEOUS) ×3 IMPLANT
MANIFOLD NEPTUNE II (INSTRUMENTS) ×3 IMPLANT
PACK ORTHO EXTREMITY (CUSTOM PROCEDURE TRAY) ×3 IMPLANT
PENCIL SMOKE EVACUATOR (MISCELLANEOUS) IMPLANT
PROTECTOR NERVE ULNAR (MISCELLANEOUS) ×3 IMPLANT
SOL PREP POV-IOD 4OZ 10% (MISCELLANEOUS) ×3 IMPLANT
SOL PREP PROV IODINE SCRUB 4OZ (MISCELLANEOUS) ×3 IMPLANT
SUT ETHILON 3 0 PS 1 (SUTURE) ×2 IMPLANT
SUT PROLENE 3 0 PS 2 (SUTURE) IMPLANT
SUT VIC AB 1 CT1 27 (SUTURE)
SUT VIC AB 1 CT1 27XBRD ANTBC (SUTURE) IMPLANT
SUT VIC AB 2-0 CT1 27 (SUTURE)
SUT VIC AB 2-0 CT1 27XBRD (SUTURE) IMPLANT
SWAB COLLECTION DEVICE MRSA (MISCELLANEOUS) IMPLANT
SWAB CULTURE ESWAB REG 1ML (MISCELLANEOUS) IMPLANT
SYR 20ML LL LF (SYRINGE) ×3 IMPLANT
SYR CONTROL 10ML LL (SYRINGE) IMPLANT
TOWEL OR 17X26 10 PK STRL BLUE (TOWEL DISPOSABLE) ×3 IMPLANT

## 2020-01-03 NOTE — Anesthesia Procedure Notes (Signed)
Anesthesia Regional Block: Supraclavicular block   Pre-Anesthetic Checklist: ,, timeout performed, Correct Patient, Correct Site, Correct Laterality, Correct Procedure, Correct Position, site marked, Risks and benefits discussed, pre-op evaluation,  At surgeon's request and post-op pain management  Laterality: Left  Prep: Maximum Sterile Barrier Precautions used, chloraprep       Needles:  Injection technique: Single-shot  Needle Type: Echogenic Stimulator Needle     Needle Length: 9cm  Needle Gauge: 22     Additional Needles:   Procedures:,,,, ultrasound used (permanent image in chart),,,,  Narrative:  Start time: 01/03/2020 8:27 AM End time: 01/03/2020 8:30 AM Injection made incrementally with aspirations every 5 mL.  Performed by: Personally  Anesthesiologist: Kaylyn Layer, MD  Additional Notes: Risks, benefits, and alternative discussed. Patient gave consent for procedure. Patient prepped and draped in sterile fashion. Sedation administered, patient remains easily responsive to voice. Relevant anatomy identified with ultrasound guidance. Local anesthetic given in 5cc increments with no signs or symptoms of intravascular injection. No pain or paraesthesias with injection. Patient monitored throughout procedure with signs of LAST or immediate complications. Tolerated well. Ultrasound image placed in chart.  Shaun Greenhouse, MD

## 2020-01-03 NOTE — Interval H&P Note (Signed)
I participated in the care of this patient and agree with the above history, physical and evaluation. I performed a review of the history and a physical exam as detailed   Myrissa Chipley Daniel Ebin Palazzi MD  

## 2020-01-03 NOTE — Progress Notes (Signed)
PROGRESS NOTE    Shaun [name]  WUX:[id number] DOB: Jan 27, 1986 DOA: 12/30/2019 PCP: Patient, No Pcp Per  Brief Narrative: HPI per Dr. Cyd Silence 34 year old male with past medical history of intravenous drug abuse(Heroin, Fentanyl, Cocaine), ischemic cardiomyopathy status post MI with drug-eluting stent in 2536, systolic congestive heart failure(echo 2017 with EF 25%),hypertension, hyperlipidemia who presents to [facility] emergency department complaints of left upper extremity pain.  Of note, patient was hospitalized at [facility] in April 2021. During that hospitalization patient was found to have cellulitis of the chin and left upper extremity with concurrent left upper extremity DVT. Patient was also found to have group B strep sepsis. Patient was initially treated with anticoagulation and intravenous antibiotic therapy however patient ended up leaving AMA prior to completion of treatment. He left without receiving anyhome-going anticoagulation or antibiotics.  Patient was incarcerated 8 days prior to presentation.  Patient explains that approximately 4 days prior to presentation the pain in his left upper extremity began to worsen, starts in the forearm and radiates proximally. Pain is 10 out of 10 in intensity, burning to sharp in quality, worse with movement of the left upper extremity. He also c/o subjective fevers. Patient admits to continued injection of various drugs including fentanyl even in that arm with last injection 8 days prior to admit.  In ED clinically felt to be suffering from persisting left upper extremity cellulitis and suspected persisting left upper extremity DVT. Patient was provided with Eliquis 10 mg, ceftriaxone 2 g and 1 g of vancomycin. Patient was also provided with 1 L of normal saline. Blood cultures were obtained. The hospitalist group was then called to assess the patient for admission the hospital.  Assessment & Plan:     Active Problems:   Coronary artery disease involving native coronary artery of native heart without angina pectoris   Essential hypertension   Mixed hyperlipidemia   Cellulitis of left upper extremity   Acute deep vein thrombosis (DVT) of left upper extremity (HCC)   Nicotine dependence, cigarettes, uncomplicated   Intravenous drug abuse (HCC)   Chronic systolic CHF (congestive heart failure) ([place])   History of intravenous drug abuse ([place])   Lung field abnormal finding on examination    #1 left upper extremity cellulitis/SIRS- with DVT in the setting of incomplete treatment with IV antibiotics when patient left AGAINST MEDICAL ADVICE in April 2021 from [facility].  Patient started on Rocephin per cellulitis order set. Blood cultures no growth to date. MRSA PCR positive  Added vancomycin. Patient in 10 out of 10 excruciating pain in the left upper extremity. Change his pain management to long-acting OxyContin 15 twice daily with Dilaudid 1 mg every 3 as needed for breakthrough pain during his hospital stay. X-ray of the left upper extremity with no evidence of osteomyelitis. Patient presented with tachypnea tachycardia and febrile on admission with a temp of 101.9 with leukocytosis.  lactic acid 1.7, 1.6, 1.2   procalcitonin is 0.17. CT LUE-1.7 cm abscess.IR consulted.Ortho consulted 6/14: Patient was supposed to have drainage per IR.  However, IR unable to drain.  Informed orthopedics. -He underwent surgery with washout today.  Not sure if cultures sent?  #2 left upper extremity DVT continue Eliquis.  Doppler showed no evidence of DVT but has superficial thrombophlebitis.  Eliquis held for surgery.  Resume when okay with orthopedics.  #3 ischemic cardiomyopathy with EF of 25% on aspirin, Coreg, and lisinopril, atorvastatin.  Patient does not appear volume overloaded at this  time. ECHO 12/31/19-Diffuse hypokinesis worse in septum. Old echo from [place] not available  but has had EF  described in 25% range 2017 . Left ventricular ejection  fraction, by estimation, is 25 to 30%. The left ventricle has severely decreased function. The left ventricle  demonstrates global hypokinesis. The left ventricular internal cavity size  was severely dilated. Left ventricular diastolic parameters were normal.  Right ventricular systolic function is normal. The right ventricular size is normal.  The mitral valve is normal in structure. Mild mitral valve  regurgitation. No evidence of mitral stenosis.  The aortic valve is tricuspid. Aortic valve regurgitation is not  visualized. No aortic stenosis is present.    #4 history of essential hypertension -Continue Coreg and lisinopril.  Continue to monitor blood pressure closely and adjust medications as needed.  #5 history of mixed hyperlipidemia on atorvastatin  #6 history of IV drug use with history of cocaine heroin and fentanyl use.  Last use was 8 days prior to presentation.  He is currently incarcerated.  #7 history of tobacco use start nicotine patch  #8 NSVT-potassium 4.7.  Magnesium 2.1.  Continue to monitor.   Nutrition Problem: Increased nutrient needs Etiology: acute illness, chronic illness (persisting LUE cellulitis and DVT; sCHF)     Signs/Symptoms: estimated needs    Interventions: Ensure Enlive (each supplement provides 350kcal and 20 grams of protein), MVI, Prostat  Estimated body mass index is 21.7 kg/m as calculated from the following:   Height as of this encounter: 6' (1.829 m).   Weight as of this encounter: 72.6 kg.    DVT prophylaxis: SCD. Eliquis held for surgery. code Status: Full code Family Communication: Discussed with patient disposition Plan:  Status is: Inpatient  Dispo: The patient is from: [place]  Anticipated d/c is to: [place]  Anticipated d/c date is: Unknown  Patient currently is not medically stable to d/c.   Consultants:   Orthopedics   Procedures:  Irrigation and debridement of left upper extremity (01/03/2020)  Anti-infectives (From admission, onward)   Start     Dose/Rate Route Frequency Ordered Stop   01/03/20 1017  ceFAZolin (ANCEF) 2-4 GM/100ML-% IVPB       Note to Pharmacy: Steffanie Dunn   : cabinet override      01/03/20 1017 01/03/20 2229   12/31/19 1600  cefTRIAXone (ROCEPHIN) 2 g in sodium chloride 0.9 % 100 mL IVPB     Discontinue     2 g 200 mL/hr over 30 Minutes Intravenous Every 24 hours 12/30/19 2020     12/31/19 1100  vancomycin (VANCOREADY) IVPB 1500 mg/300 mL     Discontinue     1,500 mg 150 mL/hr over 120 Minutes Intravenous Every 12 hours 12/31/19 1010     12/30/19 1615  vancomycin (VANCOCIN) IVPB 1000 mg/200 mL premix        1,000 mg 200 mL/hr over 60 Minutes Intravenous  Once 12/30/19 1604 12/30/19 1856   12/30/19 1615  cefTRIAXone (ROCEPHIN) 2 g in sodium chloride 0.9 % 100 mL IVPB        2 g 200 mL/hr over 30 Minutes Intravenous  Once 12/30/19 1604 12/30/19 1735       Subjective: Patient underwent irrigation and debridement of left upper extremity today.  Objective: Vitals:   01/03/20 1042 01/03/20 1045 01/03/20 1100 01/03/20 1138  BP: 98/70 100/68 105/70 108/65  Pulse: 86 86 81 81  Resp: 19 20 17 16   Temp: 97.7 F (36.5 C)  98 F (36.7 C) (!)  97.5 F (36.4 C)  TempSrc:    Oral  SpO2: 100% 100% 100% 98%  Weight:      Height:        Intake/Output Summary (Last 24 hours) at 01/03/2020 1559 Last data filed at 01/03/2020 1042 Gross per 24 hour  Intake 1340 ml  Output 5 ml  Net 1335 ml   Filed Weights   12/30/19 1950 01/03/20 0808  Weight: 72.6 kg 72.6 kg    Examination: General exam: Complaining of pain status post left upper extremity irrigation and debridement Respiratory system: Clear to auscultation. Respiratory effort normal. Cardiovascular system: S1 & S2 heard, RRR. No murmur. No pedal edema. Gastrointestinal system: Abdomen is nondistended,  soft and nontender. No organomegaly or masses felt. Normal bowel sounds heard. Central nervous system: Alert and oriented. No focal neurological deficits. Extremities: Left upper extremity with dressing in place status post surgery Skin: No other rashes, lesions or ulcers Psychiatry: Judgement and insight appear normal. Mood & affect appropriate.     Data Reviewed: I have personally reviewed following labs and imaging studies  CBC: Recent Labs  Lab 12/30/19 1208 12/31/19 0359 01/01/20 0830 01/03/20 0524  WBC 11.1* 12.1* 8.2 7.4  NEUTROABS 8.7* 8.9*  --   --   HGB 13.9 12.7* 12.3* 11.2*  HCT 42.6 38.3* 38.4* 35.2*  MCV 84.5 84.4 85.7 86.3  PLT 363 337 353 423*   Basic Metabolic Panel: Recent Labs  Lab 12/30/19 1208 12/31/19 0359 01/01/20 0830 01/03/20 0524  NA 132* 133* 133* 134*  K 4.0 4.3 4.3 4.7  CL 95* 101 101 100  CO2 26 23 21* 26  GLUCOSE 119* 110* 112* 106*  BUN 15 15 18 19   CREATININE 1.03 0.82 0.86 0.85  CALCIUM 8.9 8.7* 8.3* 8.5*  MG  --  1.8 2.1  --    GFR: Estimated Creatinine Clearance: 125.7 mL/min (by C-G formula based on SCr of 0.85 mg/dL). Liver Function Tests: Recent Labs  Lab 12/30/19 1208 12/31/19 0359 01/01/20 0830  AST 16 14* 16  ALT 19 16 13   ALKPHOS 84 64 64  BILITOT 0.8 0.6 0.4  PROT 8.8* 7.1 7.3  ALBUMIN 3.7 2.9* 2.8*   No results for input(s): LIPASE, AMYLASE in the last 168 hours. No results for input(s): AMMONIA in the last 168 hours. Coagulation Profile: No results for input(s): INR, PROTIME in the last 168 hours. Cardiac Enzymes: No results for input(s): CKTOTAL, CKMB, CKMBINDEX, TROPONINI in the last 168 hours. BNP (last 3 results) No results for input(s): PROBNP in the last 8760 hours. HbA1C: No results for input(s): HGBA1C in the last 72 hours. CBG: No results for input(s): GLUCAP in the last 168 hours. Lipid Profile: No results for input(s): CHOL, HDL, LDLCALC, TRIG, CHOLHDL, LDLDIRECT in the last 72  hours. Thyroid Function Tests: No results for input(s): TSH, T4TOTAL, FREET4, T3FREE, THYROIDAB in the last 72 hours. Anemia Panel: No results for input(s): VITAMINB12, FOLATE, FERRITIN, TIBC, IRON, RETICCTPCT in the last 72 hours. Sepsis Labs: Recent Labs  Lab 12/30/19 1208 12/31/19 1032 12/31/19 1314 01/01/20 0531 01/02/20 0515  PROCALCITON  --  0.26  --  0.17 <0.10  LATICACIDVEN 1.7 1.6 1.2  --   --     Recent Results (from the past 240 hour(s))  Blood culture (routine x 2)     Status: None (Preliminary result)   Collection Time: 12/30/19  4:04 PM   Specimen: BLOOD  Result Value Ref Range Status   Specimen Description   Final  BLOOD RIGHT ARM Performed at [facility], [address] [place], [place] [address]    Special Requests   Final    BOTTLES DRAWN AEROBIC AND ANAEROBIC Blood Culture adequate volume Performed at [facility], [address] [place], [place] [address]    Culture   Final    NO GROWTH 4 DAYS Performed at [facility], [address] [place], [place] [address]    Report Status PENDING  Incomplete  SARS Coronavirus 2 by RT PCR (hospital order, performed in [facility] hospital lab) Nasopharyngeal Nasopharyngeal Swab     Status: None   Collection Time: 12/30/19  7:47 PM   Specimen: Nasopharyngeal Swab  Result Value Ref Range Status   SARS Coronavirus 2 NEGATIVE NEGATIVE Final    Comment: (NOTE) SARS-CoV-2 target nucleic acids are NOT DETECTED.  The SARS-CoV-2 RNA is generally detectable in upper and lower respiratory specimens during the acute phase of infection. The lowest concentration of SARS-CoV-2 viral copies this assay can detect is 250 copies / mL. A negative result does not preclude SARS-CoV-2 infection and should not be used as the sole basis for treatment or other patient management decisions.  A negative result may occur with improper specimen collection / handling, submission of specimen  other than nasopharyngeal swab, presence of viral mutation(s) within the areas targeted by this assay, and inadequate number of viral copies (<250 copies / mL). A negative result must be combined with clinical observations, patient history, and epidemiological information.  Fact Sheet for Patients:   BoilerBrush.com.cyhttps://www.fda.gov/media/136312/download  Fact Sheet for Healthcare Providers: https://pope.com/https://www.fda.gov/media/136313/download  This test is not yet approved or  cleared by the [place] FDA and has been authorized for detection and/or diagnosis of SARS-CoV-2 by FDA under an Emergency Use Authorization (EUA).  This EUA will remain in effect (meaning this test can be used) for the duration of the COVID-19 declaration under Section 564(b)(1) of the Act, 21 U.S.C. section 360bbb-3(b)(1), unless the authorization is terminated or revoked sooner.  Performed at [facility], [address] [place], [place] [address]   MRSA PCR Screening     Status: Abnormal   Collection Time: 12/30/19 11:57 PM   Specimen: Nasal Mucosa; Nasopharyngeal  Result Value Ref Range Status   MRSA by PCR POSITIVE (A) NEGATIVE Final    Comment:        The GeneXpert MRSA Assay (FDA approved for NASAL specimens only), is one component of a comprehensive MRSA colonization surveillance program. It is not intended to diagnose MRSA infection nor to guide or monitor treatment for MRSA infections. RESULT CALLED TO, READ BACK BY AND VERIFIED WITH: Deon PillingVERNER, M. @ [phone] 12/31/2019 Kizzie FurnishPERRY, J. Performed at [facility], [address] [place], [place] [address]          Radiology Studies: No results found.   Scheduled Meds: . apixaban  10 mg Oral BID   Followed by  . [START ON 01/06/2020] apixaban  5 mg Oral BID  . aspirin EC  81 mg Oral Daily  . atorvastatin  40 mg Oral q1800  . carvedilol  3.125 mg Oral BID WC  . Chlorhexidine Gluconate Cloth  6 each Topical Q0600  .  feeding supplement (ENSURE ENLIVE)  237 mL Oral BID BM  . feeding supplement (PRO-STAT SUGAR FREE 64)  30 mL Oral BID  . fentaNYL      . fentaNYL      . lisinopril  10 mg Oral  Daily  . midazolam      . midazolam      . multivitamin with minerals  1 tablet Oral Daily  . mupirocin ointment  1 application Nasal BID  . nicotine  21 mg Transdermal Daily  . oxyCODONE  15 mg Oral Q12H   Continuous Infusions: . ceFAZolin    . cefTRIAXone (ROCEPHIN)  IV Stopped (01/02/20 1541)  . vancomycin Stopped (01/03/20 0107)     LOS: 4 days     Vonzella Nipple, MD Pager on amion  01/03/2020, 3:59 PM

## 2020-01-03 NOTE — Progress Notes (Signed)
AssistedDr. Howse with left, ultrasound guided, supraclavicular block. Side rails up, monitors on throughout procedure. See vital signs in flow sheet. Tolerated Procedure well. ° °

## 2020-01-03 NOTE — Anesthesia Procedure Notes (Signed)
Procedure Name: MAC Date/Time: 01/03/2020 10:08 AM Performed by: Deliah Boston, CRNA Pre-anesthesia Checklist: Patient identified, Emergency Drugs available, Suction available and Patient being monitored Patient Re-evaluated:Patient Re-evaluated prior to induction Oxygen Delivery Method: Simple face mask Preoxygenation: Pre-oxygenation with 100% oxygen Induction Type: IV induction Placement Confirmation: positive ETCO2 and breath sounds checked- equal and bilateral

## 2020-01-03 NOTE — Progress Notes (Signed)
Pharmacy Antibiotic Note  Shaun [name] is a 34 y.o. male admitted on 12/30/2019 with cellulitis.  Pharmacy has been consulted for vancomycin dosing. 01/03/2020  LUE cellulitis & small fluid collection on CT D#4 vanc/rocephin. WBC WNL, SCr 0.85 AF LUE fluid collection too small for IR drain> in OR for I&D currently.    Plan: Continue Vancomycin 1500mg  IV q12h (trough goal 10-15) Continue Ceftriaxone 2 gm IV q24 Follow renal function and clinical course Do not anticipate prolonged course of IV abx F/u OR cultures. F/u length of therapy If needed will check vancomycin trough   Height: 6' (182.9 cm) Weight: 72.6 kg (160 lb) IBW/kg (Calculated) : 77.6  Temp (24hrs), Avg:98.3 F (36.8 C), Min:98.1 F (36.7 C), Max:98.5 F (36.9 C)  Recent Labs  Lab 12/30/19 1208 12/31/19 0359 12/31/19 1032 12/31/19 1314 01/01/20 0830 01/03/20 0524  WBC 11.1* 12.1*  --   --  8.2 7.4  CREATININE 1.03 0.82  --   --  0.86 0.85  LATICACIDVEN 1.7  --  1.6 1.2  --   --     Estimated Creatinine Clearance: 125.7 mL/min (by C-G formula based on SCr of 0.85 mg/dL).    Allergies  Allergen Reactions  . Codeine Itching  . Penicillins     Itching Did it involve swelling of the face/tongue/throat, SOB, or low BP? SOB & Swelling Which Caused Blood Clots Did it involve sudden or severe rash/hives, skin peeling, or any reaction on the inside of your mouth or nose? No Did you need to seek medical attention at a hospital or doctor's office? Y When did it last happen?Over month ago If all above answers are "NO", may proceed with cephalosporin use.       Antimicrobials this admission:  6/11 CTX>> 6/11 vanc>> Dose adjustments this admission:   Microbiology results:  6/11 BCx x1: ngtd 6/11 MRSA PCR: positive  Thank you for allowing pharmacy to be a part of this patient's care.  8/11, Pharm.D 01/03/2020 8:51 AM

## 2020-01-03 NOTE — Progress Notes (Signed)
    Subjective: Patient underwent successful irrigation, debridement of his left forearm today by Dr. Eulah Pont.  Objective:   VITALS:   Vitals:   01/03/20 1042 01/03/20 1045 01/03/20 1100 01/03/20 1138  BP: 98/70 100/68 105/70 108/65  Pulse: 86 86 81 81  Resp: 19 20 17 16   Temp: 97.7 F (36.5 C)  98 F (36.7 C) (!) 97.5 F (36.4 C)  TempSrc:    Oral  SpO2: 100% 100% 100% 98%  Weight:      Height:       CBC Latest Ref Rng & Units 01/03/2020 01/01/2020 12/31/2019  WBC 4.0 - 10.5 K/uL 7.4 8.2 12.1(H)  Hemoglobin 13.0 - 17.0 g/dL 11.2(L) 12.3(L) 12.7(L)  Hematocrit 39 - 52 % 35.2(L) 38.4(L) 38.3(L)  Platelets 150 - 400 K/uL 423(H) 353 337   BMP Latest Ref Rng & Units 01/03/2020 01/01/2020 12/31/2019  Glucose 70 - 99 mg/dL 03/01/2020) 355(D) 322(G)  BUN 6 - 20 mg/dL 19 18 15   Creatinine 0.61 - 1.24 mg/dL 254(Y 7.06  Sodium 135 - 145 mmol/L 134(L) 133(L) 133(L)  Potassium 3.5 - 5.1 mmol/L 4.7 4.3 4.3  Chloride 98 - 111 mmol/L 100 101 101  CO2 22 - 32 mmol/L 26 21(L) 23  Calcium 8.9 - 10.3 mg/dL 2.37) 8.3(L) 8.7(L)   Intake/Output      06/14 0701 - 06/15 0700 06/15 0701 - 06/16 0700   P.O. 480    I.V. (mL/kg)  400 (5.5)   IV Piggyback 800    Total Intake(mL/kg) 1280 (17.6) 400 (5.5)   Urine (mL/kg/hr)  0 (0)   Blood  5   Total Output  5   Net +1280 +395        Urine Occurrence 1 x 1 x      Assessment / Plan: Day of Surgery  S/P Procedure(s) (LRB): IRRIGATION AND DEBRIDEMENT LEFT UPPER EXTREMITY (Left) by Dr. 7/15. 7/16 on 01/03/2020  Active Problems:   Coronary artery disease involving native coronary artery of native heart without angina pectoris   Essential hypertension   Mixed hyperlipidemia   Cellulitis of left upper extremity   Acute deep vein thrombosis (DVT) of left upper extremity (HCC)   Nicotine dependence, cigarettes, uncomplicated   Intravenous drug abuse (HCC)   Chronic systolic CHF (congestive heart failure) (HCC)   History of intravenous drug  abuse (HCC)   Lung field abnormal finding on examination   Left forearm infection Status post irrigation and debridement. Continue antibiotic therapy. WBAT Maintain dressings until follow-up Elevate arm above heart to reduce swelling and pain. Follow-up in the office with Dr. Eulah Pont in 10 days.   01/05/2020 III, PA-C 01/03/2020, 5:12 PM

## 2020-01-03 NOTE — Anesthesia Postprocedure Evaluation (Signed)
Anesthesia Post Note  Patient: Shaun [name]  Procedure(s) Performed: IRRIGATION AND DEBRIDEMENT LEFT UPPER EXTREMITY (Left Arm Lower)     Patient location during evaluation: PACU Anesthesia Type: Regional and MAC Level of consciousness: awake and alert and oriented Pain management: pain level controlled Vital Signs Assessment: post-procedure vital signs reviewed and stable Respiratory status: spontaneous breathing, nonlabored ventilation and respiratory function stable Cardiovascular status: blood pressure returned to baseline Postop Assessment: no apparent nausea or vomiting Anesthetic complications: no   No complications documented.  Last Vitals:  Vitals:   01/03/20 1100 01/03/20 1138  BP: 105/70 108/65  Pulse: 81 81  Resp: 17 16  Temp: 36.7 C (!) 36.4 C  SpO2: 100% 98%    Last Pain:  Vitals:   01/03/20 1138  TempSrc: Oral  PainSc: [place]

## 2020-01-03 NOTE — Op Note (Signed)
01/03/2020  12:58 PM  PATIENT:  Shaun [name]    PRE-OPERATIVE DIAGNOSIS:  infected left forearm  POST-OPERATIVE DIAGNOSIS:  Same  PROCEDURE:  IRRIGATION AND DEBRIDEMENT LEFT UPPER EXTREMITY  SURGEON:  Sheral Apley, MD  ASSISTANT: Aquilla Hacker, PA-C, he was present and scrubbed throughout the case, critical for completion in a timely fashion, and for retraction, instrumentation, and closure.   ANESTHESIA:   gen  PREOPERATIVE INDICATIONS:  Shaun [name] is a  34 y.o. male with a diagnosis of infected left forearm who failed conservative measures and elected for surgical management.    The risks benefits and alternatives were discussed with the patient preoperatively including but not limited to the risks of infection, bleeding, nerve injury, cardiopulmonary complications, the need for revision surgery, among others, and the patient was willing to proceed.  OPERATIVE IMPLANTS: none  OPERATIVE FINDINGS: radial forearm fluid collection  BLOOD LOSS: [name]  COMPLICATIONS: none  TOURNIQUET TIME: none  OPERATIVE PROCEDURE:  Patient was identified in the preoperative holding area and site was marked by me He was transported to the operating theater and placed on the table in supine position taking care to pad all bony prominences. After a preincinduction time out anesthesia was induced. The left upper extremity was prepped and draped in normal sterile fashion and a pre-incision timeout was performed. He received ancef for preoperative antibiotics.   Made a longitudinal incision over his radial forearm from the radial styloid proximally of roughly 5 cm in line with location of the possible abscess on CAT scan.  I bluntly dissected to the radial border of the radius I did not violate the periosteum identified his radial artery and protected that.  I performed a tenolysis of his FCU FCR and thumb flexor compartment.  I probed to confirm that all possible collections of fluid  have been expressed.  I irrigated with a liter saline  I performed a closure of this.  Sterile dressing was applied he was awoken taken to the PACU in stable condition  POST OPERATIVE PLAN: Mobilize for DVT prophylaxis IV antibiotics per primary team

## 2020-01-03 NOTE — Transfer of Care (Signed)
Immediate Anesthesia Transfer of Care Note  Patient: Damier Disano Capwell  Procedure(s) Performed: Procedure(s): IRRIGATION AND DEBRIDEMENT LEFT UPPER EXTREMITY (Left)  Patient Location: PACU  Anesthesia Type:MAC and Regional  Level of Consciousness: Patient easily awoken, sedated, comfortable, cooperative, following commands, responds to stimulation.   Airway & Oxygen Therapy: Patient spontaneously breathing, ventilating well, oxygen via simple oxygen mask.  Post-op Assessment: Report given to PACU RN, vital signs reviewed and stable, moving all extremities.   Post vital signs: Reviewed and stable.  Complications: No apparent anesthesia complications  Last Vitals:  Vitals Value Taken Time  BP 98/70 01/03/20 1041  Temp    Pulse 86 01/03/20 1045  Resp 20 01/03/20 1045  SpO2 100 % 01/03/20 1045  Vitals shown include unvalidated device data.  Last Pain:  Vitals:   01/03/20 0830  TempSrc:   PainSc: 6       Patients Stated Pain Goal: 2 (97/94/80 1655)  Complications: No complications documented.

## 2020-01-03 NOTE — Plan of Care (Signed)

## 2020-01-04 ENCOUNTER — Encounter (HOSPITAL_COMMUNITY): Payer: Self-pay | Admitting: Orthopedic Surgery

## 2020-01-04 LAB — CBC
HCT: 36.9 % — ABNORMAL LOW (ref 39.0–52.0)
Hemoglobin: 10.9 g/dL — ABNORMAL LOW (ref 13.0–17.0)
MCH: 26.9 pg (ref 26.0–34.0)
MCHC: 29.5 g/dL — ABNORMAL LOW (ref 30.0–36.0)
MCV: 91.1 fL (ref 80.0–100.0)
Platelets: 393 10*3/uL (ref 150–400)
RBC: 4.05 MIL/uL — ABNORMAL LOW (ref 4.22–5.81)
RDW: 12.9 % (ref 11.5–15.5)
WBC: 9 10*3/uL (ref 4.0–10.5)
nRBC: 0 % (ref 0.0–0.2)

## 2020-01-04 LAB — CULTURE, BLOOD (ROUTINE X 2)
Culture: NO GROWTH
Special Requests: ADEQUATE

## 2020-01-04 LAB — BASIC METABOLIC PANEL
Anion gap: 10 (ref 5–15)
BUN: 21 mg/dL — ABNORMAL HIGH (ref 6–20)
CO2: 22 mmol/L (ref 22–32)
Calcium: 8.5 mg/dL — ABNORMAL LOW (ref 8.9–10.3)
Chloride: 102 mmol/L (ref 98–111)
Creatinine, Ser: 0.62 mg/dL (ref 0.61–1.24)
GFR calc Af Amer: >60 mL/min (ref >60)
GFR calc non Af Amer: >60 mL/min (ref >60)
Glucose, Bld: 137 mg/dL — ABNORMAL HIGH (ref 70–99)
Potassium: 4.8 mmol/L (ref 3.5–5.1)
Sodium: 134 mmol/L — ABNORMAL LOW (ref 135–145)

## 2020-01-04 MED ORDER — BISACODYL 10 MG RE SUPP: 10.0000 mg | Freq: Every day | RECTAL | Status: DC | PRN

## 2020-01-04 MED ORDER — HYDROMORPHONE HCL 1 MG/ML IJ SOLN
0.5000 mg | INTRAMUSCULAR | Status: DC | PRN
  Administered 2020-01-04 – 2020-01-06 (×12): 1 mg via INTRAVENOUS
  Filled 2020-01-04 (×12): qty 1

## 2020-01-04 MED ORDER — CYCLOBENZAPRINE HCL 5 MG PO TABS
5.0000 mg | ORAL_TABLET | Freq: Three times a day (TID) | ORAL | Status: DC
  Administered 2020-01-04 – 2020-01-05 (×6): 5 mg via ORAL
  Filled 2020-01-04 (×6): qty 1

## 2020-01-04 MED ORDER — ZOLPIDEM TARTRATE 5 MG PO TABS
5.0000 mg | ORAL_TABLET | Freq: Every evening | ORAL | Status: DC | PRN
  Administered 2020-01-04 – 2020-01-07 (×4): 5 mg via ORAL
  Filled 2020-01-04 (×4): qty 1

## 2020-01-04 MED ORDER — GABAPENTIN 100 MG PO CAPS
100.0000 mg | ORAL_CAPSULE | Freq: Three times a day (TID) | ORAL | Status: DC
  Administered 2020-01-04 (×3): 100 mg via ORAL
  Filled 2020-01-04 (×2): qty 1

## 2020-01-04 MED ORDER — HYDROCODONE-ACETAMINOPHEN 5-325 MG PO TABS
1.0000 | ORAL_TABLET | ORAL | Status: DC | PRN
  Administered 2020-01-04 – 2020-01-05 (×5): 1 via ORAL
  Filled 2020-01-04 (×5): qty 1

## 2020-01-04 MED ORDER — POLYETHYLENE GLYCOL 3350 17 G PO PACK
17.0000 g | PACK | Freq: Every day | ORAL | Status: DC
  Administered 2020-01-05 – 2020-01-07 (×2): 17 g via ORAL
  Filled 2020-01-04 (×4): qty 1

## 2020-01-04 MED ORDER — LACTULOSE 10 GM/15ML PO SOLN
20.0000 g | Freq: Three times a day (TID) | ORAL | Status: DC
  Administered 2020-01-04: 20 g via ORAL
  Filled 2020-01-04: qty 30

## 2020-01-04 MED ORDER — LACTULOSE 10 GM/15ML PO SOLN
20.0000 g | Freq: Once | ORAL | Status: AC
  Administered 2020-01-04: 20 g via ORAL
  Filled 2020-01-04: qty 30

## 2020-01-04 MED ORDER — SENNOSIDES-DOCUSATE SODIUM 8.6-50 MG PO TABS
1.0000 | ORAL_TABLET | Freq: Two times a day (BID) | ORAL | Status: DC
  Administered 2020-01-04 – 2020-01-07 (×5): 1 via ORAL
  Filled 2020-01-04 (×8): qty 1

## 2020-01-04 NOTE — Plan of Care (Signed)

## 2020-01-04 NOTE — Progress Notes (Signed)
PROGRESS NOTE    Shaun [name]  FBP:[id number] DOB: [birth year] DOA: 12/30/2019 PCP: Patient, No Pcp Per  Brief Narrative: HPI per Dr. Leafy Half 34 year old male with past medical history of intravenous drug abuse(Heroin, Fentanyl, Cocaine), ischemic cardiomyopathy status post MI with drug-eluting stent in 2017, systolic congestive heart failure(echo 2017 with EF 25%),hypertension, hyperlipidemia who presents to [facility] emergency department complaints of left upper extremity pain.  Of note, patient was hospitalized at [facility] health in April 2021. During that hospitalization patient was found to have cellulitis of the chin and left upper extremity with concurrent left upper extremity DVT. Patient was also found to have group B strep sepsis. Patient was initially treated with anticoagulation and intravenous antibiotic therapy however patient ended up leaving AMA prior to completion of treatment. He left without receiving anyhome-going anticoagulation or antibiotics.  Patient was incarcerated 8 days prior to presentation.  Patient explains that approximately 4 days prior to presentation the pain in his left upper extremity began to worsen, starts in the forearm and radiates proximally. Pain is 10 out of 10 in intensity, burning to sharp in quality, worse with movement of the left upper extremity. He also c/o subjective fevers. Patient admits to continued injection of various drugs including fentanyl even in that arm with last injection 8 days prior to admit.  In ED clinically felt to be suffering from persisting left upper extremity cellulitis and suspected persisting left upper extremity DVT. Patient was provided with Eliquis 10 mg, ceftriaxone 2 g and 1 g of vancomycin. Patient was also provided with 1 L of normal saline. Blood cultures were obtained. The hospitalist group was then called to assess the patient for admission the hospital.  Assessment & Plan:     Active Problems:   Coronary artery disease involving native coronary artery of native heart without angina pectoris   Essential hypertension   Mixed hyperlipidemia   Cellulitis of left upper extremity   Acute deep vein thrombosis (DVT) of left upper extremity (HCC)   Nicotine dependence, cigarettes, uncomplicated   Intravenous drug abuse (HCC)   Chronic systolic CHF (congestive heart failure) (HCC)   History of intravenous drug abuse (HCC)   Lung field abnormal finding on examination    #1 left upper extremity cellulitis/SIRS- with DVT in the setting of incomplete treatment with IV antibiotics when patient left AGAINST MEDICAL ADVICE in April 2021 from [facility].  Patient started on Rocephin per cellulitis order set. Blood cultures no growth to date. MRSA PCR positive  Added vancomycin. Patient in 10 out of 10 excruciating pain in the left upper extremity. Change his pain management to long-acting OxyContin 15 twice daily with Dilaudid 1 mg every 3 as needed for breakthrough pain during his hospital stay. X-ray of the left upper extremity with no evidence of osteomyelitis. Tolerated incision and drainage of the upper extremity. Currently on IV antibiotics. We will modify pain medications that can be continued outside of the hospital.  #2 left upper extremity DVT continue Eliquis.  Doppler showed no evidence of DVT but has superficial thrombophlebitis.  Eliquis held for surgery.  Resume when okay with orthopedics.  #3 ischemic cardiomyopathy with EF of 25% on aspirin, Coreg, and lisinopril, atorvastatin.  Patient does not appear volume overloaded at this time. ECHO 12/31/19-Diffuse hypokinesis worse in septum. Old echo from [place] not available  but has had EF described in 25% range 2017 . Left ventricular ejection  fraction, by estimation, is 25 to 30%. The left ventricle  has severely decreased function. The left ventricle  demonstrates global hypokinesis. The left ventricular  internal cavity size  was severely dilated. Left ventricular diastolic parameters were normal.  Right ventricular systolic function is normal. The right ventricular size is normal.  The mitral valve is normal in structure. Mild mitral valve  regurgitation. No evidence of mitral stenosis.  The aortic valve is tricuspid. Aortic valve regurgitation is not  visualized. No aortic stenosis is present.    #4 history of essential hypertension -Continue Coreg and lisinopril.  Continue to monitor blood pressure closely and adjust medications as needed.  #5 history of mixed hyperlipidemia on atorvastatin  #6 history of IV drug use with history of cocaine heroin and fentanyl use.  Last use was 8 days prior to presentation.  He is currently incarcerated.  #7 history of tobacco use start nicotine patch  #8 NSVT-potassium 4.7.  Magnesium 2.1.  Continue to monitor.   Nutrition Problem: Increased nutrient needs Etiology: acute illness, chronic illness (persisting LUE cellulitis and DVT; sCHF)     Signs/Symptoms: estimated needs    Interventions: Ensure Enlive (each supplement provides 350kcal and 20 grams of protein), MVI, Prostat  Estimated body mass index is 21.7 kg/m as calculated from the following:   Height as of this encounter: 6' (1.829 m).   Weight as of this encounter: 72.6 kg.    DVT prophylaxis: SCD. Eliquis held for surgery. code Status: Full code Family Communication: Discussed with patient disposition Plan:  Status is: Inpatient  [place] [facility]  [place] [facility]  Anticipated d/c date is: Unknown  Patient currently is not medically stable to d/c.   Consultants:  Orthopedics   Procedures:  Irrigation and debridement of left upper extremity (01/03/2020)  Anti-infectives (From admission, onward)   Start     Dose/Rate Route Frequency Ordered Stop   01/03/20 1017  ceFAZolin (ANCEF) 2-4 GM/100ML-%  IVPB       Note to Pharmacy: Steffanie DunnEdathil, [place]   : cabinet override      01/03/20 1017 01/03/20 2229   12/31/19 1600  cefTRIAXone (ROCEPHIN) 2 g in sodium chloride 0.9 % 100 mL IVPB     Discontinue     2 g 200 mL/hr over 30 Minutes Intravenous Every 24 hours 12/30/19 2020     12/31/19 1100  vancomycin (VANCOREADY) IVPB 1500 mg/300 mL     Discontinue     1,500 mg 150 mL/hr over 120 Minutes Intravenous Every 12 hours 12/31/19 1010     12/30/19 1615  vancomycin (VANCOCIN) IVPB 1000 mg/200 mL premix        1,000 mg 200 mL/hr over 60 Minutes Intravenous  Once 12/30/19 1604 12/30/19 1856   12/30/19 1615  cefTRIAXone (ROCEPHIN) 2 g in sodium chloride 0.9 % 100 mL IVPB        2 g 200 mL/hr over 30 Minutes Intravenous  Once 12/30/19 1604 12/30/19 1735       Subjective: Reports pain uncontrolled.  No nausea no vomiting.  No fever no chills.  Also reports constipation.  Minimal oral intake.  Objective: Vitals:   01/03/20 2150 01/04/20 0629 01/04/20 1309 01/04/20 1633  BP: 123/72 111/64 120/69 118/70  Pulse: 100 83 97 96  Resp: 14 18 14    Temp: 98.1 F (36.7 C) 97.7 F (36.5 C) 98.7 F (37.1 C) 99.2 F (37.3 C)  TempSrc: Oral Oral  Oral  SpO2: 97% 100% 98%   Weight:      Height:  Intake/Output Summary (Last 24 hours) at 01/04/2020 2001 Last data filed at 01/04/2020 1700 Gross per 24 hour  Intake 1550 ml  Output --  Net 1550 ml   Filed Weights   12/30/19 1950 01/03/20 0808  Weight: 72.6 kg 72.6 kg    Examination: General exam: Complaining of pain status post left upper extremity irrigation and debridement Respiratory system: Clear to auscultation. Respiratory effort normal. Cardiovascular system: S1 & S2 heard, RRR. No murmur. No pedal edema. Gastrointestinal system: Abdomen is nondistended, soft and nontender. No organomegaly or masses felt. Normal bowel sounds heard. Central nervous system: Alert and oriented. No focal neurological deficits. Extremities: Left upper  extremity with dressing in place status post surgery Skin: No other rashes, lesions or ulcers Psychiatry: Judgement and insight appear normal. Mood & affect appropriate.     Data Reviewed: I have personally reviewed following labs and imaging studies  CBC: Recent Labs  Lab 12/30/19 1208 12/31/19 0359 01/01/20 0830 01/03/20 0524 01/04/20 0422  WBC 11.1* 12.1* 8.2 7.4 9.0  NEUTROABS 8.7* 8.9*  --   --   --   HGB 13.9 12.7* 12.3* 11.2* 10.9*  HCT 42.6 38.3* 38.4* 35.2* 36.9*  MCV 84.5 84.4 85.7 86.3 91.1  PLT 363 337 353 423* 393   Basic Metabolic Panel: Recent Labs  Lab 12/30/19 1208 12/31/19 0359 01/01/20 0830 01/03/20 0524 01/04/20 0422  NA 132* 133* 133* 134* 134*  K 4.0 4.3 4.3 4.7 4.8  CL 95* 101 101 100 102  CO2 26 23 21* 26 22  GLUCOSE 119* 110* 112* 106* 137*  BUN 15 15 18 19  21*  CREATININE 1.03 0.82 0.86 0.85 0.62  CALCIUM 8.9 8.7* 8.3* 8.5* 8.5*  MG  --  1.8 2.1  --   --    GFR: Estimated Creatinine Clearance: 133.6 mL/min (by C-G formula based on SCr of 0.62 mg/dL). Liver Function Tests: Recent Labs  Lab 12/30/19 1208 12/31/19 0359 01/01/20 0830  AST 16 14* 16  ALT 19 16 13   ALKPHOS 84 64 64  BILITOT 0.8 0.6 0.4  PROT 8.8* 7.1 7.3  ALBUMIN 3.7 2.9* 2.8*   No results for input(s): LIPASE, AMYLASE in the last 168 hours. No results for input(s): AMMONIA in the last 168 hours. Coagulation Profile: No results for input(s): INR, PROTIME in the last 168 hours. Cardiac Enzymes: No results for input(s): CKTOTAL, CKMB, CKMBINDEX, TROPONINI in the last 168 hours. BNP (last 3 results) No results for input(s): PROBNP in the last 8760 hours. HbA1C: No results for input(s): HGBA1C in the last 72 hours. CBG: No results for input(s): GLUCAP in the last 168 hours. Lipid Profile: No results for input(s): CHOL, HDL, LDLCALC, TRIG, CHOLHDL, LDLDIRECT in the last 72 hours. Thyroid Function Tests: No results for input(s): TSH, T4TOTAL, FREET4, T3FREE,  THYROIDAB in the last 72 hours. Anemia Panel: No results for input(s): VITAMINB12, FOLATE, FERRITIN, TIBC, IRON, RETICCTPCT in the last 72 hours. Sepsis Labs: Recent Labs  Lab 12/30/19 1208 12/31/19 1032 12/31/19 1314 01/01/20 0531 01/02/20 0515  PROCALCITON  --  0.26  --  0.17 <0.10  LATICACIDVEN 1.7 1.6 1.2  --   --     Recent Results (from the past 240 hour(s))  Blood culture (routine x 2)     Status: None   Collection Time: 12/30/19  4:04 PM   Specimen: BLOOD  Result Value Ref Range Status   Specimen Description   Final    BLOOD RIGHT ARM Performed at [facility]  Hospital, [address] [place], [place] [address]    Special Requests   Final    BOTTLES DRAWN AEROBIC AND ANAEROBIC Blood Culture adequate volume Performed at [facility], [address], [place], [place] [address]    Culture   Final    NO GROWTH 5 DAYS Performed at [facility], [address] [place], [place] [address]    Report Status 01/04/2020 FINAL  Final  SARS Coronavirus 2 by RT PCR (hospital order, performed in [facility] hospital lab) Nasopharyngeal Nasopharyngeal Swab     Status: None   Collection Time: 12/30/19  7:47 PM   Specimen: Nasopharyngeal Swab  Result Value Ref Range Status   SARS Coronavirus 2 NEGATIVE NEGATIVE Final    Comment: (NOTE) SARS-CoV-2 target nucleic acids are NOT DETECTED.  The SARS-CoV-2 RNA is generally detectable in upper and lower respiratory specimens during the acute phase of infection. The lowest concentration of SARS-CoV-2 viral copies this assay can detect is 250 copies / mL. A negative result does not preclude SARS-CoV-2 infection and should not be used as the sole basis for treatment or other patient management decisions.  A negative result may occur with improper specimen collection / handling, submission of specimen other than nasopharyngeal swab, presence of viral mutation(s) within the areas targeted by this assay,  and inadequate number of viral copies (<250 copies / mL). A negative result must be combined with clinical observations, patient history, and epidemiological information.  Fact Sheet for Patients:   BoilerBrush.com.cy  Fact Sheet for Healthcare Providers: https://pope.com/  This test is not yet approved or  cleared by the [place] FDA and has been authorized for detection and/or diagnosis of SARS-CoV-2 by FDA under an Emergency Use Authorization (EUA).  This EUA will remain in effect (meaning this test can be used) for the duration of the COVID-19 declaration under Section 564(b)(1) of the Act, 21 U.S.C. section 360bbb-3(b)(1), unless the authorization is terminated or revoked sooner.  Performed at [facility], [address] [place], [place] [address]   MRSA PCR Screening     Status: Abnormal   Collection Time: 12/30/19 11:57 PM   Specimen: Nasal Mucosa; Nasopharyngeal  Result Value Ref Range Status   MRSA by PCR POSITIVE (A) NEGATIVE Final    Comment:        The GeneXpert MRSA Assay (FDA approved for NASAL specimens only), is one component of a comprehensive MRSA colonization surveillance program. It is not intended to diagnose MRSA infection nor to guide or monitor treatment for MRSA infections. RESULT CALLED TO, READ BACK BY AND VERIFIED WITH: Deon Pilling @ [phone] 12/31/2019 Kizzie Furnish. Performed at [facility], [address] [place], [place] [address]          Radiology Studies: No results found.   Scheduled Meds: . apixaban  10 mg Oral BID   Followed by  . [START ON 01/06/2020] apixaban  5 mg Oral BID  . aspirin EC  81 mg Oral Daily  . atorvastatin  40 mg Oral q1800  . carvedilol  3.125 mg Oral BID WC  . cyclobenzaprine  5 mg Oral TID  . feeding supplement (ENSURE ENLIVE)  237 mL Oral BID BM  . feeding supplement (PRO-STAT SUGAR FREE 64)  30 mL Oral BID  .  gabapentin  100 mg Oral TID  . lactulose  20 g Oral TID  . lisinopril  10 mg Oral Daily  . melatonin  6 mg  Oral QHS  . multivitamin with minerals  1 tablet Oral Daily  . mupirocin ointment  1 application Nasal BID  . nicotine  21 mg Transdermal Daily  . oxyCODONE  15 mg Oral Q12H  . [START ON 01/05/2020] polyethylene glycol  17 g Oral Daily  . senna-docusate  1 tablet Oral BID   Continuous Infusions: . cefTRIAXone (ROCEPHIN)  IV 2 g (01/04/20 1638)  . vancomycin 1,500 mg (01/04/20 1037)     LOS: 5 days     Berle Mull , MD Pager on [facility]  01/04/2020, 8:01 PM

## 2020-01-04 NOTE — Plan of Care (Signed)
  Problem: Health Behavior/Discharge Planning: Goal: Ability to manage health-related needs will improve Outcome: Progressing   

## 2020-01-05 LAB — C-REACTIVE PROTEIN: CRP: 1.6 mg/dL — ABNORMAL HIGH (ref <1.0)

## 2020-01-05 LAB — SEDIMENTATION RATE: Sed Rate: 35 mm/hr — ABNORMAL HIGH (ref 0–16)

## 2020-01-05 MED ORDER — SULFAMETHOXAZOLE-TRIMETHOPRIM 800-160 MG PO TABS: 1.0000 | ORAL_TABLET | Freq: Two times a day (BID) | ORAL | Status: DC

## 2020-01-05 MED ORDER — ENSURE ENLIVE PO LIQD: 237.0000 mL | ORAL | Status: DC

## 2020-01-05 MED ORDER — SULFAMETHOXAZOLE-TRIMETHOPRIM 800-160 MG PO TABS
2.0000 | ORAL_TABLET | Freq: Two times a day (BID) | ORAL | Status: DC
  Administered 2020-01-05 – 2020-01-08 (×7): 2 via ORAL
  Filled 2020-01-05 (×6): qty 2

## 2020-01-05 MED ORDER — PRO-STAT SUGAR FREE PO LIQD
30.0000 mL | Freq: Every day | ORAL | Status: DC
  Filled 2020-01-05 (×2): qty 30

## 2020-01-05 MED ORDER — GABAPENTIN 300 MG PO CAPS
300.0000 mg | ORAL_CAPSULE | Freq: Three times a day (TID) | ORAL | Status: DC
  Administered 2020-01-05 – 2020-01-08 (×10): 300 mg via ORAL
  Filled 2020-01-05 (×10): qty 1

## 2020-01-05 MED ORDER — OXYCODONE HCL 5 MG PO TABS
5.0000 mg | ORAL_TABLET | ORAL | Status: DC | PRN
  Administered 2020-01-05 – 2020-01-06 (×7): 5 mg via ORAL
  Filled 2020-01-05 (×7): qty 1

## 2020-01-05 MED ORDER — OXYCODONE-ACETAMINOPHEN 5-325 MG PO TABS
1.0000 | ORAL_TABLET | ORAL | Status: DC | PRN
  Administered 2020-01-05 – 2020-01-06 (×7): 1 via ORAL
  Filled 2020-01-05 (×7): qty 1

## 2020-01-05 NOTE — Progress Notes (Signed)
Subjective: Orthopedics called back to evaluate the patient who complains of increasing hand numbness.  Patient complains of nonspecific diffuse numbness throughout his hand. He has not been elevating.  Objective:   VITALS:   Vitals:   01/04/20 2043 01/05/20 0618 01/05/20 0943 01/05/20 1316  BP: 110/61 115/78 117/75 120/81  Pulse: 90 90  93  Resp: 18 18  20   Temp: 98.1 F (36.7 C) 97.9 F (36.6 C)  98 F (36.7 C)  TempSrc: Oral Oral  Oral  SpO2: 98% 100%  98%  Weight:      Height:       CBC Latest Ref Rng & Units 01/04/2020 01/03/2020 01/01/2020  WBC 4.0 - 10.5 K/uL 9.0 7.4 8.2  Hemoglobin 13.0 - 17.0 g/dL 10.9(L) 11.2(L) 12.3(L)  Hematocrit 39 - 52 % 36.9(L) 35.2(L) 38.4(L)  Platelets 150 - 400 K/uL 393 423(H) 353   BMP Latest Ref Rng & Units 01/04/2020 01/03/2020 01/01/2020  Glucose 70 - 99 mg/dL 01/03/2020) 038(U) 828(M)  BUN 6 - 20 mg/dL 034(J) 19 18  Creatinine 0.61 - 1.24 mg/dL 17(H 1.50 5.69  Sodium 135 - 145 mmol/L 134(L) 134(L) 133(L)  Potassium 3.5 - 5.1 mmol/L 4.8 4.7 4.3  Chloride 98 - 111 mmol/L 102 100 101  CO2 22 - 32 mmol/L 22 26 21(L)  Calcium 8.9 - 10.3 mg/dL 7.94) 8.0(X) 8.3(L)   Intake/Output      06/16 0701 - 06/17 0700 06/17 0701 - 06/18 0700   P.O. 1300 720   I.V. (mL/kg)     Other  0   IV Piggyback 600 0   Total Intake(mL/kg) 1900 (26.2) 720 (9.9)   Urine (mL/kg/hr)     Blood     Total Output     Net +1900 +720        Urine Occurrence 1 x 1 x     Physical exam: General: Alert, uncomfortable/anxious appearing in no acute distress. Conversant.  Law enforcement at bedside.  MSK: LUE: Surgical dressings removed. Surgical site clean dry and intact with nylon suture. No drainage, erythema, or sign of infection. Minimal swelling in this area. Hand is warm with significant swelling in hand and fingers distal to surgical dressings.. There is no erythema. Motor function grossly intact although with significant guarding. Sensation also grossly intact  with global subjective decrease in sensation. Mild to moderate swelling in his forearm which is very soft and compressible. Elbow range of motion intact.  New absorbent padded, compressive dressings applied including the hand up to MCP. Sterile gauze placed between fingers and over Adaptic/surgical site. This was wrapped with Kerlix and Ace wrap.  Assessment / Plan: 2 Days Post-Op  S/P Procedure(s) (LRB): IRRIGATION AND DEBRIDEMENT LEFT UPPER EXTREMITY (Left) by Dr. 7/18. Jewel Baize on 01/03/2020  Active Problems:   Coronary artery disease involving native coronary artery of native heart without angina pectoris   Essential hypertension   Mixed hyperlipidemia   Cellulitis of left upper extremity   Acute deep vein thrombosis (DVT) of left upper extremity (HCC)   Nicotine dependence, cigarettes, uncomplicated   Intravenous drug abuse (HCC)   Chronic systolic CHF (congestive heart failure) (HCC)   History of intravenous drug abuse (HCC)   Lung field abnormal finding on examination   Left forearm infection Status post irrigation and debridement 01/03/2020. Numbness/paresthesia likely transient sensory neuropraxia secondary to postsurgical swelling and lack of of elevation.    Continue antibiotic therapy. Transition to p.o. Agree w/ Gabapentin for neuropathic pain (started 01/05/20  A.M.) WBAT -  Elevate at all times above heart. This will reduce swelling and pain. Limit strenuous use LUE. Okay for ROM, elbow, shoulder. Maintain dressings until follow-up  Follow-up in the office with Dr. Percell Miller in 7-10 days.   Charna Elizabeth Martensen III, PA-C 01/05/2020, 1:19 PM

## 2020-01-05 NOTE — Progress Notes (Signed)
[facility] Progress Note  Patient: Shaun [name]    XBJ:[id number]  DOA: 12/30/2019     Date of Service: the patient was seen and examined on 01/05/2020  Chief Complaint  Patient presents with  . Fall   Brief hospital course: History of IV drug abuse and ischemic cardiomyopathy presents with left upper extremity cellulitis.  Also recently diagnosed with approximately DVT.  Underwent incision and debridement of the wound with washout. Currently plan is continue to biotic and pain control.  Assessment and Plan: 1.  Left upper extremity cellulitis. Sepsis POA. Started on Rocephin. MRSA PCR positive therefore vancomycin was added. Continues to report pain. Underwent irrigation and debridement of upper extremity and washout. Discussed with orthopedics.  Okay to transition to oral antibiotic now that he has received 7 days of IV antibiotics. Transition to oral Bactrim to complete total 2 weeks of treatment.  2.  Left arm simply DVT. Continue Eliquis. Doppler shows no evidence of DVT but superficial thrombophlebitis.  CT concerning for possible ring-enhancing lesion monitor.  3.  History of ischemic cardiomyopathy EF 25%. On aspirin Coreg lisinopril and statin. Continue to monitor.  Currently not overloaded.  4.  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 Patient was started on scheduled OxyContin. I will discontinue OxyContin for now and transition to oral oxycodone Percocet combination as needed. Continue with Dilaudid for now. Added gabapentin dose increased. Add Flexeril.  5.  Constipation Continue bowel regimen.  6.  NSVT Currently stable. In the setting of cardiomyopathy. Monitor potassium and magnesium.   Diet: Regular diet DVT Prophylaxis: Therapeutic Anticoagulation with Eliquis   Advance goals of care discussion: Full code  Family Communication: no family was present at bedside, at the  time of interview.   Disposition:  Status is: Inpatient  Remains inpatient appropriate because:Ongoing active pain requiring inpatient pain management and Unsafe d/c plan   Dispo: The patient is from: [place]              Anticipated d/c is to: [facility]              Anticipated d/c date is: 3 days              Patient currently is not medically stable to d/c.        Subjective: Continues report 10 out of 10 pain.  No nausea no vomiting.  Poor constipation.  No fever no chills.  Reported numbness of thumb index and middle finger of the left arm.  Physical Exam:  General: Appear in marked distress, no Rash; Oral Mucosa Clear, moist. no Abnormal Neck Mass Or lumps, Conjunctiva normal  Cardiovascular: S1 and S2 Present, no Murmur, Respiratory: good respiratory effort, Bilateral Air entry present and Clear to Auscultation, no Crackles, no wheezes Abdomen: Bowel Sound present, Soft and no tenderness Extremities: Left upper extremity edema up to shoulder, no pedal edema, no calf tenderness Neurology: alert and oriented to time, place, and person affect anxious. no new focal deficit Gait not checked due to patient safety concerns  Vitals:   01/05/20 0618 01/05/20 0943 01/05/20 1316 01/05/20 1630  BP: 115/78 117/75 120/81   Pulse: 90  93 93  Resp: 18  20   Temp: 97.9 F (36.6 C)  98 F (36.7 C)   TempSrc: Oral  Oral   SpO2: 100%  98%   Weight:      Height:  Intake/Output Summary (Last 24 hours) at 01/05/2020 1955 Last data filed at 01/05/2020 1900 Gross per 24 hour  Intake 2280 ml  Output --  Net 2280 ml   Filed Weights   12/30/19 1950 01/03/20 0808  Weight: 72.6 kg 72.6 kg    Data Reviewed: I have personally reviewed and interpreted daily labs, tele strips, imagings as discussed above. I reviewed all nursing notes, pharmacy notes, vitals, pertinent old records I have discussed plan of care as described above with RN and patient/family.  CBC: Recent Labs  Lab  12/30/19 1208 12/31/19 0359 01/01/20 0830 01/03/20 0524 01/04/20 0422  WBC 11.1* 12.1* 8.2 7.4 9.0  NEUTROABS 8.7* 8.9*  --   --   --   HGB 13.9 12.7* 12.3* 11.2* 10.9*  HCT 42.6 38.3* 38.4* 35.2* 36.9*  MCV 84.5 84.4 85.7 86.3 91.1  PLT 363 337 353 423* 393   Basic Metabolic Panel: Recent Labs  Lab 12/30/19 1208 12/31/19 0359 01/01/20 0830 01/03/20 0524 01/04/20 0422  NA 132* 133* 133* 134* 134*  K 4.0 4.3 4.3 4.7 4.8  CL 95* 101 101 100 102  CO2 26 23 21* 26 22  GLUCOSE 119* 110* 112* 106* 137*  BUN 15 15 18 19  21*  CREATININE 1.03 0.82 0.86 0.85 0.62  CALCIUM 8.9 8.7* 8.3* 8.5* 8.5*  MG  --  1.8 2.1  --   --     Studies: No results found.  Scheduled Meds: . apixaban  10 mg Oral BID   Followed by  . [START ON 01/06/2020] apixaban  5 mg Oral BID  . aspirin EC  81 mg Oral Daily  . atorvastatin  40 mg Oral q1800  . carvedilol  3.125 mg Oral BID WC  . cyclobenzaprine  5 mg Oral TID  . feeding supplement (ENSURE ENLIVE)  237 mL Oral Q24H  . [START ON 01/06/2020] feeding supplement (PRO-STAT SUGAR FREE 64)  30 mL Oral Daily  . gabapentin  300 mg Oral TID  . lisinopril  10 mg Oral Daily  . melatonin  6 mg Oral QHS  . multivitamin with minerals  1 tablet Oral Daily  . nicotine  21 mg Transdermal Daily  . polyethylene glycol  17 g Oral Daily  . senna-docusate  1 tablet Oral BID  . sulfamethoxazole-trimethoprim  2 tablet Oral Q12H   Continuous Infusions: PRN Meds: acetaminophen **OR** acetaminophen, bisacodyl, HYDROmorphone (DILAUDID) injection, ondansetron **OR** ondansetron (ZOFRAN) IV, oxyCODONE-acetaminophen **AND** oxyCODONE, zolpidem  Time spent: 35 minutes  Author: 01/08/2020, MD [facility] 01/05/2020 7:55 PM  To reach On-call, see care teams to locate the attending and reach out via [email or web address]. Between 7PM-7AM, please contact night-coverage If you still have difficulty reaching the attending provider, please page the [facility] (Director on Call) for  [facility] on amion for assistance.

## 2020-01-05 NOTE — Progress Notes (Signed)
Nutrition Follow-up  DOCUMENTATION CODES:   Not applicable  INTERVENTION:  - continue Ensure Enlive but will decrease from BID to once/day.  - continue 30 ml Prostat but will decrease from BID to once/day.   NUTRITION DIAGNOSIS:   Increased nutrient needs related to acute illness, chronic illness (persisting LUE cellulitis and DVT; sCHF) as evidenced by estimated needs. -ongoing  GOAL:   Patient will meet greater than or equal to 90% of their needs -met  MONITOR:   PO intake, Supplement acceptance, Labs, Weight trends  ASSESSMENT:   34 year old male with past medical history of IV drug abuse (Heroin, Fentanyl, Cocaine), ischemic cardiomyopathy s/p MI with drug-eluting stent in 2017, sCHF (EF 25% in 2017), HTN, HLD, recent Shaun [name] hospitalization in 04/21 for cellulitis of chin and LUE with LUE DVT as well as group B strep sepsis, noted patient left AMA prior to completion of treatment. Patient was incarcerated 8 days ago and presented via officer escort with 4 day history of LUE pain, fevers, poor appetite and generalized weakness.  Weight on 6/15 recorded as exactly the same as weight on 6/11; not weighed since 6/15. He has been accepting Ensure and Prostat 50% of the time offered. Per flow sheet documentation, he recently consumed the following at meals:  6/13- 75% of lunch (649 kcal, 21 grams protein) 6/14- 100% of lunch, 100% of dinner (total of 1450 kcal, 58 grams protein) 6/15- 100% of dinner (1301 kcal, 42 grams protein) 6/16- 75% of breakfast, 100% of lunch, 100% of dinner (total of 3788 kcal, 102 grams protein) 6/17- 100% of breakfast (1252 kcal, 21 grams protein)   Per notes: - LUE cellulitis s/p I&D on 6/15 - LUE DVT - ischemic cardiomyopathy - hx of IVDU (heroin), cocaine use, fentanyl abuse - patient is from jail and will return to jail at time of d/c    Labs reviewed; Na: 134 mmol/l, BUN: 21 mg/dl, Ca: 8.5 mg/dl. Medications reviewed; 6 g melatonin/night, 1  tablet multivitamin with minerals/day, 17 g miralax/day, 1 tablet senokot BID.   Diet Order:   Diet Order            Diet regular Room service appropriate? Yes; Fluid consistency: Thin  Diet effective now                 EDUCATION NEEDS:   No education needs have been identified at this time  Skin:  Skin Assessment: Skin Integrity Issues: Skin Integrity Issues:: Incisions Incisions: L arm cellulitis s/p I&D (6/15) Other: Cellulitis;L arm  Last BM:  6/10 ?  Height:   Ht Readings from Last 1 Encounters:  12/30/19 6' (1.829 m)    Weight:   Wt Readings from Last 1 Encounters:  01/03/20 72.6 kg    Estimated Nutritional Needs:  Kcal:  2200-2400 Protein:  115-125 Fluid:  >/= 2.2 L/day     Shaun Matin, MS, RD, LDN, CNSC Inpatient Clinical Dietitian RD pager # available in AMION  After hours/weekend pager # available in [facility]

## 2020-01-06 LAB — BASIC METABOLIC PANEL
Anion gap: 5 (ref 5–15)
BUN: 20 mg/dL (ref 6–20)
CO2: 25 mmol/L (ref 22–32)
Calcium: 8.3 mg/dL — ABNORMAL LOW (ref 8.9–10.3)
Chloride: 103 mmol/L (ref 98–111)
Creatinine, Ser: 0.97 mg/dL (ref 0.61–1.24)
GFR calc Af Amer: >60 mL/min (ref >60)
GFR calc non Af Amer: >60 mL/min (ref >60)
Glucose, Bld: 91 mg/dL (ref 70–99)
Potassium: 4.3 mmol/L (ref 3.5–5.1)
Sodium: 133 mmol/L — ABNORMAL LOW (ref 135–145)

## 2020-01-06 MED ORDER — OXYCODONE HCL 5 MG PO TABS
5.0000 mg | ORAL_TABLET | Freq: Four times a day (QID) | ORAL | Status: DC | PRN
  Administered 2020-01-06 – 2020-01-08 (×8): 5 mg via ORAL
  Filled 2020-01-06 (×8): qty 1

## 2020-01-06 MED ORDER — CYCLOBENZAPRINE HCL 10 MG PO TABS
10.0000 mg | ORAL_TABLET | Freq: Three times a day (TID) | ORAL | Status: DC
  Administered 2020-01-06 – 2020-01-08 (×7): 10 mg via ORAL
  Filled 2020-01-06 (×7): qty 1

## 2020-01-06 MED ORDER — OXYCODONE-ACETAMINOPHEN 5-325 MG PO TABS
1.0000 | ORAL_TABLET | Freq: Four times a day (QID) | ORAL | Status: DC | PRN
  Administered 2020-01-06 – 2020-01-08 (×8): 1 via ORAL
  Filled 2020-01-06 (×8): qty 1

## 2020-01-06 NOTE — Plan of Care (Signed)
  Problem: Pain Managment: Goal: General experience of comfort will improve Outcome: Progressing   

## 2020-01-06 NOTE — Progress Notes (Signed)
[facility] Progress Note  Patient: Shaun [name]    SEG:[id number]  DOA: 12/30/2019     Date of Service: the patient was seen and examined on 01/06/2020  Chief Complaint  Patient presents with  . Fall   Brief hospital course: History of IV drug abuse and ischemic cardiomyopathy presents with left upper extremity cellulitis.  Also recently diagnosed with approximately DVT. Underwent incision and debridement of the wound with washout. Currently plan is continue to biotic and pain control.  Assessment and Plan: 1.  Left upper extremity cellulitis. Sepsis POA. Started on Rocephin. MRSA PCR positive therefore vancomycin was added. Continues to report pain. Underwent irrigation and debridement of upper extremity and washout. Discussed with orthopedics.  Okay to transition to oral antibiotic now that he has received 7 days of IV antibiotics. Transition to oral Bactrim to complete total 2 weeks of treatment. ESR and CRP near normal. Reevaluation by orthopedic suggest that the wound is healing well. Reassured the patient at present the evidence does not support the idea that the infection is coming back and he is covered with strong antibiotic.  2.  Left arm simply DVT. Continue Eliquis. Doppler shows no evidence of DVT but superficial thrombophlebitis.  CT concerning for possible ring-enhancing lesion monitor.  3.  History of ischemic cardiomyopathy EF 25%. On aspirin Coreg lisinopril and statin. Continue to monitor.  Currently not overloaded.  4.  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 Patient was started on scheduled OxyContin. Patient reportedly mentions severe pain #although all the time his vitals are remaining stable.  Without any tachycardia to suggest any distress. Tolerated discontinuation of the scheduled OxyContin. Tolerating discontinuation of the IV Dilaudid as well. We will go down on the  Percocet from every 4 hours to every 6 hours. Still he is receiving 40 mg of oxycodone per day. Gabapentin was added.  Dose increased to 300 3 times daily. Flexeril was added.  Dose increased to 10 mg 3 times daily.  5.  Constipation Continue bowel regimen.  6.  NSVT Currently stable. In the setting of cardiomyopathy. Monitor potassium and magnesium.  Diet: Regular diet DVT Prophylaxis: Therapeutic Anticoagulation with Eliquis   Advance goals of care discussion: Full code  Family Communication: no family was present at bedside, at the time of interview.   Disposition:  Status is: Inpatient  Remains inpatient appropriate because:Ongoing active pain requiring inpatient pain management and Unsafe d/c plan   Dispo: The patient is from: [place]              Anticipated d/c is to: [place]              Anticipated d/c date is: 1 to 2 days              Patient currently is not medically stable to d/c.    Subjective: Continues report 10 out of 10 pain.  No nausea no vomiting.  Oral intake adequate.  No fever no chills.  Still reports constipation.  Tells me that he feels that the infection is coming back.  Physical Exam:  General: Appear in marked distress, no Rash; Oral Mucosa Clear, moist. no Abnormal Neck Mass Or lumps, Conjunctiva normal  Cardiovascular: S1 and S2 Present, no Murmur, Respiratory: good respiratory effort, Bilateral Air entry present and Clear to Auscultation, no Crackles, no wheezes Abdomen: Bowel Sound present, Soft and no tenderness Extremities: Left upper extremity edema up to  shoulder, no pedal edema, no calf tenderness Neurology: alert and oriented to time, place, and person affect anxious. no new focal deficit Gait not checked due to patient safety concerns  Vitals:   01/05/20 1630 01/05/20 2041 01/06/20 0503 01/06/20 1314  BP:  104/65 109/75 118/71  Pulse: 93 93 90 95  Resp:  '20 16 19  ' Temp:  97.8 F (36.6 C) 97.7 F (36.5 C) 98 F (36.7 C)  TempSrc:   Oral Oral Oral  SpO2:  98% 99% 98%  Weight:      Height:        Intake/Output Summary (Last 24 hours) at 01/06/2020 1731 Last data filed at 01/06/2020 1230 Gross per 24 hour  Intake 960 ml  Output --  Net 960 ml   Filed Weights   12/30/19 1950 01/03/20 0808  Weight: 72.6 kg 72.6 kg    Data Reviewed: I have personally reviewed and interpreted daily labs, tele strips, imagings as discussed above. I reviewed all nursing notes, pharmacy notes, vitals, pertinent old records I have discussed plan of care as described above with RN and patient/family.  CBC: Recent Labs  Lab 12/31/19 0359 01/01/20 0830 01/03/20 0524 01/04/20 0422  WBC 12.1* 8.2 7.4 9.0  NEUTROABS 8.9*  --   --   --   HGB 12.7* 12.3* 11.2* 10.9*  HCT 38.3* 38.4* 35.2* 36.9*  MCV 84.4 85.7 86.3 91.1  PLT 337 353 423* 970   Basic Metabolic Panel: Recent Labs  Lab 12/31/19 0359 01/01/20 0830 01/03/20 0524 01/04/20 0422 01/06/20 0528  NA 133* 133* 134* 134* 133*  K 4.3 4.3 4.7 4.8 4.3  CL 101 101 100 102 103  CO2 23 21* '26 22 25  ' GLUCOSE 110* 112* 106* 137* 91  BUN '15 18 19 ' 21* 20  CREATININE 0.82 0.86 0.85 0.62 0.97  CALCIUM 8.7* 8.3* 8.5* 8.5* 8.3*  MG 1.8 2.1  --   --   --     Studies: No results found.  Scheduled Meds: . apixaban  10 mg Oral BID   Followed by  . apixaban  5 mg Oral BID  . aspirin EC  81 mg Oral Daily  . atorvastatin  40 mg Oral q1800  . carvedilol  3.125 mg Oral BID WC  . cyclobenzaprine  10 mg Oral TID  . feeding supplement (ENSURE ENLIVE)  237 mL Oral Q24H  . feeding supplement (PRO-STAT SUGAR FREE 64)  30 mL Oral Daily  . gabapentin  300 mg Oral TID  . lisinopril  10 mg Oral Daily  . melatonin  6 mg Oral QHS  . multivitamin with minerals  1 tablet Oral Daily  . nicotine  21 mg Transdermal Daily  . polyethylene glycol  17 g Oral Daily  . senna-docusate  1 tablet Oral BID  . sulfamethoxazole-trimethoprim  2 tablet Oral Q12H   Continuous Infusions: PRN Meds:  acetaminophen **OR** acetaminophen, bisacodyl, ondansetron **OR** ondansetron (ZOFRAN) IV, oxyCODONE-acetaminophen **AND** oxyCODONE, zolpidem  Time spent: 35 minutes  Author: Berle Mull, MD [facility] 01/06/2020 5:31 PM  To reach On-call, see care teams to locate the attending and reach out via [email or web address]. Between 7PM-7AM, please contact night-coverage If you still have difficulty reaching the attending provider, please page the [facility] (Director on Call) for [facility] on amion for assistance.

## 2020-01-07 MED ORDER — NAPROXEN 500 MG PO TABS
500.0000 mg | ORAL_TABLET | Freq: Three times a day (TID) | ORAL | Status: DC
  Administered 2020-01-07 – 2020-01-08 (×4): 500 mg via ORAL
  Filled 2020-01-07 (×4): qty 1

## 2020-01-07 MED ORDER — FAMOTIDINE 20 MG PO TABS
20.0000 mg | ORAL_TABLET | Freq: Every day | ORAL | Status: DC
  Administered 2020-01-07 – 2020-01-08 (×2): 20 mg via ORAL
  Filled 2020-01-07 (×2): qty 1

## 2020-01-07 NOTE — Progress Notes (Signed)
[facility] Progress Note  Patient: Shaun [name]    EUM:[id number]  DOA: 12/30/2019     Date of Service: the patient was seen and examined on 01/07/2020  Chief Complaint  Patient presents with  . Fall   Brief hospital course: History of IV drug abuse and ischemic cardiomyopathy presents with left upper extremity cellulitis.  Also recently diagnosed with approximately DVT. Underwent incision and debridement of the wound with washout. Currently plan is continue to biotic and pain control.  Assessment and Plan: 1.  Left upper extremity cellulitis. Sepsis POA. Started on Rocephin. MRSA PCR positive therefore vancomycin was added. Continues to report pain. Underwent irrigation and debridement of upper extremity and washout. Discussed with orthopedics.  Okay to transition to oral antibiotic now that he has received 7 days of IV antibiotics. Transition to oral Bactrim to complete total 2 weeks of treatment. ESR and CRP near normal. Reevaluation by orthopedic suggest that the wound is healing well. Reassured the patient at present the evidence does not support the idea that the infection is coming back and he is covered with strong antibiotic.  2.  Left arm simply DVT. Continue Eliquis. Doppler shows no evidence of DVT but superficial thrombophlebitis.  CT concerning for possible ring-enhancing lesion monitor.  3.  History of ischemic cardiomyopathy EF 25%. On aspirin Coreg lisinopril and statin. Continue to monitor.  Currently not overloaded.  4.  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 Patient was started on scheduled OxyContin. Patient reportedly mentions severe pain #although all the time his vitals are remaining stable.  Without any tachycardia to suggest any distress. Tolerated discontinuation of the scheduled OxyContin. Tolerating discontinuation of the IV Dilaudid as well. We will go down on the  Percocet from every 4 hours to every 6 hours. Still he is receiving 40 mg of oxycodone per day. Gabapentin was added.  Dose increased to 300 3 times daily. Flexeril was added.  Dose increased to 10 mg 3 times daily. Adding naproxen 3 times daily with Pepcid.  5.  Constipation Continue bowel regimen.  6.  NSVT Currently stable. In the setting of cardiomyopathy. Monitor potassium and magnesium.  Diet: Regular diet DVT Prophylaxis: Therapeutic Anticoagulation with Eliquis   Advance goals of care discussion: Full code  Family Communication: no family was present at bedside, at the time of interview.   Disposition:  Status is: Inpatient  Remains inpatient appropriate because:Ongoing active pain requiring inpatient pain management and Unsafe d/c plan   Dispo: The patient is from: [place]              Anticipated d/c is to: [place]              Anticipated d/c date is: 1 to 2 days              Patient currently is not medically stable to d/c.    Subjective: Continues report 10 out of 10 pain.  No nausea no vomiting.  No fever no chills.  Physical Exam:  General: Appear in marked distress, no Rash; Oral Mucosa Clear, moist. no Abnormal Neck Mass Or lumps, Conjunctiva normal  Cardiovascular: S1 and S2 Present, no Murmur, Respiratory: good respiratory effort, Bilateral Air entry present and Clear to Auscultation, no Crackles, no wheezes Abdomen: Bowel Sound present, Soft and no tenderness Extremities: Left upper extremity edema up to shoulder significantly improving, now we can see the definitions of muscles., no pedal  edema, no calf tenderness  Neurology: alert and oriented to time, place, and person affect anxious. no new focal deficit Gait not checked due to patient safety concerns  Vitals:   01/06/20 1314 01/06/20 1954 01/07/20 0630 01/07/20 1515  BP: 118/71 99/60 108/72 102/62  Pulse: 95 95 88 95  Resp: '19 16 16 15  ' Temp: 98 F (36.7 C) 98.1 F (36.7 C) 98.4 F (36.9 C) 98  F (36.7 C)  TempSrc: Oral Oral Oral Oral  SpO2: 98% 98% 98% 99%  Weight:      Height:        Intake/Output Summary (Last 24 hours) at 01/07/2020 1909 Last data filed at 01/07/2020 1800 Gross per 24 hour  Intake 1440 ml  Output --  Net 1440 ml   Filed Weights   12/30/19 1950 01/03/20 0808  Weight: 72.6 kg 72.6 kg    Data Reviewed: I have personally reviewed and interpreted daily labs, tele strips, imagings as discussed above. I reviewed all nursing notes, pharmacy notes, vitals, pertinent old records I have discussed plan of care as described above with RN and patient/family.  CBC: Recent Labs  Lab 01/01/20 0830 01/03/20 0524 01/04/20 0422  WBC 8.2 7.4 9.0  HGB 12.3* 11.2* 10.9*  HCT 38.4* 35.2* 36.9*  MCV 85.7 86.3 91.1  PLT 353 423* 335   Basic Metabolic Panel: Recent Labs  Lab 01/01/20 0830 01/03/20 0524 01/04/20 0422 01/06/20 0528  NA 133* 134* 134* 133*  K 4.3 4.7 4.8 4.3  CL 101 100 102 103  CO2 21* '26 22 25  ' GLUCOSE 112* 106* 137* 91  BUN 18 19 21* 20  CREATININE 0.86 0.85 0.62 0.97  CALCIUM 8.3* 8.5* 8.5* 8.3*  MG 2.1  --   --   --     Studies: No results found.  Scheduled Meds: . apixaban  5 mg Oral BID  . aspirin EC  81 mg Oral Daily  . atorvastatin  40 mg Oral q1800  . carvedilol  3.125 mg Oral BID WC  . cyclobenzaprine  10 mg Oral TID  . famotidine  20 mg Oral Daily  . feeding supplement (ENSURE ENLIVE)  237 mL Oral Q24H  . feeding supplement (PRO-STAT SUGAR FREE 64)  30 mL Oral Daily  . gabapentin  300 mg Oral TID  . lisinopril  10 mg Oral Daily  . melatonin  6 mg Oral QHS  . multivitamin with minerals  1 tablet Oral Daily  . naproxen  500 mg Oral TID WC  . nicotine  21 mg Transdermal Daily  . polyethylene glycol  17 g Oral Daily  . senna-docusate  1 tablet Oral BID  . sulfamethoxazole-trimethoprim  2 tablet Oral Q12H   Continuous Infusions: PRN Meds: acetaminophen **OR** acetaminophen, bisacodyl, ondansetron **OR** ondansetron  (ZOFRAN) IV, oxyCODONE-acetaminophen **AND** oxyCODONE, zolpidem  Time spent: 35 minutes  Author: Berle Mull, MD [facility] 01/07/2020 7:09 PM  To reach On-call, see care teams to locate the attending and reach out via [email or web address]. Between 7PM-7AM, please contact night-coverage If you still have difficulty reaching the attending provider, please page the [facility] (Director on Call) for [facility] on amion for assistance.

## 2020-01-07 NOTE — Plan of Care (Signed)

## 2020-01-08 MED ORDER — CARVEDILOL 3.125 MG PO TABS: 3.1250 mg | ORAL_TABLET | Freq: Two times a day (BID) | ORAL | 0 refills | Status: DC

## 2020-01-08 MED ORDER — OXYCODONE-ACETAMINOPHEN 5-325 MG PO TABS: 1.0000 | ORAL_TABLET | Freq: Three times a day (TID) | ORAL | 0 refills | Status: AC | PRN

## 2020-01-08 MED ORDER — FAMOTIDINE 20 MG PO TABS: 20.0000 mg | ORAL_TABLET | Freq: Every day | ORAL | 0 refills | Status: DC

## 2020-01-08 MED ORDER — CYCLOBENZAPRINE HCL 10 MG PO TABS: 10.0000 mg | ORAL_TABLET | Freq: Three times a day (TID) | ORAL | 0 refills | Status: DC

## 2020-01-08 MED ORDER — POLYETHYLENE GLYCOL 3350 17 G PO PACK: 17.0000 g | PACK | Freq: Every day | ORAL | 0 refills | Status: DC

## 2020-01-08 MED ORDER — APIXABAN 5 MG PO TABS: 5.0000 mg | ORAL_TABLET | Freq: Two times a day (BID) | ORAL | 0 refills | Status: DC

## 2020-01-08 MED ORDER — ASPIRIN 81 MG PO TBEC: 81.0000 mg | DELAYED_RELEASE_TABLET | Freq: Every day | ORAL | 0 refills | Status: DC

## 2020-01-08 MED ORDER — LISINOPRIL 10 MG PO TABS: 10.0000 mg | ORAL_TABLET | Freq: Every day | ORAL | 0 refills | Status: DC

## 2020-01-08 MED ORDER — GABAPENTIN 300 MG PO CAPS: 300.0000 mg | ORAL_CAPSULE | Freq: Three times a day (TID) | ORAL | 0 refills | Status: DC

## 2020-01-08 MED ORDER — ATORVASTATIN CALCIUM 40 MG PO TABS: 40.0000 mg | ORAL_TABLET | Freq: Every day | ORAL | 0 refills | Status: DC

## 2020-01-08 MED ORDER — NICOTINE 21 MG/24HR TD PT24: 21.0000 mg | MEDICATED_PATCH | Freq: Every day | TRANSDERMAL | 0 refills | Status: DC

## 2020-01-08 MED ORDER — SULFAMETHOXAZOLE-TRIMETHOPRIM 800-160 MG PO TABS: 2.0000 | ORAL_TABLET | Freq: Two times a day (BID) | ORAL | 0 refills | Status: AC

## 2020-01-08 MED ORDER — NAPROXEN 500 MG PO TABS: 500.0000 mg | ORAL_TABLET | Freq: Three times a day (TID) | ORAL | 0 refills | Status: AC

## 2020-01-08 NOTE — Progress Notes (Signed)
Pt discharged into custody of sheriff to return to jail. [employer] AVS provided to sheriff per CM. No immediate questions or concerns at this time.

## 2020-01-09 NOTE — Discharge Summary (Signed)
[facility] Discharge Summary   Patient: Shaun [name] EYC:[id number]  PCP: Patient, No Pcp Per  Date of admission: 12/30/2019   Date of discharge: 01/08/2020      Discharge Diagnoses:  Principal diagnosis Sepsis due to left upper extremity cellulitis and abscess POA Active Problems:   Coronary artery disease involving native coronary artery of native heart without angina pectoris   Essential hypertension   Mixed hyperlipidemia   Cellulitis of left upper extremity   Acute deep vein thrombosis (DVT) of left upper extremity (HCC)   Nicotine dependence, cigarettes, uncomplicated   Intravenous drug abuse ([place])   Chronic systolic CHF (congestive heart failure) ([place])   History of intravenous drug abuse ([place])   Lung field abnormal finding on examination  Admitted From: Jail Disposition:   Jail  Recommendations for Outpatient Follow-up:  1. PCP: please follow up with PCP in 1 week 2. Follow up LABS/TEST:  none   Follow-up Information    Renette Butters, MD In 10 days.   Specialty: Orthopedic Surgery Contact information: [address] [place] [address] [phone]        Vickii Chafe., DO. Schedule an appointment as soon as possible for a visit in 1 month(s).   Specialty: Cardiology Contact information: [address] [place] [address] [phone]              Diet recommendation: Cardiac diet  Activity: The patient is advised to gradually reintroduce usual activities, as tolerated  Discharge Condition: stable  Code Status: Full code   History of present illness: As per the H and P dictated on admission, "34 year old male with past medical history of intravenous drug abuse (Heroin, Fentanyl, Cocaine), ischemic cardiomyopathy status post MI with drug-eluting stent in 6767, systolic congestive heart failure (echo 2017 with EF 25%), hypertension, hyperlipidemia who presents to [facility] emergency department  complaints of left upper extremity pain.  Of note, patient was hospitalized at [facility] health in April 2021.  During that hospitalization patient was found to have cellulitis of the chin and left upper extremity with concurrent left upper extremity DVT.  Patient was also found to have group B strep sepsis.  Patient was initially treated with anticoagulation and intravenous antibiotic therapy however patient ended up leaving AMA prior to completion of treatment.  Patient left without receiving any home-going anticoagulation or antibiotics.  Patient was incarcerated 8 days ago.  Patient explains that approximately 4 days ago the pain in his left upper extremity began to worsen.  Pain begins in the forearm and radiates proximally.  Pain is 10 out of 10 in intensity, burning to sharp in quality, worse with movement of the left upper extremity.  Patient also complains of poor appetite and generalized weakness.  Patient is also complaining of subjective fevers.  Patient admits to continued injection of various drugs including fentanyl even in that arm with last injection 8 days ago.  Patient was brought in to [facility] emergency department for evaluation and is being escorted by an Garment/textile technologist.  Upon evaluation in the emergency department patient is clinically felt to be suffering from persisting left upper extremity cellulitis and suspected persisting left upper extremity DVT.  Patient was provided with Eliquis 10 mg, ceftriaxone 2 g and 1 g of vancomycin.  Patient was also provided with 1 L of normal saline.  Blood cultures were obtained.  The hospitalist group was then called to assess the patient for admission  the hospital."  Hospital Course:   Summary of his active problems in the hospital is as following. 1.  Left upper extremity cellulitis. Sepsis POA. Started on Rocephin. MRSA PCR positive therefore vancomycin was added. Continues to report pain. Underwent irrigation and  debridement of upper extremity and washout. Discussed with orthopedics.  Okay to transition to oral antibiotic now that he has received 7 days of IV antibiotics. Transition to oral Bactrim to complete total 2 weeks of treatment. ESR and CRP near normal. Reevaluation by orthopedic suggest that the wound is healing well. Reassured the patient at present the evidence does not support the idea that the infection is coming back and he is covered with an antibiotic.  2.  Left arm DVT. Continue Eliquis. Doppler shows no evidence of DVT but superficial thrombophlebitis.  CT concerning for possible ring-enhancing lesion  monitor.  3.  History of ischemic cardiomyopathy EF 25%. On aspirin Coreg lisinopril and statin. Continue to monitor.  Currently not overloaded.  4.  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 Patient was started on scheduled OxyContin. Patient reportedly mentions severe pain, although all the time his vitals are remaining stable.  Without any tachycardia to suggest any distress. Tolerated discontinuation of the scheduled OxyContin. Tolerating discontinuation of the IV Dilaudid as well. We will go down on the Percocet from every 4 hours to every 6 hours. Still he is receiving 40 mg of oxycodone per day. Gabapentin was added.  Dose increased to 300 3 times daily. Flexeril was added.  Dose increased to 10 mg 3 times daily. Adding naproxen 3 times daily with Pepcid.  5.  Constipation Continue bowel regimen.  6.  NSVT resolved  Currently stable. In the setting of cardiomyopathy.   Patient was ambulatory without any assistance. On the day of the discharge the patient's vitals were stable, and no other acute medical condition were reported by patient. the patient was felt safe to be discharge at jail with no therapy needed on discharge.  Consultants: orthopedics Dr Percell Miller Procedures: [place] LEFT UPPER EXTREMITY  Discharge Exam: General: Appear in mild distress, no Rash; Oral Mucosa Clear, moist. Cardiovascular: S1 and S2 Present, no Murmur, Respiratory: normal respiratory effort, Bilateral Air entry present and no Crackles, no wheezes Abdomen: Bowel Sound present, Soft and no tenderness, no hernia Extremities: no Pedal edema, no calf tenderness Neurology: alert and oriented to time, place, and person affect appropriate.  Filed Weights   12/30/19 1950 01/03/20 0808  Weight: 72.6 kg 72.6 kg   Vitals:   01/07/20 2023 01/08/20 0621  BP: 107/66 103/73  Pulse: 96 92  Resp: 18 18  Temp: 97.9 F (36.6 C) 97.9 F (36.6 C)  SpO2: 98% 96%    DISCHARGE MEDICATION: Allergies as of 01/08/2020      Reactions   Codeine Itching   Penicillins    Itching Did it involve swelling of the face/tongue/throat, SOB, or low BP? SOB & Swelling Which Caused Blood Clots Did it involve sudden or severe rash/hives, skin peeling, or any reaction on the inside of your mouth or nose? No Did you need to seek medical attention at a hospital or doctor's office? Y When did it last happen?Over month ago If all above answers are "NO", may proceed with cephalosporin use.      Medication List    STOP taking these medications   lisinopril-hydrochlorothiazide 20-12.5 MG tablet Commonly known as: ZESTORETIC  TAKE these medications   apixaban 5 MG Tabs tablet Commonly known as: ELIQUIS Take 1 tablet (5 mg total) by mouth 2 (two) times daily.   aspirin 81 MG EC tablet Take 1 tablet (81 mg total) by mouth daily. Swallow whole.   atorvastatin 40 MG tablet Commonly known as: LIPITOR Take 1 tablet (40 mg total) by mouth daily at 6 PM. What changed:   medication strength  how much to take  when to take this   carvedilol 3.125 MG tablet Commonly known as: COREG Take 1 tablet (3.125 mg total) by mouth 2 (two) times daily with a meal.   cyclobenzaprine 10 MG  tablet Commonly known as: FLEXERIL Take 1 tablet (10 mg total) by mouth 3 (three) times daily.   famotidine 20 MG tablet Commonly known as: PEPCID Take 1 tablet (20 mg total) by mouth daily for 6 days.   FLUoxetine 20 MG capsule Commonly known as: PROZAC Take 20 mg by mouth daily.   gabapentin 300 MG capsule Commonly known as: NEURONTIN Take 1 capsule (300 mg total) by mouth 3 (three) times daily for 20 days.   KEPPRA PO Take 1 tablet by mouth daily.   lisinopril 10 MG tablet Commonly known as: ZESTRIL Take 1 tablet (10 mg total) by mouth daily.   naproxen 500 MG tablet Commonly known as: NAPROSYN Take 1 tablet (500 mg total) by mouth 3 (three) times daily with meals for 6 days. Notes to patient: With meals   nicotine 21 mg/24hr patch Commonly known as: NICODERM CQ - dosed in mg/24 hours Place 1 patch (21 mg total) onto the skin daily.   oxyCODONE-acetaminophen 5-325 MG tablet Commonly known as: Percocet Take 1-2 tablets by mouth every 8 (eight) hours as needed for up to 5 days for severe pain.   polyethylene glycol 17 g packet Commonly known as: MIRALAX / GLYCOLAX Take 17 g by mouth daily.   sulfamethoxazole-trimethoprim 800-160 MG tablet Commonly known as: BACTRIM DS Take 2 tablets by mouth 2 (two) times daily for 4 days.            Discharge Care Instructions  (From admission, onward)         Start     Ordered   01/08/20 0000  Leave dressing on - Keep it clean, dry, and intact until clinic visit        01/08/20 0717         Allergies  Allergen Reactions  . Codeine Itching  . Penicillins     Itching Did it involve swelling of the face/tongue/throat, SOB, or low BP? SOB & Swelling Which Caused Blood Clots Did it involve sudden or severe rash/hives, skin peeling, or any reaction on the inside of your mouth or nose? No Did you need to seek medical attention at a hospital or doctor's office? Y When did it last happen?Over month ago If all  above answers are "NO", may proceed with cephalosporin use.     Discharge Instructions    Diet - low sodium heart healthy   Complete by: As directed    Increase activity slowly   Complete by: As directed    Leave dressing on - Keep it clean, dry, and intact until clinic visit   Complete by: As directed       The results of significant diagnostics from this hospitalization (including imaging, microbiology, ancillary and laboratory) are listed below for reference.    Significant Diagnostic Studies: DG Chest 1 View  Result Date: 12/30/2019  CLINICAL DATA:  History of IVDA with pain and swelling in the left upper extremity EXAM: CHEST  1 VIEW COMPARISON:  11/15/2018 FINDINGS: Cardiac shadow is stable. Coronary stenting is noted. Lungs are well aerated bilaterally. No focal infiltrate or sizable effusion is seen. No bony abnormality is noted. IMPRESSION: No acute abnormality seen. Electronically Signed   By: Inez Catalina M.D.   On: 12/30/2019 20:27   DG Forearm Left  Result Date: 12/30/2019 CLINICAL DATA:  Pain in LEFT forearm unable to rotate arm into full position. EXAM: LEFT FOREARM - 2 VIEW COMPARISON:  RIGHT wrist of 02/07/2019 FINDINGS: Extensive soft tissue swelling about the LEFT forearm. No gas in the soft tissues by plain film radiography. No sign of fracture or dislocation. IMPRESSION: Marked soft tissue swelling about the LEFT forearm. No acute osseous abnormality. Correlate with any signs of infection. Electronically Signed   By: Zetta Bills M.D.   On: 12/30/2019 14:07   CT FOREARM LEFT W CONTRAST  Result Date: 01/01/2020 CLINICAL DATA:  Left forearm and hand swelling. History of IV drug use. EXAM: CT OF THE UPPER LEFT EXTREMITY WITH CONTRAST TECHNIQUE: Multidetector CT imaging of the left forearm and hand was performed according to the standard protocol following intravenous contrast administration. CONTRAST:  157m OMNIPAQUE IOHEXOL 300 MG/ML  SOLN COMPARISON:  Left forearm  x-ray dated December 30, 2019. FINDINGS: Bones/Joint/Cartilage No bony destruction or periosteal reaction. No fracture or dislocation. Joint spaces are preserved. No joint effusion. Bone mineralization is normal. Ligaments Ligaments are suboptimally evaluated by CT. Muscles and Tendons Grossly intact.  No definite muscle edema.  No muscle atrophy. Soft tissue Acute occlusive superficial thrombosis of the cephalic vein beginning in the mid forearm and extending to the wrist (series 10, image 48). Extension of acute occlusive thrombus within superficial wrist radial and dorsal branch veins arising from the cephalic vein (series 10, image 43). The basilic and deep veins are patent. There is a curvilinear 1.2 x 0.7 x 1.7 cm rim enhancing fluid collection adjacent to the occluded cephalic vein in the mid to distal forearm (series 9, image 129). No additional fluid collection. Diffuse soft tissue swelling of the forearm extending into the dorsal and radial aspect of the hand. No subcutaneous emphysema. IMPRESSION: 1. Acute occlusive superficial thrombosis of the cephalic vein beginning in the mid forearm and extending to the wrist. Extension of acute occlusive thrombus within superficial wrist radial and dorsal branch veins arising from the cephalic vein. Remaining veins are patent. 2. 1.7 cm curvilinear rim enhancing fluid collection adjacent to the occluded cephalic vein in the mid to distal forearm, concerning for small abscess. 3. Diffuse soft tissue swelling of the forearm extending into the dorsal and radial aspect of the hand. No evidence of deeper infection. 4. No acute osseous abnormality. Electronically Signed   By: WTitus DubinM.D.   On: 01/01/2020 13:27   ECHOCARDIOGRAM COMPLETE  Result Date: 12/31/2019    ECHOCARDIOGRAM REPORT   Patient Name:   JKALIX MEINECKECOMBS Date of Exam: 12/31/2019 Medical Rec #:  [id number]       Height:       72.0 in Accession #:    [id number]      Weight:       160.0 lb Date of  Birth:  10/04/1985-11-08       BSA:          1.938 m Patient Age:    89years  BP:           123/89 mmHg Patient Gender: M                HR:           88 bpm. Exam Location:  Inpatient Procedure: 2D Echo, Color Doppler and Cardiac Doppler Indications:    O67.12 Acute systolic (congestive) heart failure  History:        Patient has prior history of Echocardiogram examinations, most                 recent 11/15/2019. CHF, CAD; Risk Factors:Hypertension,                 Dyslipidemia and IVDU. Prior performed at [facility]:    [place] Referring Phys: [id number] [place]  1. Diffuse hypokinesis worse in septum Old echo from [place] not available but has had EF described in 25% range 2017 . Left ventricular ejection fraction, by estimation, is 25 to 30%. The left ventricle has severely decreased function. The left ventricle demonstrates global hypokinesis. The left ventricular internal cavity size was severely dilated. Left ventricular diastolic parameters were normal.  2. Right ventricular systolic function is normal. The right ventricular size is normal.  3. The mitral valve is normal in structure. Mild mitral valve regurgitation. No evidence of mitral stenosis.  4. The aortic valve is tricuspid. Aortic valve regurgitation is not visualized. No aortic stenosis is present.  5. The inferior vena cava is dilated in size with >50% respiratory variability, suggesting right atrial pressure of 8 mmHg. FINDINGS  Left Ventricle: Diffuse hypokinesis worse in septum Old echo from [place] not available but has had EF described in 25% range 2017. Left ventricular ejection fraction, by estimation, is 25 to 30%. The left ventricle has severely decreased function. The left ventricle demonstrates global hypokinesis. The left ventricular internal cavity size was severely dilated. There is no left ventricular hypertrophy. Left ventricular diastolic parameters were normal. Right Ventricle: The right  ventricular size is normal. No increase in right ventricular wall thickness. Right ventricular systolic function is normal. Left Atrium: Left atrial size was normal in size. Right Atrium: Right atrial size was normal in size. Pericardium: There is no evidence of pericardial effusion. Mitral Valve: The mitral valve is normal in structure. There is mild thickening of the mitral valve leaflet(s). Normal mobility of the mitral valve leaflets. Mild mitral valve regurgitation. No evidence of mitral valve stenosis. There is no evidence of mitral valve vegetation. Tricuspid Valve: The tricuspid valve is normal in structure. Tricuspid valve regurgitation is mild . No evidence of tricuspid stenosis. There is no evidence of tricuspid valve vegetation. Aortic Valve: The aortic valve is tricuspid. Aortic valve regurgitation is not visualized. No aortic stenosis is present. There is no evidence of aortic valve vegetation. Pulmonic Valve: The pulmonic valve was not well visualized. Pulmonic valve regurgitation is not visualized. No evidence of pulmonic stenosis. Aorta: The aortic root is normal in size and structure. Venous: The inferior vena cava is dilated in size with greater than 50% respiratory variability, suggesting right atrial pressure of 8 mmHg. IAS/Shunts: No atrial level shunt detected by color flow Doppler.  LEFT VENTRICLE PLAX 2D LVIDd:         6.30 cm      Diastology LVIDs:         5.90 cm      LV e' lateral: 7.62 cm/s LV PW:  0.90 cm      LV e' medial:  6.20 cm/s LV IVS:        0.80 cm LVOT diam:     2.10 cm LV SV:         41 LV SV Index:   21 LVOT Area:     3.46 cm  LV Volumes (MOD) LV vol d, MOD A2C: 228.0 ml LV vol d, MOD A4C: 277.0 ml LV vol s, MOD A2C: 184.0 ml LV vol s, MOD A4C: 183.0 ml LV SV MOD A2C:     44.0 ml LV SV MOD A4C:     277.0 ml LV SV MOD BP:      65.6 ml RIGHT VENTRICLE RV S prime:     12.70 cm/s TAPSE (M-mode): 1.4 cm LEFT ATRIUM              Index       RIGHT ATRIUM           Index LA  diam:        3.30 cm  1.70 cm/m  RA Area:     15.20 cm LA Vol (A2C):   102.0 ml 52.64 ml/m RA Volume:   43.20 ml  22.29 ml/m LA Vol (A4C):   57.4 ml  29.62 ml/m LA Biplane Vol: 80.4 ml  41.49 ml/m  AORTIC VALVE LVOT Vmax:   70.50 cm/s LVOT Vmean:  55.400 cm/s LVOT VTI:    0.118 m  AORTA Ao Root diam: 3.10 cm  SHUNTS Systemic VTI:  0.12 m Systemic Diam: 2.10 cm Jenkins Rouge MD Electronically signed by Jenkins Rouge MD Signature Date/Time: 12/31/2019/11:40:51 AM    Final    VAS [place] UPPER EXTREMITY VENOUS DUPLEX  Result Date: 01/01/2020 UPPER VENOUS STUDY  Indications: increased swelling/ redness left arm, IV drug use Risk Factors: DVT Hx. Comparison Study: Previous study at outside facility 10/2019: left axillary and                   brachial DVT Performing Technologist: June Leap RDMS, RVT  Examination Guidelines: A complete evaluation includes B-mode imaging, spectral Doppler, color Doppler, and power Doppler as needed of all accessible portions of each vessel. Bilateral testing is considered an integral part of a complete examination. Limited examinations for reoccurring indications may be performed as noted.  Left Findings: +----------+------------+---------+-----------+----------+-------+ LEFT      CompressiblePhasicitySpontaneousPropertiesSummary +----------+------------+---------+-----------+----------+-------+ IJV           Full       Yes       Yes                      +----------+------------+---------+-----------+----------+-------+ Subclavian    Full       Yes       Yes                      +----------+------------+---------+-----------+----------+-------+ Axillary      Full       Yes       Yes                      +----------+------------+---------+-----------+----------+-------+ Brachial      Full                                          +----------+------------+---------+-----------+----------+-------+ Radial  Full                                           +----------+------------+---------+-----------+----------+-------+ Ulnar         Full                                          +----------+------------+---------+-----------+----------+-------+ Cephalic      None                                   Acute  +----------+------------+---------+-----------+----------+-------+ Basilic       None                                   Acute  +----------+------------+---------+-----------+----------+-------+ Fluid noted around the cephalic vein in the forearm, which could indicate inflammation/ infection  Summary:  Left: No evidence of deep vein thrombosis in the upper extremity. Findings consistent with acute superficial vein thrombosis involving the left basilic vein and left cephalic vein.  *See table(s) above for measurements and observations.  Diagnosing physician: Monica Martinez MD Electronically signed by Monica Martinez MD on 01/01/2020 at 11:14:33 AM.    Final     Microbiology: Recent Results (from the past 240 hour(s))  Blood culture (routine x 2)     Status: None   Collection Time: 12/30/19  4:04 PM   Specimen: BLOOD  Result Value Ref Range Status   Specimen Description   Final    BLOOD RIGHT ARM Performed at [facility], [place] [address], [place], [place] [address]    Special Requests   Final    BOTTLES DRAWN AEROBIC AND ANAEROBIC Blood Culture adequate volume Performed at [place] [address] [place], [place] [address]    Culture   Final    NO GROWTH 5 DAYS Performed at [facility], [place] [address], [place], [place] [address]    Report Status 01/04/2020 FINAL  Final  SARS Coronavirus 2 by RT PCR (hospital order, performed in [facility] hospital lab) Nasopharyngeal Nasopharyngeal Swab     Status: None   Collection Time: 12/30/19  7:47 PM   Specimen: Nasopharyngeal Swab  Result Value Ref Range Status   SARS Coronavirus 2 NEGATIVE NEGATIVE Final    Comment:  (NOTE) SARS-CoV-2 target nucleic acids are NOT DETECTED.  The SARS-CoV-2 RNA is generally detectable in upper and lower respiratory specimens during the acute phase of infection. The lowest concentration of SARS-CoV-2 viral copies this assay can detect is 250 copies / mL. A negative result does not preclude SARS-CoV-2 infection and should not be used as the sole basis for treatment or other patient management decisions.  A negative result may occur with improper specimen collection / handling, submission of specimen other than nasopharyngeal swab, presence of viral mutation(s) within the areas targeted by this assay, and inadequate number of viral copies (<250 copies / mL). A negative result must be combined with clinical observations, patient history, and epidemiological information.  Fact Sheet for Patients:   StrictlyIdeas.no  Fact Sheet for Healthcare Providers: BankingDealers.co.za  This test is not yet approved or  cleared by the [place]  States FDA and has been authorized for detection and/or diagnosis of SARS-CoV-2 by FDA under an Emergency Use Authorization (EUA).  This EUA will remain in effect (meaning this test can be used) for the duration of the COVID-19 declaration under Section 564(b)(1) of the Act, 21 U.S.C. section 360bbb-3(b)(1), unless the authorization is terminated or revoked sooner.  Performed at [facility], [place] [address], [place], [place] [address]   MRSA PCR Screening     Status: Abnormal   Collection Time: 12/30/19 11:57 PM   Specimen: Nasal Mucosa; Nasopharyngeal  Result Value Ref Range Status   MRSA by PCR POSITIVE (A) NEGATIVE Final    Comment:        The GeneXpert MRSA Assay (FDA approved for NASAL specimens only), is one component of a comprehensive MRSA colonization surveillance program. It is not intended to diagnose MRSA infection nor to guide or monitor treatment for MRSA  infections. RESULT CALLED TO, READ BACK BY AND VERIFIED WITH: Emi Holes @ [phone] 12/31/2019 Rikki Spearing. Performed at [facility], [place] [address], [place], [place] [address]      Labs: CBC: Recent Labs  Lab 01/03/20 0524 01/04/20 0422  WBC 7.4 9.0  HGB 11.2* 10.9*  HCT 35.2* 36.9*  MCV 86.3 91.1  PLT 423* 356   Basic Metabolic Panel: Recent Labs  Lab 01/03/20 0524 01/04/20 0422 01/06/20 0528  NA 134* 134* 133*  K 4.7 4.8 4.3  CL 100 102 103  CO2 '26 22 25  ' GLUCOSE 106* 137* 91  BUN 19 21* 20  CREATININE 0.85 0.62 0.97  CALCIUM 8.5* 8.5* 8.3*   Time spent: 35 minutes  Signed:  Berle Mull  [facility] 01/08/2020  11:52 AM

## 2020-03-03 ENCOUNTER — Emergency Department (HOSPITAL_COMMUNITY)

## 2020-03-03 ENCOUNTER — Emergency Department (HOSPITAL_COMMUNITY)
Admission: EM | Admit: 2020-03-03 | Discharge: 2020-03-04 | Disposition: A | Discharge status: Home / Self Care | Attending: Emergency Medicine | Admitting: Emergency Medicine

## 2020-03-03 DIAGNOSIS — Y9389 Activity, other specified: Secondary | ICD-10-CM | POA: Diagnosis not present

## 2020-03-03 DIAGNOSIS — R42 Dizziness and giddiness: Secondary | ICD-10-CM

## 2020-03-03 DIAGNOSIS — I110 Hypertensive heart disease with heart failure: Secondary | ICD-10-CM | POA: Insufficient documentation

## 2020-03-03 DIAGNOSIS — Z7982 Long term (current) use of aspirin: Secondary | ICD-10-CM | POA: Insufficient documentation

## 2020-03-03 DIAGNOSIS — L739 Follicular disorder, unspecified: Secondary | ICD-10-CM | POA: Diagnosis not present

## 2020-03-03 DIAGNOSIS — L0232 Furuncle of buttock: Secondary | ICD-10-CM

## 2020-03-03 DIAGNOSIS — W228XXA Striking against or struck by other objects, initial encounter: Secondary | ICD-10-CM | POA: Diagnosis not present

## 2020-03-03 DIAGNOSIS — Z79899 Other long term (current) drug therapy: Secondary | ICD-10-CM | POA: Diagnosis not present

## 2020-03-03 DIAGNOSIS — Y999 Unspecified external cause status: Secondary | ICD-10-CM | POA: Insufficient documentation

## 2020-03-03 DIAGNOSIS — Y9289 Other specified places as the place of occurrence of the external cause: Secondary | ICD-10-CM | POA: Insufficient documentation

## 2020-03-03 DIAGNOSIS — I2510 Atherosclerotic heart disease of native coronary artery without angina pectoris: Secondary | ICD-10-CM | POA: Diagnosis not present

## 2020-03-03 DIAGNOSIS — S0990XA Unspecified injury of head, initial encounter: Secondary | ICD-10-CM

## 2020-03-03 DIAGNOSIS — F17210 Nicotine dependence, cigarettes, uncomplicated: Secondary | ICD-10-CM | POA: Insufficient documentation

## 2020-03-03 DIAGNOSIS — I5022 Chronic systolic (congestive) heart failure: Secondary | ICD-10-CM | POA: Insufficient documentation

## 2020-03-03 LAB — COMPREHENSIVE METABOLIC PANEL
ALT: 59 U/L — ABNORMAL HIGH (ref 0–44)
AST: 44 U/L — ABNORMAL HIGH (ref 15–41)
Albumin: 3.7 g/dL (ref 3.5–5.0)
Alkaline Phosphatase: 81 U/L (ref 38–126)
Anion gap: 8 (ref 5–15)
BUN: 17 mg/dL (ref 6–20)
CO2: 25 mmol/L (ref 22–32)
Calcium: 8.9 mg/dL (ref 8.9–10.3)
Chloride: 107 mmol/L (ref 98–111)
Creatinine, Ser: 1.04 mg/dL (ref 0.61–1.24)
GFR calc Af Amer: >60 mL/min (ref >60)
GFR calc non Af Amer: >60 mL/min (ref >60)
Glucose, Bld: 95 mg/dL (ref 70–99)
Potassium: 4.1 mmol/L (ref 3.5–5.1)
Sodium: 140 mmol/L (ref 135–145)
Total Bilirubin: 0.2 mg/dL — ABNORMAL LOW (ref 0.3–1.2)
Total Protein: 7.5 g/dL (ref 6.5–8.1)

## 2020-03-03 LAB — CBC WITH DIFFERENTIAL/PLATELET
Abs Immature Granulocytes: 0.01 10*3/uL (ref 0.00–0.07)
Basophils Absolute: 0 10*3/uL (ref 0.0–0.1)
Basophils Relative: 0 %
Eosinophils Absolute: 0.2 10*3/uL (ref 0.0–0.5)
Eosinophils Relative: 5 %
HCT: 39.1 % (ref 39.0–52.0)
Hemoglobin: 12.3 g/dL — ABNORMAL LOW (ref 13.0–17.0)
Immature Granulocytes: 0 %
Lymphocytes Relative: 30 %
Lymphs Abs: 1.5 10*3/uL (ref 0.7–4.0)
MCH: 27.5 pg (ref 26.0–34.0)
MCHC: 31.5 g/dL (ref 30.0–36.0)
MCV: 87.3 fL (ref 80.0–100.0)
Monocytes Absolute: 0.4 10*3/uL (ref 0.1–1.0)
Monocytes Relative: 8 %
Neutro Abs: 2.9 10*3/uL (ref 1.7–7.7)
Neutrophils Relative %: 57 %
Platelets: 214 10*3/uL (ref 150–400)
RBC: 4.48 MIL/uL (ref 4.22–5.81)
RDW: 14.6 % (ref 11.5–15.5)
WBC: 5.1 10*3/uL (ref 4.0–10.5)
nRBC: 0 % (ref 0.0–0.2)

## 2020-03-03 MED ORDER — DIPHENHYDRAMINE HCL 50 MG/ML IJ SOLN
25.0000 mg | Freq: Once | INTRAMUSCULAR | Status: AC
  Administered 2020-03-03: 25 mg via INTRAVENOUS
  Filled 2020-03-03: qty 1

## 2020-03-03 MED ORDER — PROCHLORPERAZINE EDISYLATE 10 MG/2ML IJ SOLN
10.0000 mg | Freq: Once | INTRAMUSCULAR | Status: AC
  Administered 2020-03-03: 10 mg via INTRAVENOUS
  Filled 2020-03-03: qty 2

## 2020-03-03 MED ORDER — KETOROLAC TROMETHAMINE 30 MG/ML IJ SOLN
30.0000 mg | Freq: Once | INTRAMUSCULAR | Status: AC
  Administered 2020-03-03: 30 mg via INTRAVENOUS
  Filled 2020-03-03: qty 1

## 2020-03-03 NOTE — ED Triage Notes (Signed)
PT BIB by Police from detention center after pt reported hitting his head against the wall after feeling dizzy and light headed.  Pt reports feeling hot and cold. Pt reports feeling as though, " his body has an infection". Per medical staff at facility pt is taking blood thinners.

## 2020-03-04 MED ORDER — DOXYCYCLINE HYCLATE 100 MG PO CAPS
100.0000 mg | ORAL_CAPSULE | Freq: Two times a day (BID) | ORAL | 0 refills | Status: AC
Start: 2020-03-04 — End: 2020-03-11

## 2020-03-04 MED ORDER — ACETAMINOPHEN 500 MG PO TABS
1000.0000 mg | ORAL_TABLET | Freq: Once | ORAL | Status: AC
  Administered 2020-03-04: 1000 mg via ORAL
  Filled 2020-03-04: qty 2

## 2020-03-04 NOTE — ED Notes (Signed)
Patient verbalizes understanding of discharge instructions. Opportunity for questioning and answers were provided. Armband removed by staff, pt discharged from ED and ambulated to lobby with police to return to detention center.

## 2020-03-04 NOTE — ED Provider Notes (Signed)
[facility] EMERGENCY DEPARTMENT Provider Note   CSN: [id number] Arrival date & time: 03/03/20  2009     History Chief Complaint  Patient presents with  . Head Injury    Shaun [name] is a 34 y.o. male.  HPI      34 year old male with history of IV drug abuse, ischemic cardiomyopathy EF 25%, DVT on Eliquis, recent admission for left upper extremity cellulitis and sepsis presents from jail with concern for dizziness and fall.  Reports he had a fall a few days ago, since then has had some lightheadedness.  Today he felt lightheaded and fell hitting his head against the wall.  Reports he has been feeling hot and cold.  Reports chills, nausea.  Reports an area to his right shoulder with pain as well as boils on his buttocks.  Reports he thinks he has MRSA in his nose.    Past Medical History:  Diagnosis Date  . Beta-hemolytic group B streptococcal sepsis (HCC) 10/2019   at [facility]  . Chronic systolic CHF (congestive heart failure) (HCC) 12/30/2019  . Coronary artery disease involving native coronary artery of native heart without angina pectoris 12/30/2019  . Essential hypertension 12/30/2019  . Hypertension   . Intravenous drug abuse (HCC) 12/30/2019  . Ischemic cardiomyopathy 12/30/2019  . Mixed hyperlipidemia 12/30/2019  . Nicotine dependence, cigarettes, uncomplicated 12/30/2019    Patient Active Problem List   Diagnosis Date Noted  . History of intravenous drug abuse (HCC)   . Lung field abnormal finding on examination   . Coronary artery disease involving native coronary artery of native heart without angina pectoris 12/30/2019  . Essential hypertension 12/30/2019  . Ischemic cardiomyopathy 12/30/2019  . Mixed hyperlipidemia 12/30/2019  . Cellulitis of left upper extremity 12/30/2019  . Acute deep vein thrombosis (DVT) of left upper extremity (HCC) 12/30/2019  . Nicotine dependence, cigarettes, uncomplicated 12/30/2019  . Intravenous drug abuse  (HCC) 12/30/2019  . Chronic systolic CHF (congestive heart failure) (HCC) 12/30/2019    Past Surgical History:  Procedure Laterality Date  . I & D EXTREMITY Left 01/03/2020   Procedure: IRRIGATION AND DEBRIDEMENT LEFT UPPER EXTREMITY;  Surgeon: Sheral Apley, MD;  Location: [facility];  Service: Orthopedics;  Laterality: Left;       Family History  Family history unknown: Yes    Social History   Tobacco Use  . Smoking status: Current Every Day Smoker    Packs/day: 1.00    Types: Cigarettes  . Smokeless tobacco: Never Used  Substance Use Topics  . Alcohol use: No  . Drug use: Yes    Types: IV    Comment: opiates    Home Medications Prior to Admission medications   Medication Sig Start Date End Date Taking? Authorizing Provider  apixaban (ELIQUIS) 5 MG TABS tablet Take 1 tablet (5 mg total) by mouth 2 (two) times daily. 01/08/20   Rolly Salter, MD  aspirin EC 81 MG EC tablet Take 1 tablet (81 mg total) by mouth daily. Swallow whole. 01/08/20   Rolly Salter, MD  atorvastatin (LIPITOR) 40 MG tablet Take 1 tablet (40 mg total) by mouth daily at 6 PM. 01/08/20   Rolly Salter, MD  carvedilol (COREG) 3.125 MG tablet Take 1 tablet (3.125 mg total) by mouth 2 (two) times daily with a meal. 01/08/20   Rolly Salter, MD  cyclobenzaprine (FLEXERIL) 10 MG tablet Take 1 tablet (10 mg total) by mouth 3 (three) times daily. 01/08/20  Rolly Salter, MD  doxycycline (VIBRAMYCIN) 100 MG capsule Take 1 capsule (100 mg total) by mouth 2 (two) times daily for 7 days. 03/04/20 03/11/20  Alvira Monday, MD  famotidine (PEPCID) 20 MG tablet Take 1 tablet (20 mg total) by mouth daily for 6 days. 01/08/20 01/14/20  Rolly Salter, MD  FLUoxetine (PROZAC) 20 MG capsule Take 20 mg by mouth daily.    [provider]  gabapentin (NEURONTIN) 300 MG capsule Take 1 capsule (300 mg total) by mouth 3 (three) times daily for 20 days. 01/08/20 01/28/20  Rolly Salter, MD  levETIRAcetam (KEPPRA PO)  Take 1 tablet by mouth daily.    [provider]  lisinopril (ZESTRIL) 10 MG tablet Take 1 tablet (10 mg total) by mouth daily. 01/08/20   Rolly Salter, MD  nicotine (NICODERM CQ - DOSED IN MG/24 HOURS) 21 mg/24hr patch Place 1 patch (21 mg total) onto the skin daily. 01/08/20   Rolly Salter, MD  polyethylene glycol (MIRALAX / GLYCOLAX) 17 g packet Take 17 g by mouth daily. 01/08/20   Rolly Salter, MD    Allergies    Codeine and Penicillins  Review of Systems   Review of Systems  Constitutional: Positive for chills and fever.  HENT: Negative for sore throat.   Eyes: Negative for visual disturbance.  Respiratory: Negative for cough and shortness of breath.   Cardiovascular: Negative for chest pain.  Gastrointestinal: Positive for nausea. Negative for abdominal pain.  Genitourinary: Negative for difficulty urinating.  Musculoskeletal: Negative for back pain and neck stiffness.  Skin: Positive for rash and wound.  Neurological: Positive for syncope, light-headedness and headaches.    Physical Exam Updated Vital Signs BP 119/83 (BP Location: Left Arm)   Pulse 87   Temp 97.7 F (36.5 C) (Oral)   Resp 16   SpO2 100%   Physical Exam Vitals and nursing note reviewed.  Constitutional:      General: He is not in acute distress.    Appearance: He is well-developed. He is not diaphoretic.  HENT:     Head: Normocephalic.     Comments: Superficial scalp lacerations healing Eyes:     Conjunctiva/sclera: Conjunctivae normal.  Cardiovascular:     Rate and Rhythm: Normal rate and regular rhythm.     Heart sounds: Normal heart sounds. No murmur heard.  No friction rub. No gallop.   Pulmonary:     Effort: Pulmonary effort is normal. No respiratory distress.     Breath sounds: Normal breath sounds. No wheezing or rales.  Abdominal:     General: There is no distension.     Palpations: Abdomen is soft.     Tenderness: There is no abdominal tenderness. There is no guarding.   Musculoskeletal:     Cervical back: Normal range of motion.  Skin:    General: Skin is warm and dry.     Comments: Small papules over buttock, one excoriated/healing without signs surrounding erythema, no fluctuance.  1cm lesion to right shoulder with erythema/induration, no significant fluctuance  Neurological:     Mental Status: He is alert and oriented to person, place, and time.     ED Results / Procedures / Treatments   Labs (all labs ordered are listed, but only abnormal results are displayed) Labs Reviewed  CBC WITH DIFFERENTIAL/PLATELET - Abnormal; Notable for the following components:      Result Value   Hemoglobin 12.3 (*)    All other components within normal limits  COMPREHENSIVE METABOLIC PANEL - Abnormal; Notable for the following components:   AST 44 (*)    ALT 59 (*)    Total Bilirubin 0.2 (*)    All other components within normal limits  CULTURE, BLOOD (ROUTINE X 2)  CULTURE, BLOOD (ROUTINE X 2)    EKG None  Radiology CT Head Wo Contrast  Result Date: 03/03/2020 CLINICAL DATA:  Status post trauma. EXAM: CT HEAD WITHOUT CONTRAST TECHNIQUE: Contiguous axial images were obtained from the base of the skull through the vertex without intravenous contrast. COMPARISON:  None. FINDINGS: Brain: No evidence of acute infarction, hemorrhage, hydrocephalus, extra-axial collection or mass lesion/mass effect. Vascular: No hyperdense vessel or unexpected calcification. Skull: Normal. Negative for fracture or focal lesion. Sinuses/Orbits: No acute finding. Other: Very mild left parietal scalp soft tissue swelling is seen. IMPRESSION: 1. No acute intracranial abnormality. 2. Very mild left parietal scalp soft tissue swelling. Electronically Signed   By: Aram Candela M.D.   On: 03/03/2020 21:33    Procedures Procedures (including critical care time)  Medications Ordered in ED Medications  ketorolac (TORADOL) 30 MG/ML injection 30 mg (30 mg Intravenous Given 03/03/20 2259)   prochlorperazine (COMPAZINE) injection 10 mg (10 mg Intravenous Given 03/03/20 2302)  diphenhydrAMINE (BENADRYL) injection 25 mg (25 mg Intravenous Given 03/03/20 2302)  acetaminophen (TYLENOL) tablet 1,000 mg (1,000 mg Oral Given 03/04/20 0048)    ED Course  I have reviewed the triage vital signs and the nursing notes.  Pertinent labs & imaging results that were available during my care of the patient were reviewed by me and considered in my medical decision making (see chart for details).    MDM Rules/Calculators/A&P                          34 year old male with history of IV drug abuse, ischemic cardiomyopathy EF 25%, DVT on Eliquis, recent admission for left upper extremity cellulitis and sepsis presents from jail with concern for dizziness and fall now with headache.    CT head done given patient on anticoagulation shows no evidence of intracranial bleed.  No sign of arrhythmias on EKG.  Labs show no leukocytosis, no significant anemia or electrolyte abnormalities. Given report of subjective fever and chills and history of IV drug use, blood cultures were repeated.    He has a area on his buttock consistent with folliculitis, without signs of abscess, as well as a small area of induration to the right shoulder without fluctuance.  Will treat with doxycycline. Patient discharged in stable condition with understanding of reasons to return.    Final Clinical Impression(s) / ED Diagnoses Final diagnoses:  Injury of head, initial encounter  Folliculitis  Boil of buttock  Lightheadedness    Rx / DC Orders ED Discharge Orders         Ordered    doxycycline (VIBRAMYCIN) 100 MG capsule  2 times daily     Discontinue  Reprint     03/04/20 0003           Alvira Monday, MD 03/05/20 [phone]

## 2020-03-08 LAB — CULTURE, BLOOD (ROUTINE X 2)
Culture: NO GROWTH
Special Requests: ADEQUATE

## 2021-06-22 IMAGING — DX DG CHEST 1V
2 series · 2 of 2 positions shown · non-contrast
Comparison: 11/15/2018

CLINICAL DATA: History of IVDA with pain and swelling in the left
upper extremity

EXAM:
CHEST  1 VIEW

[chest ap (1 of 2)]
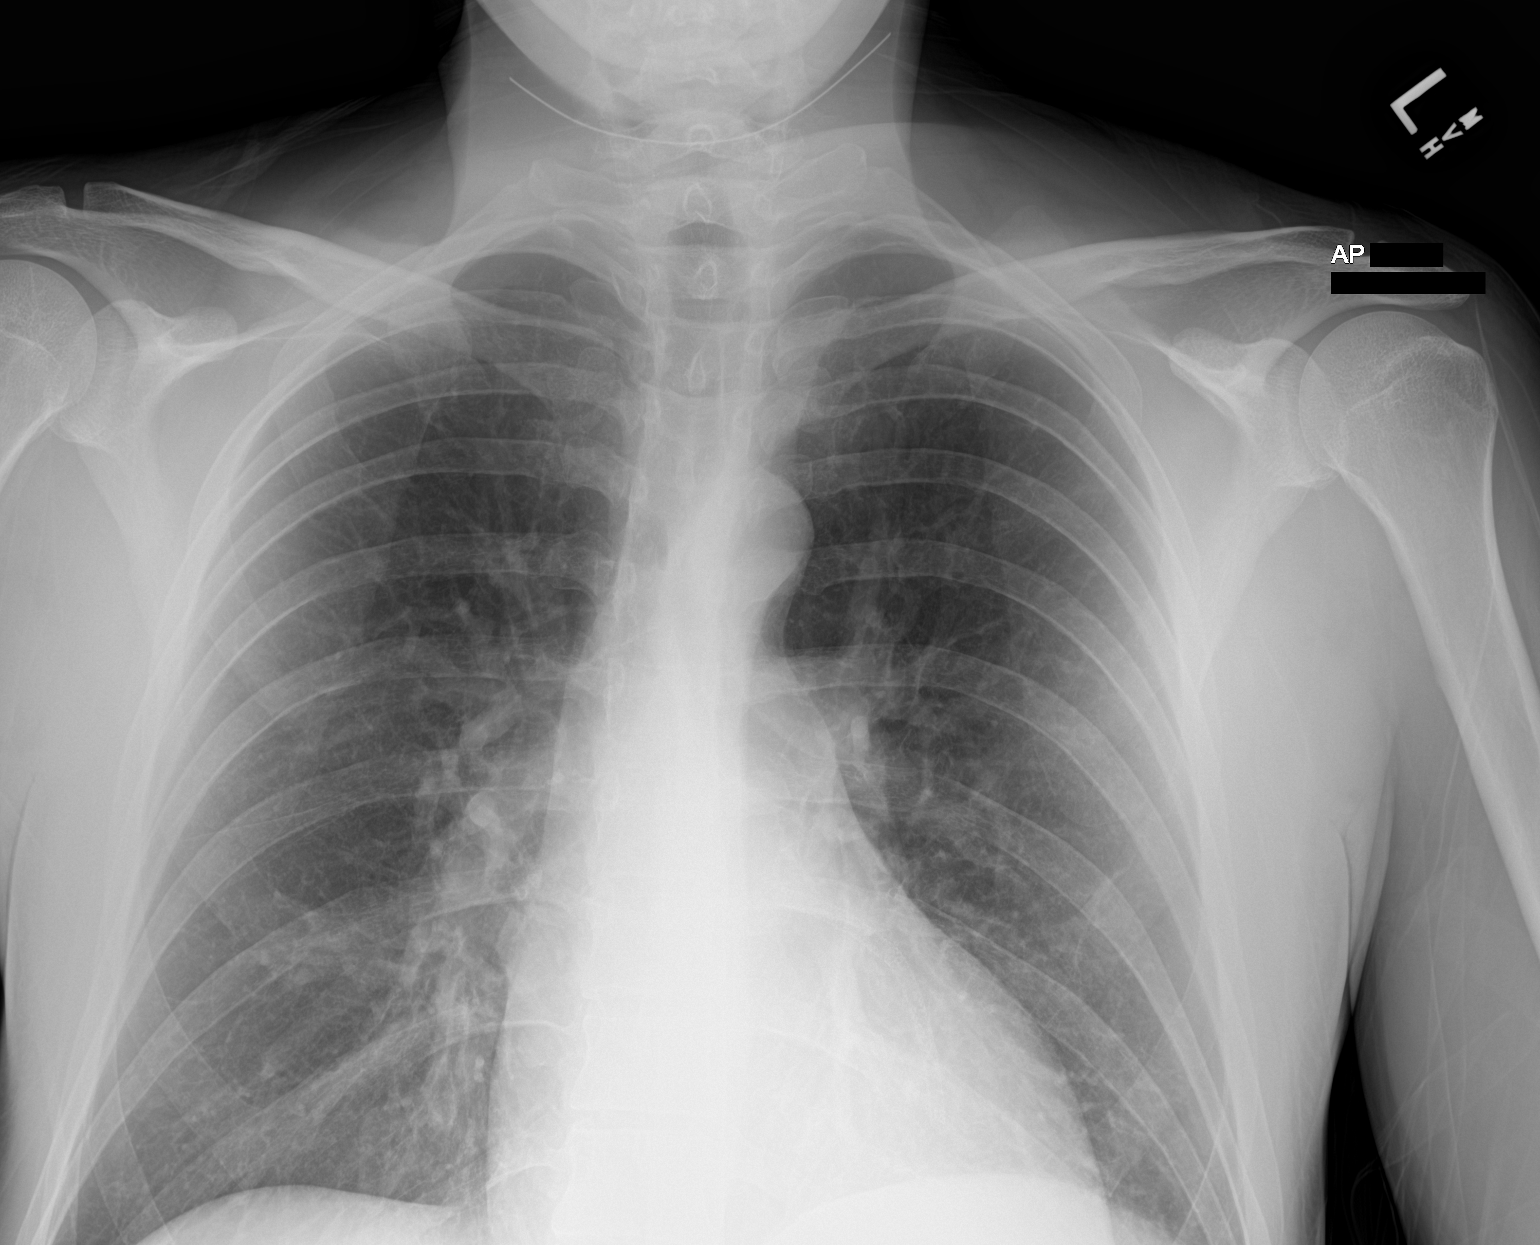

[chest ap (2 of 2)]
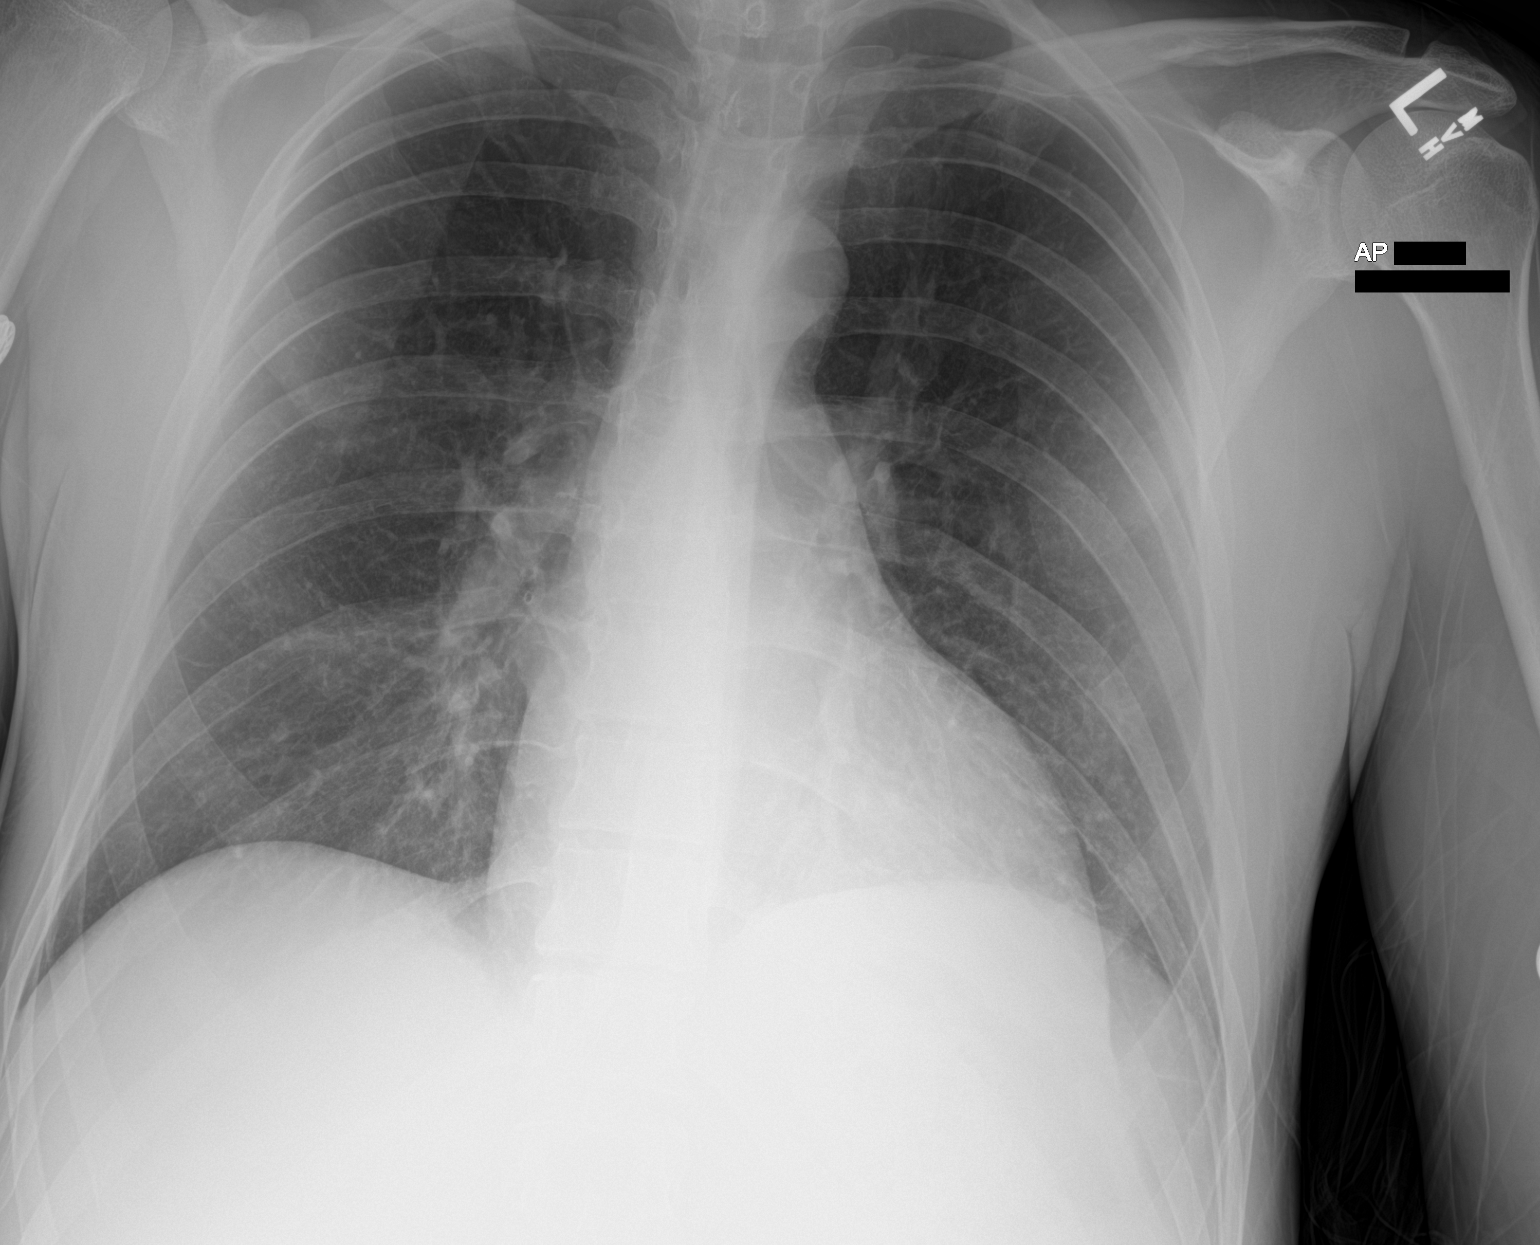

[2 of 2 positions shown; findings below may reference images not displayed]

FINDINGS: Cardiac shadow is stable. Coronary stenting is noted. Lungs are well
aerated bilaterally. No focal infiltrate or sizable effusion is
seen. No bony abnormality is noted.
IMPRESSION: No acute abnormality seen.

## 2021-06-24 IMAGING — CT CT FOREARM*L* W/CM
2 series · 12 of 20 positions shown, 15 images · IV contrast (omnipaque)
Comparison: Left forearm x-ray dated December 30, 2019.

CLINICAL DATA: Left forearm and hand swelling. History of IV drug
use.

EXAM:
CT OF THE UPPER LEFT EXTREMITY WITH CONTRAST
TECHNIQUE: Multidetector CT imaging of the left forearm and hand was performed
according to the standard protocol following intravenous contrast
administration.
CONTRAST:  100mL OMNIPAQUE IOHEXOL 300 MG/ML  SOLN

[Series 6: ax bone · axial · 0.39mm/px · z∈[-468,-89]mm · 9 of 236 slices shown, 12 images]
[im 19/236  soft-tissue]
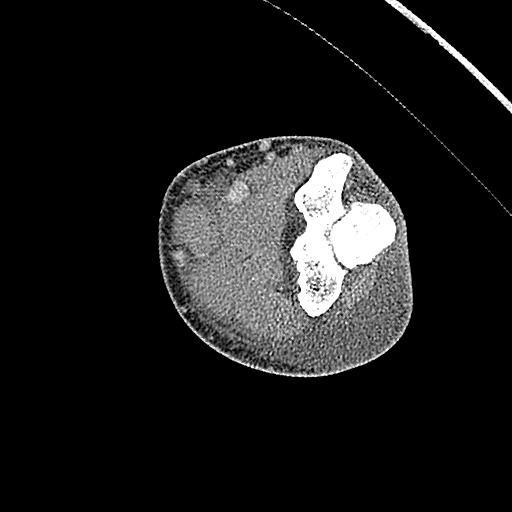
[im 19/236  bone]
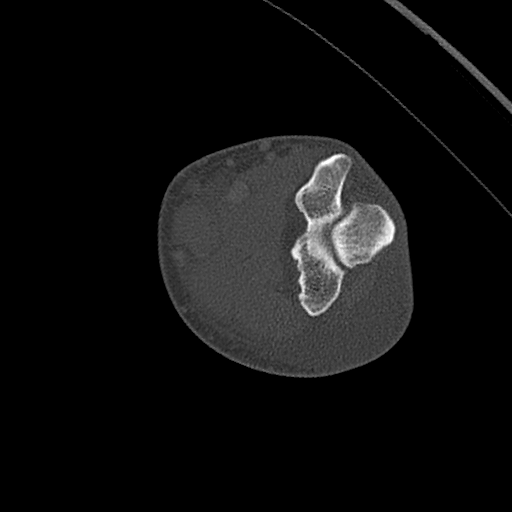
[im 55/236  bone]
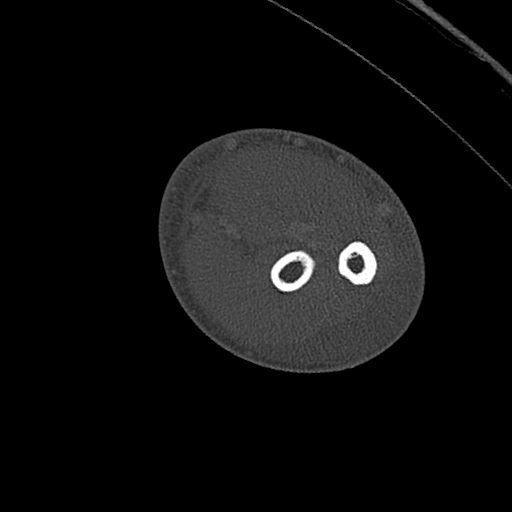
[im 73/236  bone]
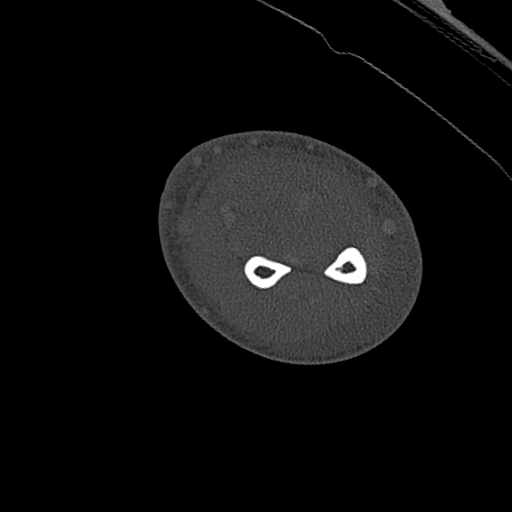
[im 91/236  bone]
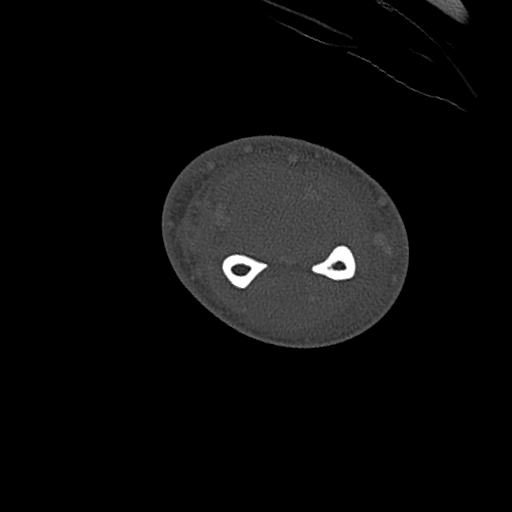
[im 127/236  soft-tissue]
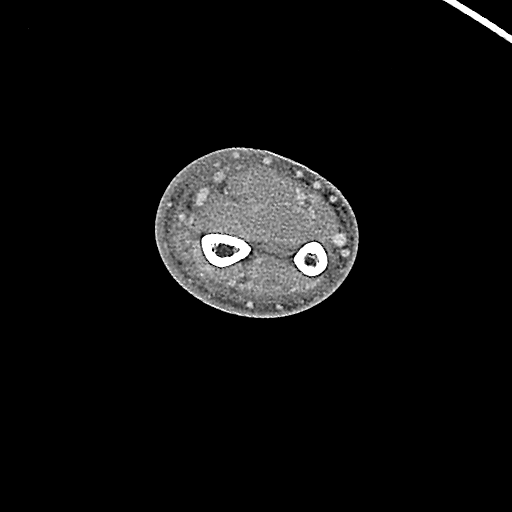
[im 127/236  bone]
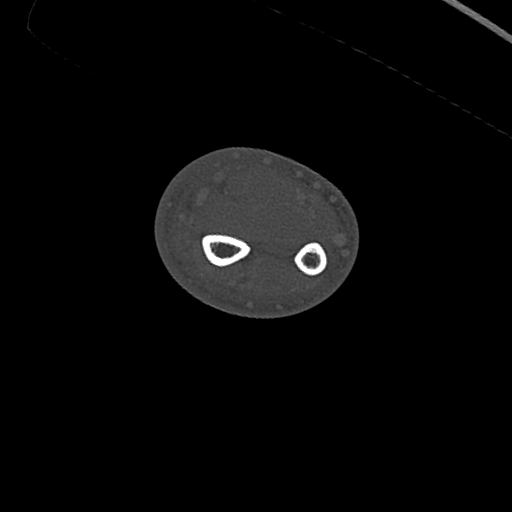
[im 145/236  bone]
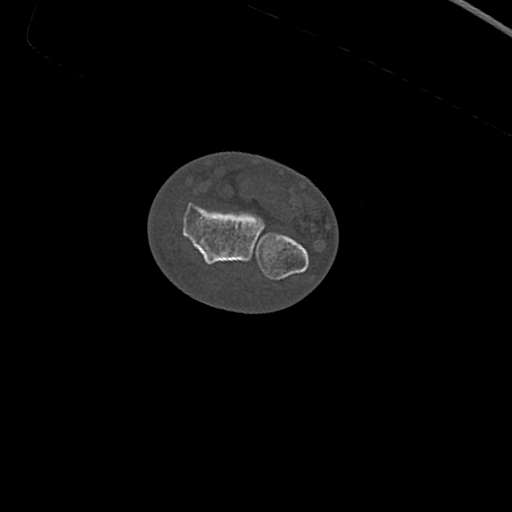
[im 163/236  bone]
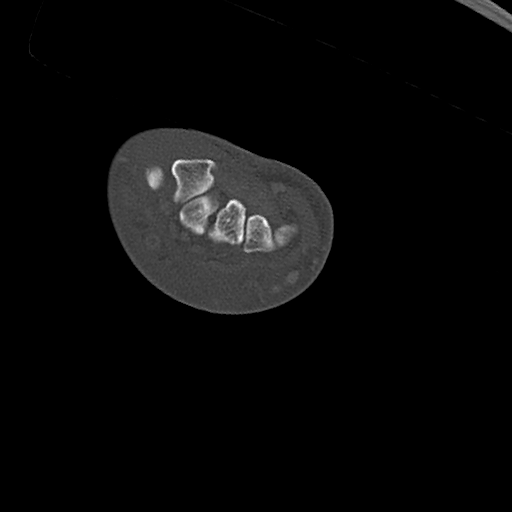
[im 199/236  bone]
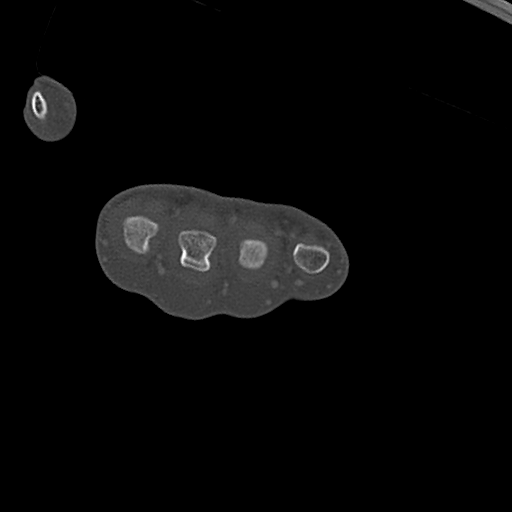
[im 217/236  soft-tissue]
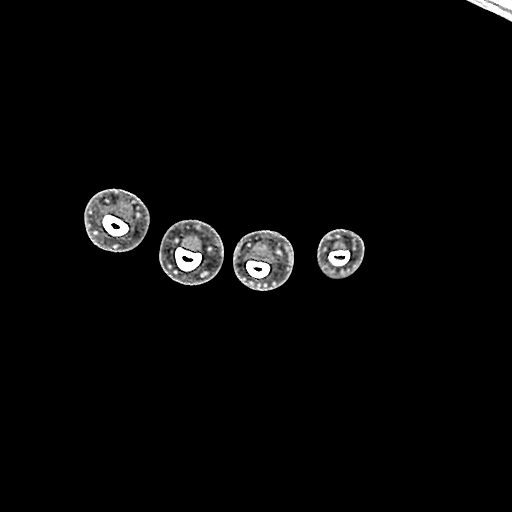
[im 217/236  bone]
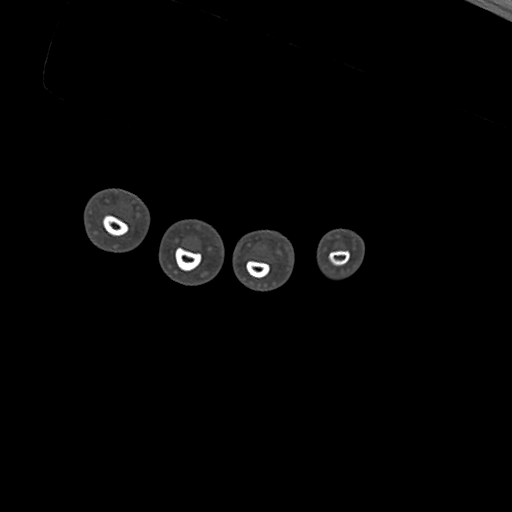

[Series 10: cor st · coronal · 0.39mm/px · 3 of 101 slices shown]
[im 21/101  bone]
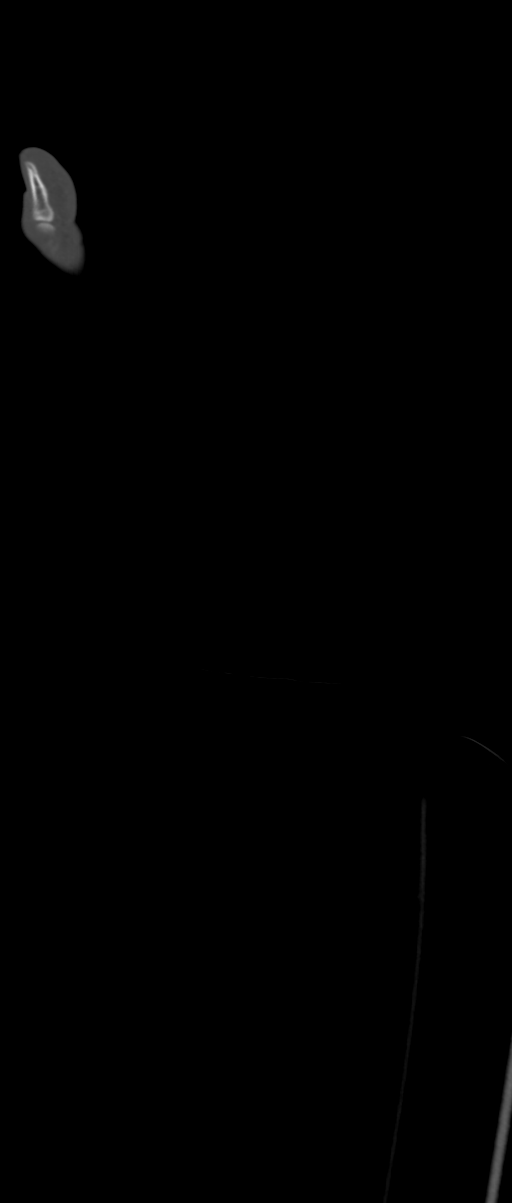
[im 41/101  bone]
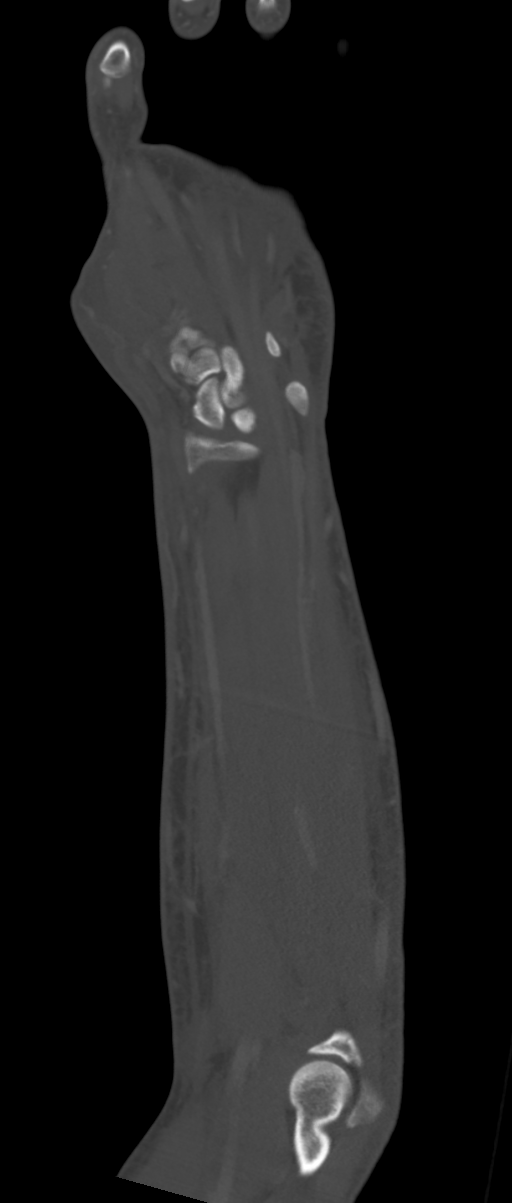
[im 61/101  bone]
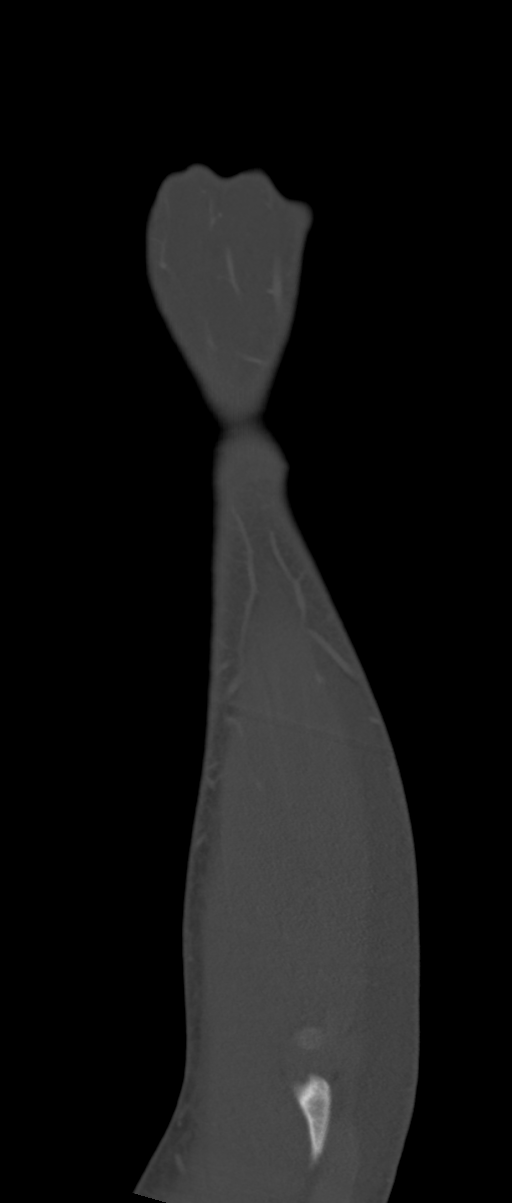

[12 of 20 positions shown; findings below may reference images not displayed]

FINDINGS: Bones/Joint/Cartilage

No bony destruction or periosteal reaction. No fracture or
dislocation. Joint spaces are preserved. No joint effusion. Bone
mineralization is normal.

Ligaments

Ligaments are suboptimally evaluated by CT.

Muscles and Tendons
Grossly intact.  No definite muscle edema.  No muscle atrophy.

Soft tissue
Acute occlusive superficial thrombosis of the cephalic vein
beginning in the mid forearm and extending to the wrist (series 10,
image 48). Extension of acute occlusive thrombus within superficial
wrist radial and dorsal branch veins arising from the cephalic vein
(series 10, image 43). The basilic and deep veins are patent.

There is a curvilinear 1.2 x 0.7 x 1.7 cm rim enhancing fluid
collection adjacent to the occluded cephalic vein in the mid to
distal forearm (series 9, image 129). No additional fluid
collection.

Diffuse soft tissue swelling of the forearm extending into the
dorsal and radial aspect of the hand. No subcutaneous emphysema.
IMPRESSION: 1. Acute occlusive superficial thrombosis of the cephalic vein
beginning in the mid forearm and extending to the wrist. Extension
of acute occlusive thrombus within superficial wrist radial and
dorsal branch veins arising from the cephalic vein. Remaining veins
are patent.
2. 1.7 cm curvilinear rim enhancing fluid collection adjacent to the
occluded cephalic vein in the mid to distal forearm, concerning for
small abscess.
3. Diffuse soft tissue swelling of the forearm extending into the
dorsal and radial aspect of the hand. No evidence of deeper
infection.
4. No acute osseous abnormality.

## 2021-08-25 IMAGING — CT CT HEAD W/O CM
4 series · 15 of 47 positions shown, 17 images · non-contrast
Comparison: None.

CLINICAL DATA: Status post trauma.

EXAM:
CT HEAD WITHOUT CONTRAST
TECHNIQUE: Contiguous axial images were obtained from the base of the skull
through the vertex without intravenous contrast.

[Series 3: head without · axial · non-contrast · 0.45mm/px · z∈[+1469,+1599]mm · 7 of 36 slices shown, 9 images]
[im 5/36  brain]
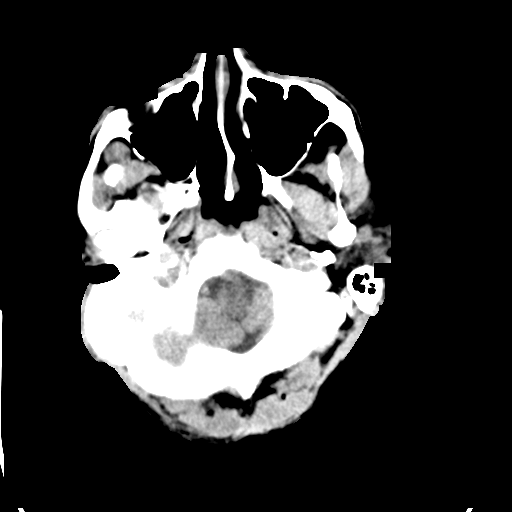
[im 5/36  bone]
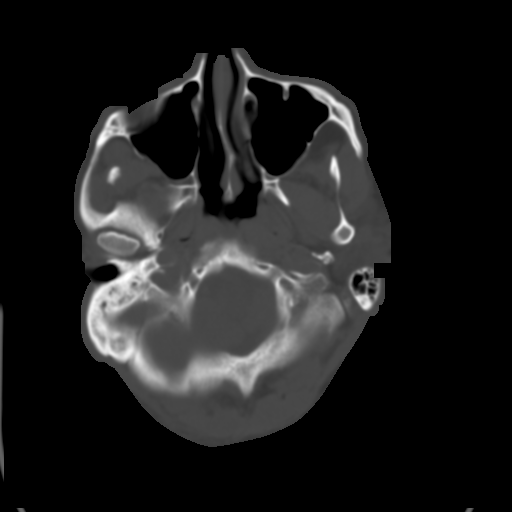
[im 9/36  brain]
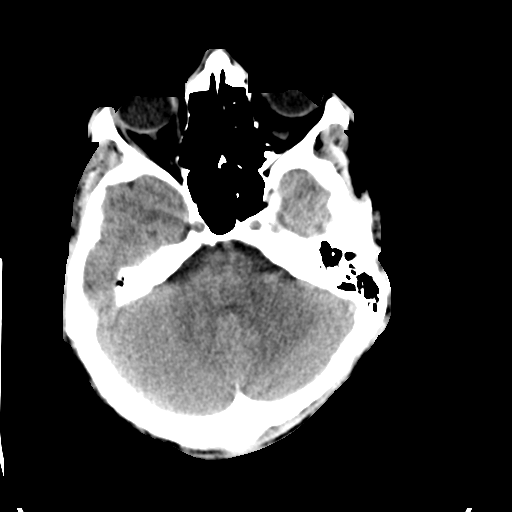
[im 14/36  brain]
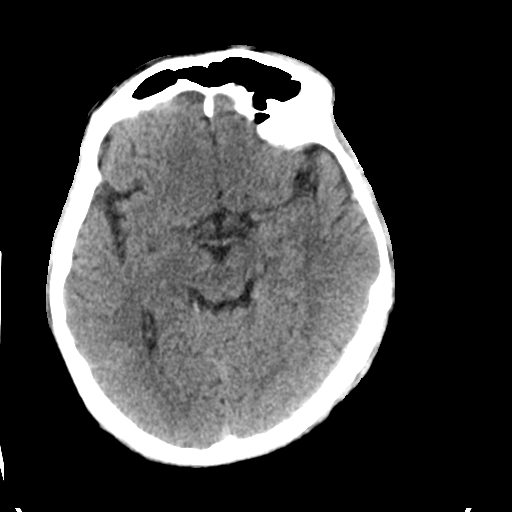
[im 18/36  brain]
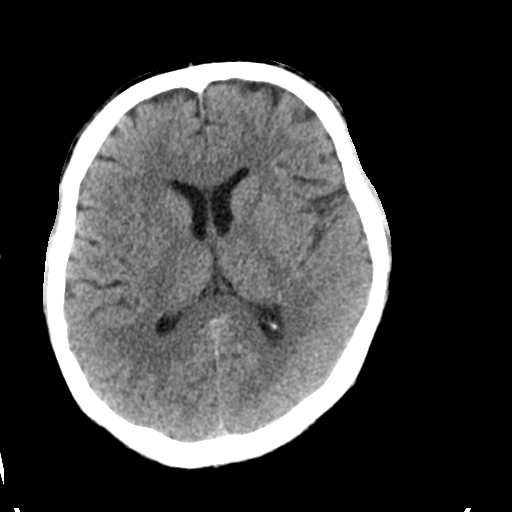
[im 22/36  brain]
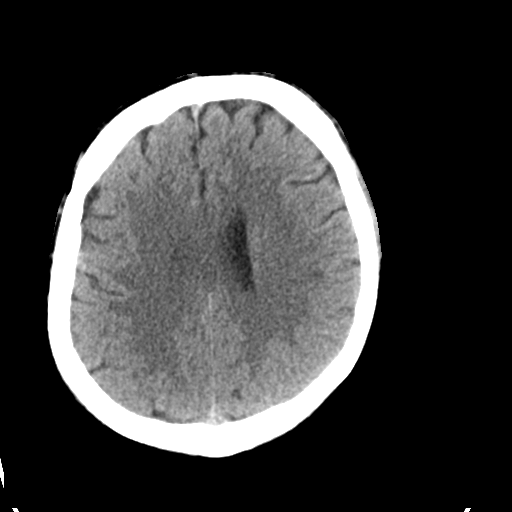
[im 22/36  bone]
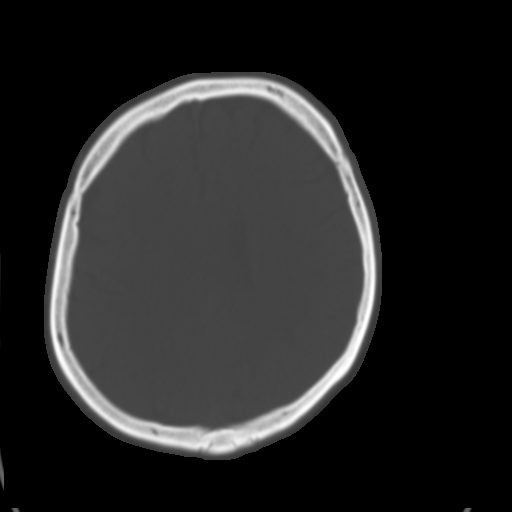
[im 27/36  brain]
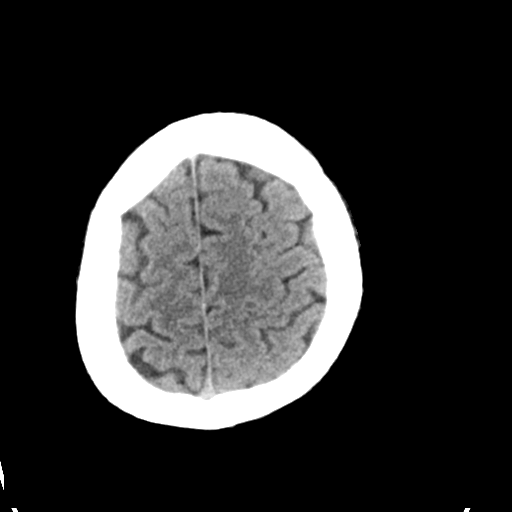
[im 31/36  brain]
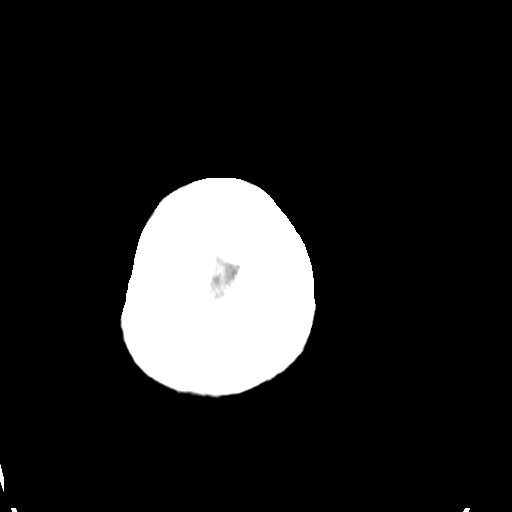

[Series 4: head bone · axial · 0.45mm/px · z∈[+1465,+1483]mm · 2 of 89 slices shown]
[im 9/89  bone]
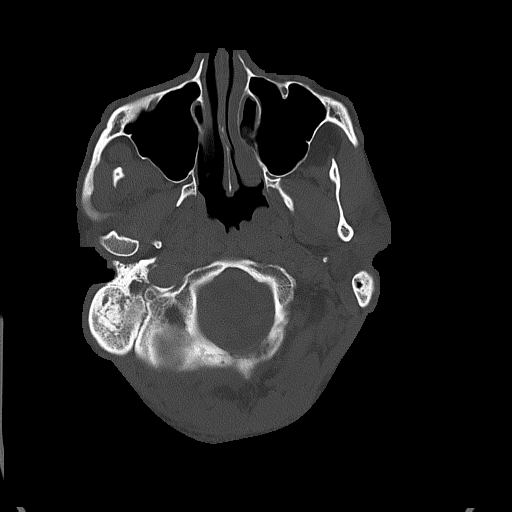
[im 18/89  bone]
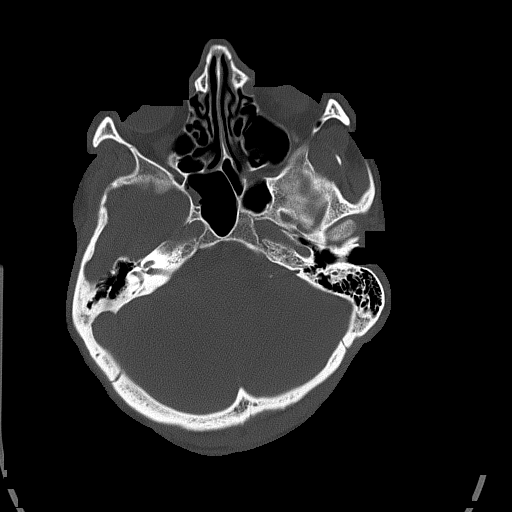

[Series 5: head without cor · coronal · non-contrast · 0.35mm/px · 3 of 73 slices shown]
[im 25/73  brain]
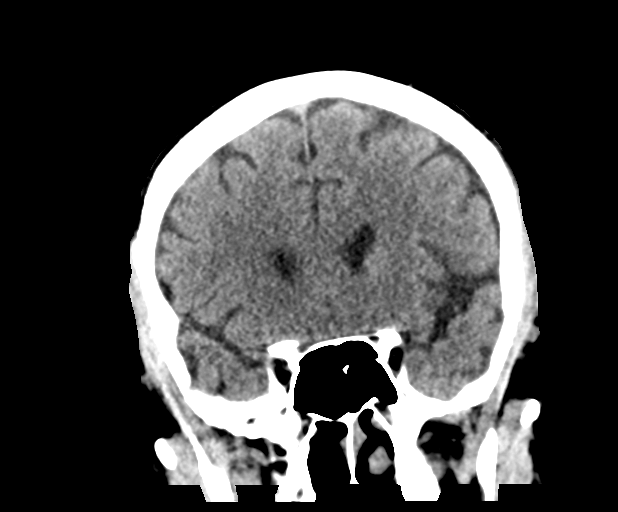
[im 33/73  brain]
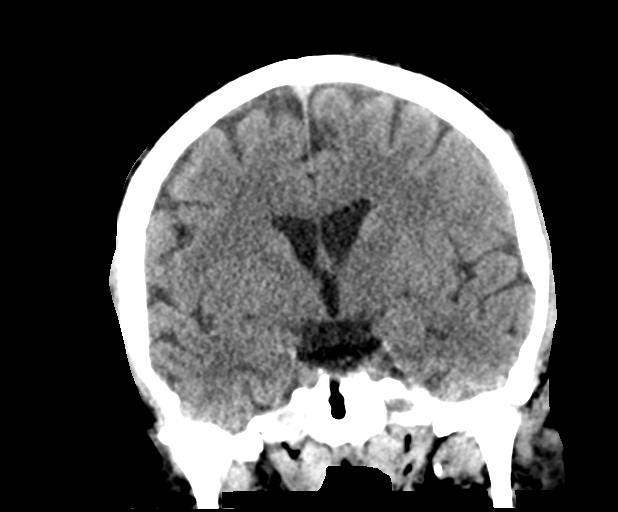
[im 41/73  brain]
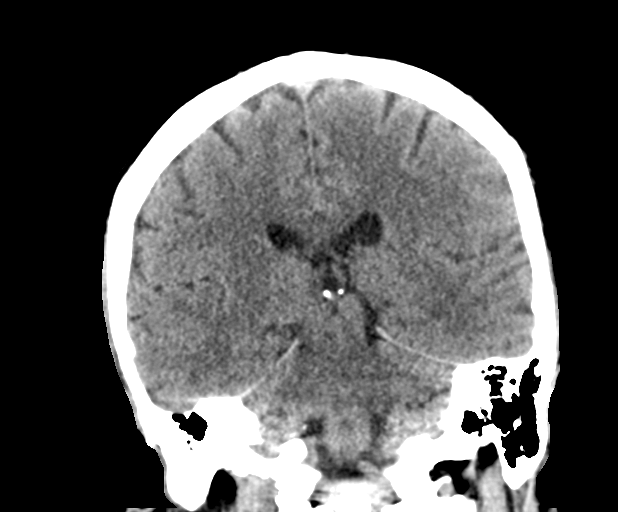

[Series 6: head without sag · sagittal · non-contrast · 0.35mm/px · 3 of 65 slices shown]
[im 22/65  brain]
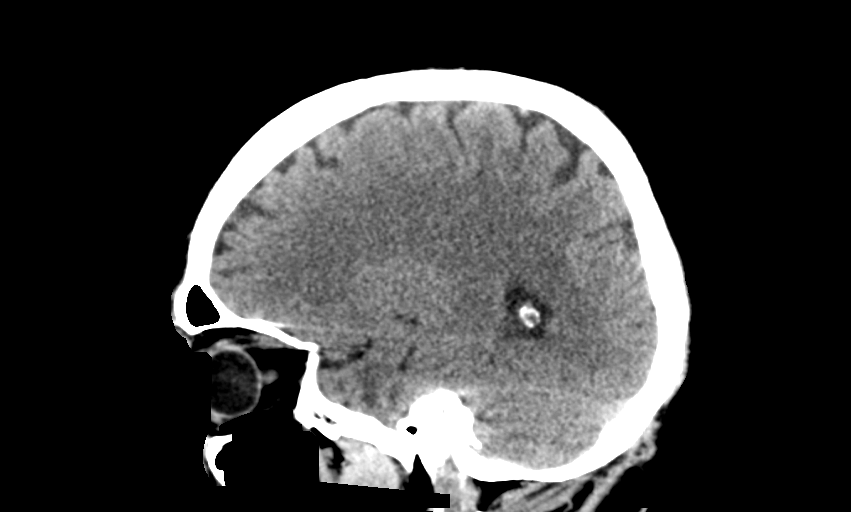
[im 33/65  brain]
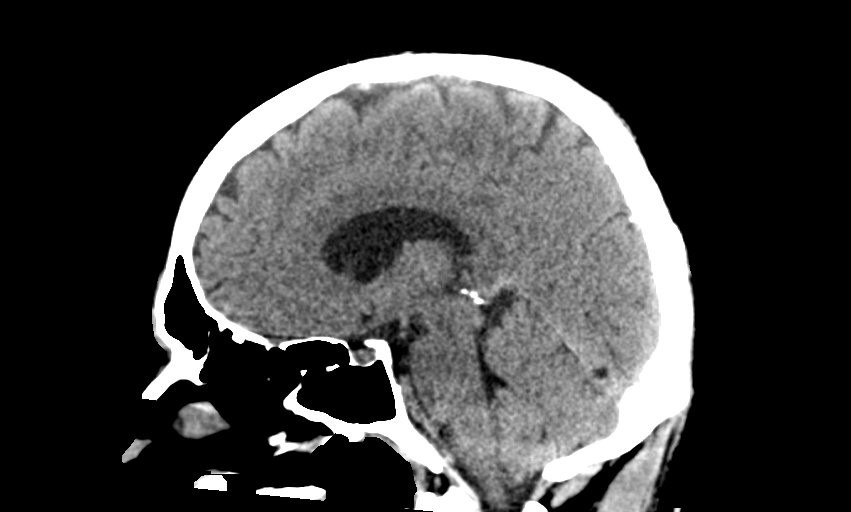
[im 43/65  brain]
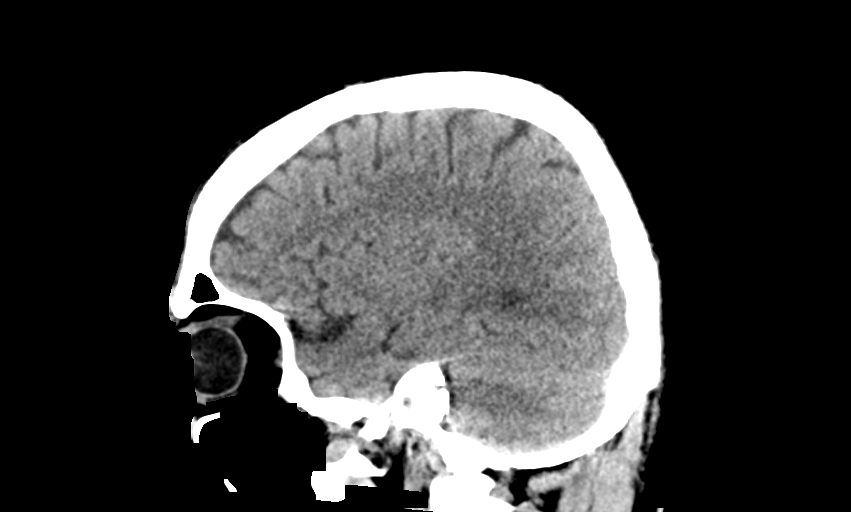

[15 of 47 positions shown; findings below may reference images not displayed]

FINDINGS: Brain: No evidence of acute infarction, hemorrhage, hydrocephalus,
extra-axial collection or mass lesion/mass effect.

Vascular: No hyperdense vessel or unexpected calcification.

Skull: Normal. Negative for fracture or focal lesion.

Sinuses/Orbits: No acute finding.

Other: Very mild left parietal scalp soft tissue swelling is seen.
IMPRESSION: 1. No acute intracranial abnormality.
2. Very mild left parietal scalp soft tissue swelling.

## 2022-07-25 DIAGNOSIS — K5090 Crohn's disease, unspecified, without complications: Secondary | ICD-10-CM | POA: Diagnosis present

## 2023-04-22 DIAGNOSIS — I472 Ventricular tachycardia, unspecified: Secondary | ICD-10-CM

## 2023-04-22 HISTORY — DX: Ventricular tachycardia, unspecified: I47.20

## 2023-05-07 DIAGNOSIS — Z95810 Presence of automatic (implantable) cardiac defibrillator: Secondary | ICD-10-CM | POA: Diagnosis present

## 2023-05-07 DIAGNOSIS — J9620 Acute and chronic respiratory failure, unspecified whether with hypoxia or hypercapnia: Secondary | ICD-10-CM | POA: Insufficient documentation

## 2023-05-11 DIAGNOSIS — Z87898 Personal history of other specified conditions: Secondary | ICD-10-CM | POA: Insufficient documentation

## 2023-05-18 DIAGNOSIS — Z8679 Personal history of other diseases of the circulatory system: Secondary | ICD-10-CM

## 2023-05-19 DIAGNOSIS — I82622 Acute embolism and thrombosis of deep veins of left upper extremity: Secondary | ICD-10-CM

## 2023-05-19 DIAGNOSIS — I2699 Other pulmonary embolism without acute cor pulmonale: Secondary | ICD-10-CM | POA: Insufficient documentation

## 2023-05-19 HISTORY — DX: Acute embolism and thrombosis of deep veins of left upper extremity: I82.622

## 2023-05-19 HISTORY — DX: Other pulmonary embolism without acute cor pulmonale: I26.99

## 2023-10-07 ENCOUNTER — Encounter (HOSPITAL_COMMUNITY): Payer: Self-pay

## 2023-10-07 ENCOUNTER — Emergency Department (HOSPITAL_COMMUNITY): Payer: Self-pay

## 2023-10-07 ENCOUNTER — Other Ambulatory Visit: Payer: Self-pay

## 2023-10-07 ENCOUNTER — Inpatient Hospital Stay (HOSPITAL_COMMUNITY)
Admission: EM | Admit: 2023-10-07 | Discharge: 2023-10-12 | DRG: 682 | Disposition: A | Discharge status: Home / Self Care | Payer: Self-pay | Attending: Internal Medicine | Admitting: Internal Medicine

## 2023-10-07 DIAGNOSIS — Z7901 Long term (current) use of anticoagulants: Secondary | ICD-10-CM

## 2023-10-07 DIAGNOSIS — N179 Acute kidney failure, unspecified: Principal | ICD-10-CM | POA: Diagnosis present

## 2023-10-07 DIAGNOSIS — Z955 Presence of coronary angioplasty implant and graft: Secondary | ICD-10-CM

## 2023-10-07 DIAGNOSIS — K50911 Crohn's disease, unspecified, with rectal bleeding: Secondary | ICD-10-CM | POA: Diagnosis present

## 2023-10-07 DIAGNOSIS — Z8674 Personal history of sudden cardiac arrest: Secondary | ICD-10-CM

## 2023-10-07 DIAGNOSIS — I255 Ischemic cardiomyopathy: Secondary | ICD-10-CM | POA: Diagnosis present

## 2023-10-07 DIAGNOSIS — Z880 Allergy status to penicillin: Secondary | ICD-10-CM

## 2023-10-07 DIAGNOSIS — Z86718 Personal history of other venous thrombosis and embolism: Secondary | ICD-10-CM

## 2023-10-07 DIAGNOSIS — L309 Dermatitis, unspecified: Secondary | ICD-10-CM | POA: Diagnosis present

## 2023-10-07 DIAGNOSIS — I130 Hypertensive heart and chronic kidney disease with heart failure and stage 1 through stage 4 chronic kidney disease, or unspecified chronic kidney disease: Secondary | ICD-10-CM | POA: Diagnosis present

## 2023-10-07 DIAGNOSIS — D509 Iron deficiency anemia, unspecified: Secondary | ICD-10-CM | POA: Diagnosis present

## 2023-10-07 DIAGNOSIS — B182 Chronic viral hepatitis C: Secondary | ICD-10-CM | POA: Diagnosis present

## 2023-10-07 DIAGNOSIS — I25118 Atherosclerotic heart disease of native coronary artery with other forms of angina pectoris: Secondary | ICD-10-CM | POA: Diagnosis present

## 2023-10-07 DIAGNOSIS — I214 Non-ST elevation (NSTEMI) myocardial infarction: Secondary | ICD-10-CM

## 2023-10-07 DIAGNOSIS — Z86711 Personal history of pulmonary embolism: Secondary | ICD-10-CM

## 2023-10-07 DIAGNOSIS — I252 Old myocardial infarction: Secondary | ICD-10-CM

## 2023-10-07 DIAGNOSIS — Z79899 Other long term (current) drug therapy: Secondary | ICD-10-CM

## 2023-10-07 DIAGNOSIS — Z8619 Personal history of other infectious and parasitic diseases: Secondary | ICD-10-CM

## 2023-10-07 DIAGNOSIS — D631 Anemia in chronic kidney disease: Secondary | ICD-10-CM | POA: Diagnosis present

## 2023-10-07 DIAGNOSIS — Z885 Allergy status to narcotic agent status: Secondary | ICD-10-CM

## 2023-10-07 DIAGNOSIS — E876 Hypokalemia: Secondary | ICD-10-CM | POA: Diagnosis present

## 2023-10-07 DIAGNOSIS — I5023 Acute on chronic systolic (congestive) heart failure: Secondary | ICD-10-CM | POA: Diagnosis not present

## 2023-10-07 DIAGNOSIS — F17210 Nicotine dependence, cigarettes, uncomplicated: Secondary | ICD-10-CM | POA: Diagnosis present

## 2023-10-07 DIAGNOSIS — I428 Other cardiomyopathies: Secondary | ICD-10-CM | POA: Diagnosis present

## 2023-10-07 DIAGNOSIS — Z95810 Presence of automatic (implantable) cardiac defibrillator: Secondary | ICD-10-CM

## 2023-10-07 DIAGNOSIS — Z5986 Financial insecurity: Secondary | ICD-10-CM

## 2023-10-07 DIAGNOSIS — F1910 Other psychoactive substance abuse, uncomplicated: Secondary | ICD-10-CM | POA: Diagnosis present

## 2023-10-07 DIAGNOSIS — E782 Mixed hyperlipidemia: Secondary | ICD-10-CM | POA: Diagnosis present

## 2023-10-07 DIAGNOSIS — I513 Intracardiac thrombosis, not elsewhere classified: Secondary | ICD-10-CM | POA: Diagnosis present

## 2023-10-07 DIAGNOSIS — Z7984 Long term (current) use of oral hypoglycemic drugs: Secondary | ICD-10-CM

## 2023-10-07 DIAGNOSIS — R079 Chest pain, unspecified: Secondary | ICD-10-CM | POA: Diagnosis present

## 2023-10-07 DIAGNOSIS — Z7982 Long term (current) use of aspirin: Secondary | ICD-10-CM

## 2023-10-07 DIAGNOSIS — N1831 Chronic kidney disease, stage 3a: Secondary | ICD-10-CM | POA: Diagnosis present

## 2023-10-07 LAB — I-STAT CHEM 8, ED
BUN: 36 mg/dL — ABNORMAL HIGH (ref 6–20)
Calcium, Ion: 1.02 mmol/L — ABNORMAL LOW (ref 1.15–1.40)
Chloride: 106 mmol/L (ref 98–111)
Creatinine, Ser: 2.1 mg/dL — ABNORMAL HIGH (ref 0.61–1.24)
Glucose, Bld: 119 mg/dL — ABNORMAL HIGH (ref 70–99)
HCT: 30 % — ABNORMAL LOW (ref 39.0–52.0)
Hemoglobin: 10.2 g/dL — ABNORMAL LOW (ref 13.0–17.0)
Potassium: 4.5 mmol/L (ref 3.5–5.1)
Sodium: 140 mmol/L (ref 135–145)
TCO2: 21 mmol/L — ABNORMAL LOW (ref 22–32)

## 2023-10-07 LAB — RESP PANEL BY RT-PCR (RSV, FLU A&B, COVID)  RVPGX2
Influenza A by PCR: NEGATIVE
Influenza B by PCR: NEGATIVE
Resp Syncytial Virus by PCR: NEGATIVE
SARS Coronavirus 2 by RT PCR: NEGATIVE

## 2023-10-07 LAB — HEPATIC FUNCTION PANEL
ALT: 14 U/L (ref 0–44)
AST: 26 U/L (ref 15–41)
Albumin: 3.3 g/dL — ABNORMAL LOW (ref 3.5–5.0)
Alkaline Phosphatase: 72 U/L (ref 38–126)
Bilirubin, Direct: 0.4 mg/dL — ABNORMAL HIGH (ref 0.0–0.2)
Indirect Bilirubin: 1.1 mg/dL — ABNORMAL HIGH (ref 0.3–0.9)
Total Bilirubin: 1.5 mg/dL — ABNORMAL HIGH (ref 0.0–1.2)
Total Protein: 7.6 g/dL (ref 6.5–8.1)

## 2023-10-07 LAB — CBC
HCT: 30.9 % — ABNORMAL LOW (ref 39.0–52.0)
Hemoglobin: 8.9 g/dL — ABNORMAL LOW (ref 13.0–17.0)
MCH: 19.8 pg — ABNORMAL LOW (ref 26.0–34.0)
MCHC: 28.8 g/dL — ABNORMAL LOW (ref 30.0–36.0)
MCV: 68.7 fL — ABNORMAL LOW (ref 80.0–100.0)
Platelets: 399 10*3/uL (ref 150–400)
RBC: 4.5 MIL/uL (ref 4.22–5.81)
RDW: 18.6 % — ABNORMAL HIGH (ref 11.5–15.5)
WBC: 6.7 10*3/uL (ref 4.0–10.5)
nRBC: 0 % (ref 0.0–0.2)

## 2023-10-07 LAB — BASIC METABOLIC PANEL
Anion gap: 11 (ref 5–15)
BUN: 39 mg/dL — ABNORMAL HIGH (ref 6–20)
CO2: 21 mmol/L — ABNORMAL LOW (ref 22–32)
Calcium: 8.5 mg/dL — ABNORMAL LOW (ref 8.9–10.3)
Chloride: 103 mmol/L (ref 98–111)
Creatinine, Ser: 1.94 mg/dL — ABNORMAL HIGH (ref 0.61–1.24)
GFR, Estimated: 45 mL/min — ABNORMAL LOW (ref >60)
Glucose, Bld: 124 mg/dL — ABNORMAL HIGH (ref 70–99)
Potassium: 4.4 mmol/L (ref 3.5–5.1)
Sodium: 135 mmol/L (ref 135–145)

## 2023-10-07 LAB — POC OCCULT BLOOD, ED: Fecal Occult Bld: NEGATIVE

## 2023-10-07 LAB — TROPONIN I (HIGH SENSITIVITY)
Troponin I (High Sensitivity): 18 ng/L — ABNORMAL HIGH (ref <18)
Troponin I (High Sensitivity): 16 ng/L (ref <18)

## 2023-10-07 LAB — BRAIN NATRIURETIC PEPTIDE: B Natriuretic Peptide: 1830.7 pg/mL — ABNORMAL HIGH (ref 0.0–100.0)

## 2023-10-07 MED ORDER — ONDANSETRON HCL 4 MG/2ML IJ SOLN
4.0000 mg | Freq: Once | INTRAMUSCULAR | Status: AC
  Administered 2023-10-07: 4 mg via INTRAVENOUS
  Filled 2023-10-07: qty 2

## 2023-10-07 MED ORDER — SODIUM CHLORIDE 0.9 % IV BOLUS
500.0000 mL | Freq: Once | INTRAVENOUS | Status: AC
Start: 2023-10-07 — End: 2023-10-07
  Administered 2023-10-07: 500 mL via INTRAVENOUS

## 2023-10-07 MED ORDER — FENTANYL CITRATE PF 50 MCG/ML IJ SOSY
50.0000 ug | PREFILLED_SYRINGE | Freq: Once | INTRAMUSCULAR | Status: AC
  Administered 2023-10-07: 50 ug via INTRAVENOUS
  Filled 2023-10-07: qty 1

## 2023-10-07 MED ORDER — ENOXAPARIN SODIUM 40 MG/0.4ML IJ SOSY
40.0000 mg | PREFILLED_SYRINGE | INTRAMUSCULAR | Status: DC
  Filled 2023-10-07: qty 0.4

## 2023-10-07 MED ORDER — IOHEXOL 350 MG/ML SOLN
65.0000 mL | Freq: Once | INTRAVENOUS | Status: AC | PRN
  Administered 2023-10-07: 65 mL via INTRAVENOUS

## 2023-10-07 NOTE — ED Notes (Signed)
 Assumed pt care from [facility]

## 2023-10-07 NOTE — ED Notes (Signed)
 I tried to find a vein to obtain an IV on pt, but was unsuccessful

## 2023-10-07 NOTE — ED Notes (Signed)
 Patient transported to CT

## 2023-10-07 NOTE — ED Triage Notes (Signed)
 Pt bib [facility] with c/o chest pain, shortness of breath, n/v. Pt reports symptoms have been ongoing for days but have gotten increasingly worse today. Pt states he feels like he's defibrillator may have gone off last night. Pt states "the last time I had pain like this I had a blood clot in my lung." Pt alert and oriented x4. GCS 15.  EMS vs: EMS VS: 116/84 96 HR 18 RR 98% RA

## 2023-10-07 NOTE — ED Provider Notes (Signed)
 [facility] AT [facility] Provider Note   CSN: [id number] Arrival date & time: 10/07/23  1716     History  Chief Complaint  Patient presents with   Chest Pain    Shaun [name] is a 38 y.o. male.  Patient with history of CHF, CAD, hypertension, IVDU, hyperlipidemia, Crohns presents today with complaints of chest pain, shortness of breath, nausea, vomiting, and diarrhea. He states that he has had chest pain to some degree since he had a defibrillator placed last fall. He states that this pain got worse a few days ago. Notes that he woke up in the night last night with chest pain right over his defibrillator site and is concerned that his defibrillator fired. The area over the defibrillator has been sore since. His defibrillator has never fired before. Also notes that he has been more short of breath since the defibrillator was placed, however this has also worsened recently since he was discontinued from his Bumex. He does not have any leg swelling or abdominal swelling. No cough or congestion. States his chest pain feels like when he had a PE last fall. He recently discontinued anticoagulation. No leg pain. Also notes that he is having nausea, vomiting, and diarrhea with abdominal pain. Pain is generalized. Notes that he has been having hematochezia as well. He is concerned for a flare of his Crohns disease. He recently discontinued his biologic because it caused skin discoloration. Denies fevers or chills. He does note that he has still used IV drugs while he was in prison recently. States that he used IV suboxone in prison because he was in pain and couldn't get any of his pain medications. Has been using oral suboxone since he got out of prison 2 days ago.   The history is provided by the patient. No language interpreter was used.  Chest Pain Associated symptoms: abdominal pain, nausea, shortness of breath and vomiting        Home Medications Prior to  Admission medications   Medication Sig Start Date End Date Taking? Authorizing Provider  apixaban (ELIQUIS) 5 MG TABS tablet Take 1 tablet (5 mg total) by mouth 2 (two) times daily. 01/08/20   Rolly Salter, MD  aspirin EC 81 MG EC tablet Take 1 tablet (81 mg total) by mouth daily. Swallow whole. 01/08/20   Rolly Salter, MD  atorvastatin (LIPITOR) 40 MG tablet Take 1 tablet (40 mg total) by mouth daily at 6 PM. 01/08/20   Rolly Salter, MD  carvedilol (COREG) 3.125 MG tablet Take 1 tablet (3.125 mg total) by mouth 2 (two) times daily with a meal. 01/08/20   Rolly Salter, MD  cyclobenzaprine (FLEXERIL) 10 MG tablet Take 1 tablet (10 mg total) by mouth 3 (three) times daily. 01/08/20   Rolly Salter, MD  famotidine (PEPCID) 20 MG tablet Take 1 tablet (20 mg total) by mouth daily for 6 days. 01/08/20 01/14/20  Rolly Salter, MD  FLUoxetine (PROZAC) 20 MG capsule Take 20 mg by mouth daily.    [provider]  gabapentin (NEURONTIN) 300 MG capsule Take 1 capsule (300 mg total) by mouth 3 (three) times daily for 20 days. 01/08/20 01/28/20  Rolly Salter, MD  levETIRAcetam (KEPPRA PO) Take 1 tablet by mouth daily.    [provider]  lisinopril (ZESTRIL) 10 MG tablet Take 1 tablet (10 mg total) by mouth daily. 01/08/20   Rolly Salter, MD  nicotine (NICODERM CQ -  DOSED IN MG/24 HOURS) 21 mg/24hr patch Place 1 patch (21 mg total) onto the skin daily. 01/08/20   Rolly Salter, MD  polyethylene glycol (MIRALAX / GLYCOLAX) 17 g packet Take 17 g by mouth daily. 01/08/20   Rolly Salter, MD      Allergies    Codeine and Penicillins    Review of Systems   Review of Systems  Respiratory:  Positive for shortness of breath.   Cardiovascular:  Positive for chest pain.  Gastrointestinal:  Positive for abdominal pain, diarrhea, nausea and vomiting.  All other systems reviewed and are negative.   Physical Exam Updated Vital Signs BP 107/76   Pulse 97   Temp 97.8 F (36.6 C)  (Oral)   Resp 20   Ht 6' (1.829 m)   Wt 72.6 kg   SpO2 100%   BMI 21.70 kg/m  Physical Exam Vitals and nursing note reviewed.  Constitutional:      General: He is not in acute distress.    Appearance: Normal appearance. He is normal weight. He is not ill-appearing, toxic-appearing or diaphoretic.  HENT:     Head: Normocephalic and atraumatic.  Cardiovascular:     Rate and Rhythm: Normal rate and regular rhythm.     Pulses:          Dorsalis pedis pulses are 2+ on the right side and 2+ on the left side.       Posterior tibial pulses are 2+ on the right side and 2+ on the left side.     Heart sounds: Normal heart sounds.  Pulmonary:     Effort: Pulmonary effort is normal. No respiratory distress.     Breath sounds: Normal breath sounds.  Chest:     Chest wall: No tenderness.  Abdominal:     General: Abdomen is flat.     Palpations: Abdomen is soft.     Tenderness: There is generalized abdominal tenderness. There is no guarding or rebound.  Musculoskeletal:        General: Normal range of motion.     Cervical back: Normal range of motion.     Right lower leg: No tenderness. No edema.     Left lower leg: No tenderness. No edema.  Skin:    General: Skin is warm and dry.  Neurological:     General: No focal deficit present.     Mental Status: He is alert.  Psychiatric:        Mood and Affect: Mood normal.        Behavior: Behavior normal.     ED Results / Procedures / Treatments   Labs (all labs ordered are listed, but only abnormal results are displayed) Labs Reviewed  BASIC METABOLIC PANEL - Abnormal; Notable for the following components:      Result Value   CO2 21 (*)    Glucose, Bld 124 (*)    BUN 39 (*)    Creatinine, Ser 1.94 (*)    Calcium 8.5 (*)    GFR, Estimated 45 (*)    All other components within normal limits  CBC - Abnormal; Notable for the following components:   Hemoglobin 8.9 (*)    HCT 30.9 (*)    MCV 68.7 (*)    MCH 19.8 (*)    MCHC 28.8  (*)    RDW 18.6 (*)    All other components within normal limits  HEPATIC FUNCTION PANEL - Abnormal; Notable for the following components:   Albumin 3.3 (*)  Total Bilirubin 1.5 (*)    Bilirubin, Direct 0.4 (*)    Indirect Bilirubin 1.1 (*)    All other components within normal limits  I-STAT CHEM 8, ED - Abnormal; Notable for the following components:   BUN 36 (*)    Creatinine, Ser 2.10 (*)    Glucose, Bld 119 (*)    Calcium, Ion 1.02 (*)    TCO2 21 (*)    Hemoglobin 10.2 (*)    HCT 30.0 (*)    All other components within normal limits  TROPONIN I (HIGH SENSITIVITY) - Abnormal; Notable for the following components:   Troponin I (High Sensitivity) 18 (*)    All other components within normal limits  RESP PANEL BY RT-PCR (RSV, FLU A&B, COVID)  RVPGX2  POC OCCULT BLOOD, ED  TROPONIN I (HIGH SENSITIVITY)    EKG None  Radiology CT ABDOMEN PELVIS W CONTRAST Result Date: 10/07/2023 CLINICAL DATA:  Crohn's exacerbation. EXAM: CT ABDOMEN AND PELVIS WITH CONTRAST TECHNIQUE: Multidetector CT imaging of the abdomen and pelvis was performed using the standard protocol following bolus administration of intravenous contrast. RADIATION DOSE REDUCTION: This exam was performed according to the departmental dose-optimization program which includes automated exposure control, adjustment of the mA and/or kV according to patient size and/or use of iterative reconstruction technique. CONTRAST:  65mL OMNIPAQUE IOHEXOL 350 MG/ML SOLN COMPARISON:  October 13, 2022 FINDINGS: Lower chest: No acute abnormality. Hepatobiliary: No focal liver abnormality is seen. No gallstones, gallbladder wall thickening, or biliary dilatation. Pancreas: Unremarkable. No pancreatic ductal dilatation or surrounding inflammatory changes. Spleen: Normal in size without focal abnormality. Adrenals/Urinary Tract: Adrenal glands are unremarkable. Kidneys are normal in size, without renal calculi or hydronephrosis. A 2.0 cm diameter  simple cyst is seen within the lower pole of the left kidney. Bladder is unremarkable. Stomach/Bowel: Stomach is within normal limits. The appendix is not clearly identified. A large stool burden is noted throughout the colon. No evidence of bowel dilatation. The descending colon is mildly thickened and inflamed. Mild thickening of a poorly distended sigmoid colon is also noted. Vascular/Lymphatic: No significant vascular findings are present. No enlarged abdominal or pelvic lymph nodes. Reproductive: Prostate is unremarkable. Other: No abdominal wall hernia or abnormality. No abdominopelvic ascites. Musculoskeletal: No acute or significant osseous findings. IMPRESSION: 1. Mild colitis involving the descending and sigmoid colon. 2. Large stool burden without evidence of bowel obstruction. 3. 2.0 cm diameter simple cyst within the lower pole of the left kidney. No follow-up imaging is recommended. This recommendation follows ACR consensus guidelines: Management of the Incidental Renal Mass on CT: A White Paper of the ACR Incidental Findings Committee. J Am Coll Radiol [phone]. Electronically Signed   By: Aram Candela M.D.   On: 10/07/2023 20:54   CT Angio Chest PE W and/or Wo Contrast Result Date: 10/07/2023 CLINICAL DATA:  Suspected pulmonary embolism. EXAM: CT ANGIOGRAPHY CHEST WITH CONTRAST TECHNIQUE: Multidetector CT imaging of the chest was performed using the standard protocol during bolus administration of intravenous contrast. Multiplanar CT image reconstructions and MIPs were obtained to evaluate the vascular anatomy. RADIATION DOSE REDUCTION: This exam was performed according to the departmental dose-optimization program which includes automated exposure control, adjustment of the mA and/or kV according to patient size and/or use of iterative reconstruction technique. CONTRAST:  65mL OMNIPAQUE IOHEXOL 350 MG/ML SOLN COMPARISON:  None Available. FINDINGS: Cardiovascular: A dual lead AICD is  noted. Satisfactory opacification of the pulmonary arteries to the segmental level. No evidence of pulmonary embolism. Normal heart  size. No pericardial effusion. Mediastinum/Nodes: No enlarged mediastinal, hilar, or axillary lymph nodes. Thyroid gland, trachea, and esophagus demonstrate no significant findings. Lungs/Pleura: Lungs are clear. No pleural effusion or pneumothorax. Upper Abdomen: No acute abnormality. Musculoskeletal: No chest wall abnormality. No acute or significant osseous findings. Review of the MIP images confirms the above findings. IMPRESSION: No evidence of pulmonary embolism or other acute intrathoracic process. Electronically Signed   By: Aram Candela M.D.   On: 10/07/2023 20:46    Procedures Procedures    Medications Ordered in ED Medications  sodium chloride 0.9 % bolus 500 mL (has no administration in time range)  ondansetron (ZOFRAN) injection 4 mg (4 mg Intravenous Given 10/07/23 1933)  fentaNYL (SUBLIMAZE) injection 50 mcg (50 mcg Intravenous Given 10/07/23 1944)    ED Course/ Medical Decision Making/ A&P Clinical Course as of 10/07/23 1959  Wed Oct 07, 2023  1811 ECG shows sinus rhythm HR 99, no evident ischemic findings, baseline artifact lead V2, Qtc 479.  ECG has not crossed over onto Muse [MT]  1925 Hemoglobin(!): 8.9 [JM]    Clinical Course User Index [JM] Mannino, Vickii Chafe, Student-PA [MT] Renaye Rakers Kermit Balo, MD                                 Medical Decision Making Amount and/or Complexity of Data Reviewed Labs: ordered. Radiology: ordered.  Risk Prescription drug management.   This patient is a 38 y.o. male who presents to the ED for concern of chest pain, shortness of breath, nausea, vomiting, diarrhea, this involves an extensive number of treatment options, and is a complaint that carries with it a high risk of complications and morbidity. The emergent differential diagnosis prior to evaluation includes, but is not limited to,  ACS,  pericarditis, myocarditis, aortic dissection, PE, pneumothorax, esophageal rupture, pneumonia, reflux/PUD, biliary disease, pancreatitis, costochondritis, anxiety, CHF, pericardial effusion/tamponade, arrhythmias, ACS, COPD, asthma, bronchitis, anemia   This is not an exhaustive differential.   Past Medical History / Co-morbidities / Social History:  has a past medical history of Beta-hemolytic group B streptococcal sepsis (HCC) (10/2019), Chronic systolic CHF (congestive heart failure) (HCC) (12/30/2019), Coronary artery disease involving native coronary artery of native heart without angina pectoris (12/30/2019), Essential hypertension (12/30/2019), Hypertension, Intravenous drug abuse (HCC) (12/30/2019), Ischemic cardiomyopathy (12/30/2019), Mixed hyperlipidemia (12/30/2019), and Nicotine dependence, cigarettes, uncomplicated (12/30/2019).  Additional history: Chart reviewed. Pertinent results include: last echo in 2024 showed EF 15%-20%  Physical Exam: Physical exam performed. The pertinent findings include: uncomfortable appearing, generalized abdominal ttp without rebound or guarding. No signs of fluid overload. No leg pain or leg swelling  Lab Tests: I ordered, and personally interpreted labs.  The pertinent results include:  hgb 8.9 (appears baseline reviewing labs from [facility] back in the fall), troponin 18 --> 16. Creatinine 2.10 --> 1.90 (up from 0.9 last fall). Hemoccult negative   Imaging Studies: I ordered imaging studies including CT PE, abdomen pelvis. I independently visualized and interpreted imaging which showed   CTA:  No evidence of pulmonary embolism or other acute intrathoracic process.  CT:   1. Mild colitis involving the descending and sigmoid colon. 2. Large stool burden without evidence of bowel obstruction. 3. 2.0 cm diameter simple cyst within the lower pole of the left kidney. No follow-up imaging is recommended.   I agree with the radiologist interpretation.    Cardiac Monitoring:  The patient was maintained on a cardiac monitor.  My attending physician Dr. Renaye Rakers viewed and interpreted the cardiac monitored which showed an underlying rhythm of: no STEMI. I agree with this interpretation.   Medications: I ordered medication including fentanyl, zofran, fluids  for pain, AKI, nausea. Reevaluation of the patient after these medicines showed that the patient improved. I have reviewed the patients home medicines and have made adjustments as needed.  Disposition: After consideration of the diagnostic results and the patients response to treatment, I feel that patient will require admission for AKI. May also be having Crohns flare given symptoms and colitis on CT. Will need gentle fluid hydration given EF 15-20%. Discussed with patient who is understanding and in agreement.  Additionally, received the interrogation of patients pacemaker/defibrillator, the device has never fired and specifically did not fire last night.  Discussed patient with hospitalist Dr. Margo Aye who accepts patient for admission.   Findings and plan of care discussed with supervising physician Dr. Renaye Rakers who is in agreement.   Final Clinical Impression(s) / ED Diagnoses Final diagnoses:  AKI (acute kidney injury) [facility])    Rx / DC Orders ED Discharge Orders     None         Vear Clock 10/07/23 2354    Terald Sleeper, MD 10/08/23 [phone]

## 2023-10-08 ENCOUNTER — Observation Stay (HOSPITAL_COMMUNITY): Payer: Self-pay

## 2023-10-08 DIAGNOSIS — Z79899 Other long term (current) drug therapy: Secondary | ICD-10-CM | POA: Diagnosis not present

## 2023-10-08 DIAGNOSIS — F1910 Other psychoactive substance abuse, uncomplicated: Secondary | ICD-10-CM | POA: Diagnosis present

## 2023-10-08 DIAGNOSIS — E876 Hypokalemia: Secondary | ICD-10-CM | POA: Diagnosis present

## 2023-10-08 DIAGNOSIS — I5023 Acute on chronic systolic (congestive) heart failure: Secondary | ICD-10-CM | POA: Diagnosis not present

## 2023-10-08 DIAGNOSIS — Z880 Allergy status to penicillin: Secondary | ICD-10-CM | POA: Diagnosis not present

## 2023-10-08 DIAGNOSIS — Z7984 Long term (current) use of oral hypoglycemic drugs: Secondary | ICD-10-CM | POA: Diagnosis not present

## 2023-10-08 DIAGNOSIS — L309 Dermatitis, unspecified: Secondary | ICD-10-CM | POA: Diagnosis present

## 2023-10-08 DIAGNOSIS — I340 Nonrheumatic mitral (valve) insufficiency: Secondary | ICD-10-CM | POA: Diagnosis not present

## 2023-10-08 DIAGNOSIS — R079 Chest pain, unspecified: Secondary | ICD-10-CM | POA: Diagnosis not present

## 2023-10-08 DIAGNOSIS — I252 Old myocardial infarction: Secondary | ICD-10-CM | POA: Diagnosis not present

## 2023-10-08 DIAGNOSIS — Z7901 Long term (current) use of anticoagulants: Secondary | ICD-10-CM | POA: Diagnosis not present

## 2023-10-08 DIAGNOSIS — I513 Intracardiac thrombosis, not elsewhere classified: Secondary | ICD-10-CM | POA: Diagnosis present

## 2023-10-08 DIAGNOSIS — Z95810 Presence of automatic (implantable) cardiac defibrillator: Secondary | ICD-10-CM | POA: Diagnosis not present

## 2023-10-08 DIAGNOSIS — F17210 Nicotine dependence, cigarettes, uncomplicated: Secondary | ICD-10-CM | POA: Diagnosis present

## 2023-10-08 DIAGNOSIS — I5043 Acute on chronic combined systolic (congestive) and diastolic (congestive) heart failure: Secondary | ICD-10-CM | POA: Diagnosis not present

## 2023-10-08 DIAGNOSIS — I428 Other cardiomyopathies: Secondary | ICD-10-CM | POA: Diagnosis present

## 2023-10-08 DIAGNOSIS — N179 Acute kidney failure, unspecified: Secondary | ICD-10-CM | POA: Diagnosis present

## 2023-10-08 DIAGNOSIS — I130 Hypertensive heart and chronic kidney disease with heart failure and stage 1 through stage 4 chronic kidney disease, or unspecified chronic kidney disease: Secondary | ICD-10-CM | POA: Diagnosis present

## 2023-10-08 DIAGNOSIS — E782 Mixed hyperlipidemia: Secondary | ICD-10-CM | POA: Diagnosis present

## 2023-10-08 DIAGNOSIS — D631 Anemia in chronic kidney disease: Secondary | ICD-10-CM | POA: Diagnosis present

## 2023-10-08 DIAGNOSIS — B182 Chronic viral hepatitis C: Secondary | ICD-10-CM | POA: Diagnosis present

## 2023-10-08 DIAGNOSIS — Z955 Presence of coronary angioplasty implant and graft: Secondary | ICD-10-CM | POA: Diagnosis not present

## 2023-10-08 DIAGNOSIS — Z885 Allergy status to narcotic agent status: Secondary | ICD-10-CM | POA: Diagnosis not present

## 2023-10-08 DIAGNOSIS — K50911 Crohn's disease, unspecified, with rectal bleeding: Secondary | ICD-10-CM | POA: Diagnosis present

## 2023-10-08 DIAGNOSIS — I255 Ischemic cardiomyopathy: Secondary | ICD-10-CM | POA: Diagnosis present

## 2023-10-08 DIAGNOSIS — D509 Iron deficiency anemia, unspecified: Secondary | ICD-10-CM | POA: Diagnosis present

## 2023-10-08 DIAGNOSIS — I214 Non-ST elevation (NSTEMI) myocardial infarction: Secondary | ICD-10-CM | POA: Diagnosis not present

## 2023-10-08 DIAGNOSIS — N1831 Chronic kidney disease, stage 3a: Secondary | ICD-10-CM | POA: Diagnosis present

## 2023-10-08 LAB — COMPREHENSIVE METABOLIC PANEL
ALT: 12 U/L (ref 0–44)
AST: 21 U/L (ref 15–41)
Albumin: 2.7 g/dL — ABNORMAL LOW (ref 3.5–5.0)
Alkaline Phosphatase: 68 U/L (ref 38–126)
Anion gap: 11 (ref 5–15)
BUN: 33 mg/dL — ABNORMAL HIGH (ref 6–20)
CO2: 24 mmol/L (ref 22–32)
Calcium: 7.8 mg/dL — ABNORMAL LOW (ref 8.9–10.3)
Chloride: 101 mmol/L (ref 98–111)
Creatinine, Ser: 1.63 mg/dL — ABNORMAL HIGH (ref 0.61–1.24)
GFR, Estimated: 55 mL/min — ABNORMAL LOW (ref >60)
Glucose, Bld: 96 mg/dL (ref 70–99)
Potassium: 3.2 mmol/L — ABNORMAL LOW (ref 3.5–5.1)
Sodium: 136 mmol/L (ref 135–145)
Total Bilirubin: 0.9 mg/dL (ref 0.0–1.2)
Total Protein: 6.3 g/dL — ABNORMAL LOW (ref 6.5–8.1)

## 2023-10-08 LAB — CBC WITH DIFFERENTIAL/PLATELET
Abs Immature Granulocytes: 0.01 10*3/uL (ref 0.00–0.07)
Basophils Absolute: 0.1 10*3/uL (ref 0.0–0.1)
Basophils Relative: 1 %
Eosinophils Absolute: 0.2 10*3/uL (ref 0.0–0.5)
Eosinophils Relative: 3 %
HCT: 29.3 % — ABNORMAL LOW (ref 39.0–52.0)
Hemoglobin: 8.6 g/dL — ABNORMAL LOW (ref 13.0–17.0)
Immature Granulocytes: 0 %
Lymphocytes Relative: 26 %
Lymphs Abs: 1.3 10*3/uL (ref 0.7–4.0)
MCH: 19.9 pg — ABNORMAL LOW (ref 26.0–34.0)
MCHC: 29.4 g/dL — ABNORMAL LOW (ref 30.0–36.0)
MCV: 67.8 fL — ABNORMAL LOW (ref 80.0–100.0)
Monocytes Absolute: 0.6 10*3/uL (ref 0.1–1.0)
Monocytes Relative: 11 %
Neutro Abs: 3 10*3/uL (ref 1.7–7.7)
Neutrophils Relative %: 59 %
Platelets: 307 10*3/uL (ref 150–400)
RBC: 4.32 MIL/uL (ref 4.22–5.81)
RDW: 18.4 % — ABNORMAL HIGH (ref 11.5–15.5)
Smear Review: NORMAL
WBC: 5.1 10*3/uL (ref 4.0–10.5)
nRBC: 0 % (ref 0.0–0.2)

## 2023-10-08 LAB — RETIC PANEL
Immature Retic Fract: 26.6 % — ABNORMAL HIGH (ref 2.3–15.9)
RBC.: 4.47 MIL/uL (ref 4.22–5.81)
Retic Count, Absolute: 73.8 10*3/uL (ref 19.0–186.0)
Retic Ct Pct: 1.7 % (ref 0.4–3.1)
Reticulocyte Hemoglobin: 16.6 pg — ABNORMAL LOW (ref >27.9)

## 2023-10-08 LAB — ECHOCARDIOGRAM COMPLETE
AR max vel: 3.31 cm2
AV Area VTI: 3.05 cm2
AV Area mean vel: 3.19 cm2
AV Mean grad: 2 mmHg
AV Peak grad: 3.4 mmHg
Ao pk vel: 0.92 m/s
Area-P 1/2: 4.36 cm2
Est EF: <20
Height: 72 in
S' Lateral: 6.7 cm
Weight: 2560 [oz_av]

## 2023-10-08 LAB — PROTIME-INR
INR: 1.4 — ABNORMAL HIGH (ref 0.8–1.2)
Prothrombin Time: 17.3 s — ABNORMAL HIGH (ref 11.4–15.2)

## 2023-10-08 LAB — FOLATE: Folate: 12 ng/mL (ref >5.9)

## 2023-10-08 LAB — FERRITIN: Ferritin: 8 ng/mL — ABNORMAL LOW (ref 24–336)

## 2023-10-08 LAB — CBC
HCT: 27.5 % — ABNORMAL LOW (ref 39.0–52.0)
Hemoglobin: 8 g/dL — ABNORMAL LOW (ref 13.0–17.0)
MCH: 20 pg — ABNORMAL LOW (ref 26.0–34.0)
MCHC: 29.1 g/dL — ABNORMAL LOW (ref 30.0–36.0)
MCV: 68.8 fL — ABNORMAL LOW (ref 80.0–100.0)
Platelets: 327 10*3/uL (ref 150–400)
RBC: 4 MIL/uL — ABNORMAL LOW (ref 4.22–5.81)
RDW: 18.3 % — ABNORMAL HIGH (ref 11.5–15.5)
WBC: 5.2 10*3/uL (ref 4.0–10.5)
nRBC: 0 % (ref 0.0–0.2)

## 2023-10-08 LAB — C-REACTIVE PROTEIN: CRP: 2 mg/dL — ABNORMAL HIGH (ref <1.0)

## 2023-10-08 LAB — IRON AND TIBC
Iron: 19 ug/dL — ABNORMAL LOW (ref 45–182)
Saturation Ratios: 4 % — ABNORMAL LOW (ref 17.9–39.5)
TIBC: 455 ug/dL — ABNORMAL HIGH (ref 250–450)
UIBC: 436 ug/dL

## 2023-10-08 LAB — HIV ANTIBODY (ROUTINE TESTING W REFLEX): HIV Screen 4th Generation wRfx: NONREACTIVE

## 2023-10-08 LAB — VITAMIN B12: Vitamin B-12: 674 pg/mL (ref 180–914)

## 2023-10-08 LAB — TRANSFERRIN: Transferrin: 317 mg/dL (ref 180–329)

## 2023-10-08 LAB — PHOSPHORUS: Phosphorus: 4 mg/dL (ref 2.5–4.6)

## 2023-10-08 LAB — MRSA NEXT GEN BY PCR, NASAL: MRSA by PCR Next Gen: DETECTED — AB

## 2023-10-08 LAB — APTT: aPTT: 34 s (ref 24–36)

## 2023-10-08 LAB — SEDIMENTATION RATE: Sed Rate: 9 mm/h (ref 0–16)

## 2023-10-08 LAB — MAGNESIUM: Magnesium: 1.8 mg/dL (ref 1.7–2.4)

## 2023-10-08 MED ORDER — HYDROMORPHONE HCL 1 MG/ML IJ SOLN
INTRAMUSCULAR | Status: AC
  Filled 2023-10-08: qty 1

## 2023-10-08 MED ORDER — CHLORHEXIDINE GLUCONATE CLOTH 2 % EX PADS
6.0000 | MEDICATED_PAD | Freq: Every day | CUTANEOUS | Status: DC
  Administered 2023-10-09 – 2023-10-11 (×3): 6 via TOPICAL

## 2023-10-08 MED ORDER — METRONIDAZOLE 500 MG/100ML IV SOLN
500.0000 mg | Freq: Two times a day (BID) | INTRAVENOUS | Status: DC
  Administered 2023-10-08 – 2023-10-12 (×9): 500 mg via INTRAVENOUS
  Filled 2023-10-08 (×9): qty 100

## 2023-10-08 MED ORDER — HYDROMORPHONE HCL 1 MG/ML IJ SOLN
0.5000 mg | INTRAMUSCULAR | Status: AC | PRN
  Administered 2023-10-08 (×4): 0.5 mg via INTRAVENOUS
  Filled 2023-10-08 (×4): qty 1

## 2023-10-08 MED ORDER — MUPIROCIN 2 % EX OINT
1.0000 | TOPICAL_OINTMENT | Freq: Two times a day (BID) | CUTANEOUS | Status: DC
  Administered 2023-10-08 – 2023-10-12 (×7): 1 via NASAL
  Filled 2023-10-08: qty 22

## 2023-10-08 MED ORDER — POLYETHYLENE GLYCOL 3350 17 G PO PACK
17.0000 g | PACK | Freq: Every day | ORAL | Status: DC | PRN
Start: 2023-10-08 — End: 2023-10-12

## 2023-10-08 MED ORDER — HEPARIN BOLUS VIA INFUSION
4000.0000 [IU] | Freq: Once | INTRAVENOUS | Status: AC
  Administered 2023-10-08: 4000 [IU] via INTRAVENOUS
  Filled 2023-10-08: qty 4000

## 2023-10-08 MED ORDER — OXYCODONE HCL 5 MG PO TABS
5.0000 mg | ORAL_TABLET | Freq: Four times a day (QID) | ORAL | Status: AC | PRN
  Administered 2023-10-08 – 2023-10-09 (×4): 5 mg via ORAL
  Filled 2023-10-08 (×5): qty 1

## 2023-10-08 MED ORDER — PERFLUTREN LIPID MICROSPHERE
1.0000 mL | INTRAVENOUS | Status: AC | PRN
  Administered 2023-10-08: 2 mL via INTRAVENOUS

## 2023-10-08 MED ORDER — SODIUM CHLORIDE 0.9 % IV SOLN
INTRAVENOUS | Status: DC
  Filled 2023-10-08 (×2): qty 1000

## 2023-10-08 MED ORDER — SODIUM CHLORIDE 0.9 % IV SOLN: INTRAVENOUS | Status: DC

## 2023-10-08 MED ORDER — HEPARIN (PORCINE) 25000 UT/250ML-% IV SOLN
2250.0000 [IU]/h | INTRAVENOUS | Status: DC
  Administered 2023-10-08: 1200 [IU]/h via INTRAVENOUS
  Administered 2023-10-09: 1400 [IU]/h via INTRAVENOUS
  Administered 2023-10-10: 2000 [IU]/h via INTRAVENOUS
  Administered 2023-10-10: 1650 [IU]/h via INTRAVENOUS
  Administered 2023-10-11 – 2023-10-12 (×3): 2250 [IU]/h via INTRAVENOUS
  Filled 2023-10-08 (×7): qty 250

## 2023-10-08 MED ORDER — POTASSIUM CHLORIDE CRYS ER 20 MEQ PO TBCR
40.0000 meq | EXTENDED_RELEASE_TABLET | Freq: Once | ORAL | Status: AC
  Administered 2023-10-08: 40 meq via ORAL
  Filled 2023-10-08: qty 2

## 2023-10-08 MED ORDER — MELATONIN 5 MG PO TABS
5.0000 mg | ORAL_TABLET | Freq: Every evening | ORAL | Status: DC | PRN
  Administered 2023-10-08: 5 mg via ORAL
  Filled 2023-10-08: qty 1

## 2023-10-08 MED ORDER — PROCHLORPERAZINE EDISYLATE 10 MG/2ML IJ SOLN
5.0000 mg | Freq: Four times a day (QID) | INTRAMUSCULAR | Status: DC | PRN
  Administered 2023-10-08 – 2023-10-12 (×9): 5 mg via INTRAVENOUS
  Filled 2023-10-08 (×9): qty 2

## 2023-10-08 MED ORDER — SODIUM CHLORIDE 0.9 % IV SOLN
2.0000 g | Freq: Every day | INTRAVENOUS | Status: DC
  Administered 2023-10-08 – 2023-10-12 (×5): 2 g via INTRAVENOUS
  Filled 2023-10-08 (×5): qty 20

## 2023-10-08 NOTE — Plan of Care (Signed)

## 2023-10-08 NOTE — Progress Notes (Signed)
 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  H&P, vitals and all the labs and imaging studies reviewed.  Patient seen and examined in the ED.  He is not complaining of chest pain anymore.  Troponins were very slightly elevated and flat, not indicative of ACS, ACS ruled out.  Despite of having elevated BNP and not knowing baseline, he appears euvolemic, has a history of chronic heart failure with reduced ejection fraction.  Also has a history of chron's disease but CT scan at this time shows left sided colitis and he does complain of abdominal pain on the left side and is tender to palpation as well.  He is nauseous but no more vomiting, last vomiting was last night.  Stool studies are pending but this is likely infectious, he has a sick contact, the person that he was living with.  I will start him on Rocephin and Flagyl for this.  UDS is pending.  Continue symptomatic treatment with some IV fluids.  Hypokalemia has been replenished.  Upon extensive chart review, it appears that patient's creatinine was totally normal up until August 2021, his creatinine upon arrival this time was 1.94, presumably AKI until proven otherwise.  Creatinine improved to 1.63.  Resume IV fluids with the same.  Has anemia of chronic disease, baseline hemoglobin around 11 but currently 8.0, was 10.2 yesterday.  No history of melena, hematochezia or hematemesis.  Will expand anemia workup with iron studies, B12, folate and FOBT.  On examination, left lower abdominal tenderness.  Lungs clear to auscultation.  Total time spent on this encounter was 39 minutes

## 2023-10-08 NOTE — ED Notes (Signed)
 Dr Margo Aye made aware that patient had 30 beat run of vtach. Vitals are stable and pt denies chest pain at this time

## 2023-10-08 NOTE — Progress Notes (Signed)
 PHARMACY - ANTICOAGULATION CONSULT NOTE  Pharmacy Consult for Heparin Indication: LV thrombus  Allergies  Allergen Reactions   Amoxicillin Swelling   Codeine Itching   Penicillins     Itching Did it involve swelling of the face/tongue/throat, SOB, or low BP? SOB & Swelling Which Caused Blood Clots Did it involve sudden or severe rash/hives, skin peeling, or any reaction on the inside of your mouth or nose? No Did you need to seek medical attention at a hospital or doctor's office? Y When did it last happen?  Over month ago     If all above answers are "NO", may proceed with cephalosporin use.     Ketorolac Itching and Other (See Comments)    nightmares    Patient Measurements: Height: 6' (182.9 cm) Weight: 72.6 kg (160 lb) IBW/kg (Calculated) : 77.6 Heparin Dosing Weight: 72.6 kg  Vital Signs: Temp: 97.7 F (36.5 C) (03/20 1922) Temp Source: Oral (03/20 1922) BP: 108/83 (03/20 1922) Pulse Rate: 91 (03/20 1922)  Labs: Recent Labs    10/07/23 1843 10/07/23 1848 10/07/23 1935 10/08/23 0431  HGB 8.9* 10.2*  --  8.0*  HCT 30.9* 30.0*  --  27.5*  PLT 399  --   --  327  CREATININE 1.94* 2.10*  --  1.63*  TROPONINIHS 18*  --  16  --     Estimated Creatinine Clearance: 63.1 mL/min (A) (by C-G formula based on SCr of 1.63 mg/dL (H)).   Medical History: Past Medical History:  Diagnosis Date   Beta-hemolytic group B streptococcal sepsis (HCC) 10/2019   at [facility]   Chronic systolic CHF (congestive heart failure) (HCC) 12/30/2019   Coronary artery disease involving native coronary artery of native heart without angina pectoris 12/30/2019   Essential hypertension 12/30/2019   Hypertension    Intravenous drug abuse (HCC) 12/30/2019   Ischemic cardiomyopathy 12/30/2019   Mixed hyperlipidemia 12/30/2019   Nicotine dependence, cigarettes, uncomplicated 12/30/2019    Medications:  Scheduled:   enoxaparin (LOVENOX) injection  40 mg Subcutaneous Q24H   Infusions:    cefTRIAXone (ROCEPHIN)  IV Stopped (10/08/23 1133)   metronidazole Stopped (10/08/23 1346)   sodium chloride 0.9 % 1,000 mL with potassium chloride 40 mEq infusion 100 mL/hr at 10/08/23 1819   PRN: HYDROmorphone (DILAUDID) injection, melatonin, oxyCODONE, polyethylene glycol, prochlorperazine  Assessment: 38 yo male admitted with chest pain. Echo obtained which cannot rule out LV thrombus. Pharmacy consulted to dose IV heparin. 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  Goal of Therapy:  Heparin level 0.3-0.7 units/ml Monitor platelets by anticoagulation protocol: Yes   Plan:  Give 4000 units bolus x 1 Start heparin infusion at 1200 units/hr Check anti-Xa level in 8 hours and daily while on heparin Continue to monitor H&H and platelets  Loralee Pacas, PharmD, BCPS 10/08/2023,8:12 PM  Please check AMION for all [facility] Pharmacy phone numbers After 10:00 PM, call Main Pharmacy [phone]

## 2023-10-08 NOTE — H&P (Addendum)
 History and Physical  Shaun [name] ZOX:[id number] DOB: 05-10-1986 DOA: 10/07/2023  Referring physician: Lurena Nida, PA-EDP  PCP: Maye Hides, PA  Outpatient Specialists: Cardiology, GI Patient coming from: Home  Chief Complaint: Chest pain, abdominal pain, nausea, and vomiting  HPI: Shaun [name] is a 38 y.o. 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  Associated with nausea and vomiting x 3 weeks.  States his brother whom he lives with has been having nausea and vomiting as well for the past few days.  Admits to night sweats.  Last bowel movement was yesterday and solid.  States he had mucousy blood around the stool.  He stopped taking his Crohn's medications 2 months ago.  In the ER, afebrile with no leukocytosis.  His pacemaker was interrogated.  No recent firing.  High-sensitivity troponin flat 18, 16.  CTA negative for PE CT abdomen pelvis with contrast showing mild colitis involving the descending and sigmoid colon.  Large stool burden without evidence of bowel obstruction.  2.0 cm diameter simple cyst within the lower pole of left kidney.  In the ER, the patient received gentle IV fluid hydration and opioid-based analgesics.  TRH, hospitalist service asked to admit.   ED Course: Temperature 98.  BP 107/69, pulse 66, respiratory 15, O2 saturation 96% on room air.  Lab studies notable for WBC 5.2, hemoglobin 8.0, MCV 68, platelet 327.  Serum potassium 3.2, BUN 33, creatinine 1.63, albumin 2.7, GFR 55.  BNP 1830.  Troponin 18, 16.  Review of Systems: Review of systems as noted in the HPI. All other systems reviewed and are negative.   Past Medical History:  Diagnosis Date   Beta-hemolytic  group B streptococcal sepsis (HCC) 10/2019   at [facility]   Chronic systolic CHF (congestive heart failure) (HCC) 12/30/2019   Coronary artery disease involving native coronary artery of native heart without angina pectoris 12/30/2019   Essential hypertension 12/30/2019   Hypertension    Intravenous drug abuse (HCC) 12/30/2019   Ischemic cardiomyopathy 12/30/2019   Mixed hyperlipidemia 12/30/2019   Nicotine dependence, cigarettes, uncomplicated 12/30/2019   Past Surgical History:  Procedure Laterality Date   I & D EXTREMITY Left 01/03/2020   Procedure: IRRIGATION AND DEBRIDEMENT LEFT UPPER EXTREMITY;  Surgeon: Sheral Apley, MD;  Location: [facility];  Service: Orthopedics;  Laterality: Left;    Social History:  reports that he has been smoking cigarettes. He has never used smokeless tobacco. He reports current drug use. Drug: IV. He reports that he does not drink alcohol.   Allergies  Allergen Reactions   Amoxicillin Swelling   Codeine Itching   Penicillins     Itching Did it involve swelling of the face/tongue/throat, SOB, or low BP? SOB & Swelling Which Caused Blood Clots Did it involve sudden or severe rash/hives, skin peeling, or any reaction on the inside of your mouth or nose? No Did you need to seek medical attention at a hospital or doctor's office? Y When did it last happen?  Over month ago     If all above answers are "NO", may proceed with cephalosporin use.     Ketorolac Itching and Other (See Comments)    nightmares    Family History  Family history unknown: Yes      Prior to Admission medications   Medication Sig Start Date End Date Taking? Authorizing Provider  aspirin EC 81 MG EC tablet Take 1 tablet (81 mg total) by mouth daily. Swallow whole. 01/08/20  Yes Rolly Salter, MD  carvedilol (COREG) 3.125 MG tablet Take 1 tablet (3.125 mg total) by mouth 2 (two) times daily with a meal. 01/08/20  Yes Rolly Salter, MD  levETIRAcetam (KEPPRA PO) Take 1 tablet by  mouth daily. Patient not taking: Reported on 10/08/2023    [provider]    Physical Exam: BP 107/69   Pulse 66   Temp 98 F (36.7 C) (Oral)   Resp 15   Ht 6' (1.829 m)   Wt 72.6 kg   SpO2 96%   BMI 21.70 kg/m   General: 38 y.o. year-old male well developed well nourished in no acute distress.  Alert and oriented x3. Cardiovascular: Regular rate and rhythm with no rubs or gallops.  No thyromegaly or JVD noted.  No lower extremity edema. 2/4 pulses in all 4 extremities. Respiratory: Clear to auscultation with no wheezes or rales. Good inspiratory effort. Abdomen: Soft tender diffusely.  Nondistended with normal bowel sounds x4 quadrants. Muskuloskeletal: No cyanosis, clubbing or edema noted bilaterally Neuro: CN II-XII intact, strength, sensation, reflexes Skin: No ulcerative lesions noted or rashes Psychiatry: Judgement and insight appear normal. Mood is appropriate for condition and setting          Labs on Admission:  Basic Metabolic Panel: Recent Labs  Lab 10/07/23 1843 10/07/23 1848 10/08/23 0431  NA 135 140 136  K 4.4 4.5 3.2*  CL 103 106 101  CO2 21*  --  24  GLUCOSE 124* 119* 96  BUN 39* 36* 33*  CREATININE 1.94* 2.10* 1.63*  CALCIUM 8.5*  --  7.8*  MG  --   --  1.8  PHOS  --   --  4.0   Liver Function Tests: Recent Labs  Lab 10/07/23 1843 10/08/23 0431  AST 26 21  ALT 14 12  ALKPHOS 72 68  BILITOT 1.5* 0.9  PROT 7.6 6.3*  ALBUMIN 3.3* 2.7*   No results for input(s): "LIPASE", "AMYLASE" in the last 168 hours. No results for input(s): "AMMONIA" in the last 168 hours. CBC: Recent Labs  Lab 10/07/23 1843 10/07/23 1848 10/08/23 0431  WBC 6.7  --  5.2  HGB 8.9* 10.2* 8.0*  HCT 30.9* 30.0* 27.5*  MCV 68.7*  --  68.8*  PLT 399  --  327   Cardiac Enzymes: No results for input(s): "CKTOTAL", "CKMB", "CKMBINDEX", "TROPONINI" in the last 168 hours.  BNP (last 3 results) Recent Labs    10/07/23 2312  BNP 1,830.7*    ProBNP (last 3  results) No results for input(s): "PROBNP" in the last 8760 hours.  CBG: No results for input(s): "GLUCAP" in the last 168 hours.  Radiological Exams on Admission: CT ABDOMEN PELVIS W CONTRAST Result Date: 10/07/2023 CLINICAL DATA:  Crohn's exacerbation. EXAM: CT ABDOMEN AND PELVIS WITH CONTRAST TECHNIQUE: Multidetector CT imaging of the abdomen and pelvis was performed using the standard protocol following bolus administration of intravenous contrast. RADIATION DOSE REDUCTION: This exam was performed according to the departmental dose-optimization program which includes automated exposure control, adjustment of the mA and/or kV according to patient size and/or use of iterative reconstruction technique. CONTRAST:  65mL OMNIPAQUE IOHEXOL 350 MG/ML SOLN COMPARISON:  October 13, 2022 FINDINGS: Lower chest: No acute abnormality.  Hepatobiliary: No focal liver abnormality is seen. No gallstones, gallbladder wall thickening, or biliary dilatation. Pancreas: Unremarkable. No pancreatic ductal dilatation or surrounding inflammatory changes. Spleen: Normal in size without focal abnormality. Adrenals/Urinary Tract: Adrenal glands are unremarkable. Kidneys are normal in size, without renal calculi or hydronephrosis. A 2.0 cm diameter simple cyst is seen within the lower pole of the left kidney. Bladder is unremarkable. Stomach/Bowel: Stomach is within normal limits. The appendix is not clearly identified. A large stool burden is noted throughout the colon. No evidence of bowel dilatation. The descending colon is mildly thickened and inflamed. Mild thickening of a poorly distended sigmoid colon is also noted. Vascular/Lymphatic: No significant vascular findings are present. No enlarged abdominal or pelvic lymph nodes. Reproductive: Prostate is unremarkable. Other: No abdominal wall hernia or abnormality. No abdominopelvic ascites. Musculoskeletal: No acute or significant osseous findings. IMPRESSION: 1. Mild colitis  involving the descending and sigmoid colon. 2. Large stool burden without evidence of bowel obstruction. 3. 2.0 cm diameter simple cyst within the lower pole of the left kidney. No follow-up imaging is recommended. This recommendation follows ACR consensus guidelines: Management of the Incidental Renal Mass on CT: A White Paper of the ACR Incidental Findings Committee. J Am Coll Radiol [phone]. Electronically Signed   By: Aram Candela M.D.   On: 10/07/2023 20:54   CT Angio Chest PE W and/or Wo Contrast Result Date: 10/07/2023 CLINICAL DATA:  Suspected pulmonary embolism. EXAM: CT ANGIOGRAPHY CHEST WITH CONTRAST TECHNIQUE: Multidetector CT imaging of the chest was performed using the standard protocol during bolus administration of intravenous contrast. Multiplanar CT image reconstructions and MIPs were obtained to evaluate the vascular anatomy. RADIATION DOSE REDUCTION: This exam was performed according to the departmental dose-optimization program which includes automated exposure control, adjustment of the mA and/or kV according to patient size and/or use of iterative reconstruction technique. CONTRAST:  65mL OMNIPAQUE IOHEXOL 350 MG/ML SOLN COMPARISON:  None Available. FINDINGS: Cardiovascular: A dual lead AICD is noted. Satisfactory opacification of the pulmonary arteries to the segmental level. No evidence of pulmonary embolism. Normal heart size. No pericardial effusion. Mediastinum/Nodes: No enlarged mediastinal, hilar, or axillary lymph nodes. Thyroid gland, trachea, and esophagus demonstrate no significant findings. Lungs/Pleura: Lungs are clear. No pleural effusion or pneumothorax. Upper Abdomen: No acute abnormality. Musculoskeletal: No chest wall abnormality. No acute or significant osseous findings. Review of the MIP images confirms the above findings. IMPRESSION: No evidence of pulmonary embolism or other acute intrathoracic process. Electronically Signed   By: Aram Candela M.D.    On: 10/07/2023 20:46    EKG: I independently viewed the EKG done and my findings are as followed: None available at the time of this visit.  Assessment/Plan Present on Admission:  Chest pain  Principal Problem:   Chest pain  Chest pain, rule out ACS Follow 2D echo High-sensitivity troponin flat CT angio negative for PE Monitor on telemetry  Chronic HFrEF Euvolemic on exam BNP greater than 1800 Follow 2D echo Monitor volume status  Chron's disease with mild colitis seen on CT scan Afebrile, no leukocytosis, no diarrhea reported Follow inflammatory markers CRP and sed rate, if elevated consider consulting GI. Reports abdominal pain As needed analgesics Received gentle IV fluid hydration in the ER  Nausea and vomiting possible secondary to viral gastroenteritis Continue supportive care IV antiemetics as needed  IV drug abuse History of MSSA bacteremia in October 2024 Self injected Suboxone while in prison UDS is pending Follow peripheral blood cultures x 2  Post pacemaker  placement Was interrogated in the ER no recent firing  CKD 3A At baseline Avoid nephrotoxic agents and hypotension Monitor urine output  Hypokalemia Serum potassium 3.2 Repleted orally  Anemia of chronic disease Hemoglobin 8.0 Monitor H&H  History of hypertension Blood pressures are currently soft Resume home beta-blocker as blood pressure allows Closely monitor vital signs   Time: 75 minutes.   DVT prophylaxis: Subcu Lovenox daily.  Code Status: Full code.  Family Communication: None at bedside.  Disposition Plan: Admitted to telemetry cardiac unit.  Consults called: None.  Admission status: Observation status.   Status is: Observation    Darlin Drop MD [facility] Pager [phone]  If 7PM-7AM, please contact night-coverage [email or web address] Password [employer] [facility]  10/08/2023, 6:27 AM

## 2023-10-08 NOTE — ED Notes (Signed)
 Pt asked nurse about have a solid food diet but primary nurse stated that d/t his diagnosis that clear liquids is what the doctor order. Pt stated that if he can not have food that he will AMA. MD notified of situation and possible diet change

## 2023-10-09 ENCOUNTER — Inpatient Hospital Stay (HOSPITAL_COMMUNITY)

## 2023-10-09 DIAGNOSIS — I5043 Acute on chronic combined systolic (congestive) and diastolic (congestive) heart failure: Secondary | ICD-10-CM

## 2023-10-09 DIAGNOSIS — R079 Chest pain, unspecified: Secondary | ICD-10-CM | POA: Diagnosis not present

## 2023-10-09 DIAGNOSIS — I340 Nonrheumatic mitral (valve) insufficiency: Secondary | ICD-10-CM | POA: Diagnosis not present

## 2023-10-09 LAB — HEPARIN LEVEL (UNFRACTIONATED)
Heparin Unfractionated: <0.1 [IU]/mL — ABNORMAL LOW (ref 0.30–0.70)
Heparin Unfractionated: <0.1 [IU]/mL — ABNORMAL LOW (ref 0.30–0.70)
Heparin Unfractionated: <0.1 [IU]/mL — ABNORMAL LOW (ref 0.30–0.70)

## 2023-10-09 LAB — BLOOD CULTURE ID PANEL (REFLEXED) - BCID2

## 2023-10-09 LAB — CBC WITH DIFFERENTIAL/PLATELET
Abs Immature Granulocytes: 0.02 10*3/uL (ref 0.00–0.07)
Basophils Absolute: 0 10*3/uL (ref 0.0–0.1)
Basophils Relative: 1 %
Eosinophils Absolute: 0.2 10*3/uL (ref 0.0–0.5)
Eosinophils Relative: 3 %
HCT: 29 % — ABNORMAL LOW (ref 39.0–52.0)
Hemoglobin: 8.5 g/dL — ABNORMAL LOW (ref 13.0–17.0)
Immature Granulocytes: 0 %
Lymphocytes Relative: 25 %
Lymphs Abs: 1.2 10*3/uL (ref 0.7–4.0)
MCH: 19.6 pg — ABNORMAL LOW (ref 26.0–34.0)
MCHC: 29.3 g/dL — ABNORMAL LOW (ref 30.0–36.0)
MCV: 66.8 fL — ABNORMAL LOW (ref 80.0–100.0)
Monocytes Absolute: 0.6 10*3/uL (ref 0.1–1.0)
Monocytes Relative: 13 %
Neutro Abs: 2.9 10*3/uL (ref 1.7–7.7)
Neutrophils Relative %: 58 %
Platelets: 357 10*3/uL (ref 150–400)
RBC: 4.34 MIL/uL (ref 4.22–5.81)
RDW: 18.1 % — ABNORMAL HIGH (ref 11.5–15.5)
WBC: 4.9 10*3/uL (ref 4.0–10.5)
nRBC: 0 % (ref 0.0–0.2)

## 2023-10-09 LAB — COMPREHENSIVE METABOLIC PANEL
ALT: 15 U/L (ref 0–44)
AST: 29 U/L (ref 15–41)
Albumin: 2.7 g/dL — ABNORMAL LOW (ref 3.5–5.0)
Alkaline Phosphatase: 94 U/L (ref 38–126)
Anion gap: 6 (ref 5–15)
BUN: 32 mg/dL — ABNORMAL HIGH (ref 6–20)
CO2: 21 mmol/L — ABNORMAL LOW (ref 22–32)
Calcium: 7.7 mg/dL — ABNORMAL LOW (ref 8.9–10.3)
Chloride: 106 mmol/L (ref 98–111)
Creatinine, Ser: 1.49 mg/dL — ABNORMAL HIGH (ref 0.61–1.24)
GFR, Estimated: >60 mL/min (ref >60)
Glucose, Bld: 103 mg/dL — ABNORMAL HIGH (ref 70–99)
Potassium: 4.5 mmol/L (ref 3.5–5.1)
Sodium: 133 mmol/L — ABNORMAL LOW (ref 135–145)
Total Bilirubin: 1 mg/dL (ref 0.0–1.2)
Total Protein: 6.5 g/dL (ref 6.5–8.1)

## 2023-10-09 LAB — OCCULT BLOOD X 1 CARD TO LAB, STOOL: Fecal Occult Bld: POSITIVE — AB

## 2023-10-09 MED ORDER — FUROSEMIDE 10 MG/ML IJ SOLN
80.0000 mg | Freq: Once | INTRAMUSCULAR | Status: DC
  Filled 2023-10-09: qty 8

## 2023-10-09 MED ORDER — FUROSEMIDE 10 MG/ML IJ SOLN
80.0000 mg | Freq: Once | INTRAMUSCULAR | Status: AC
  Administered 2023-10-09: 80 mg via INTRAVENOUS
  Filled 2023-10-09: qty 8

## 2023-10-09 MED ORDER — GADOBUTROL 1 MMOL/ML IV SOLN
7.0000 mL | Freq: Once | INTRAVENOUS | Status: AC | PRN
  Administered 2023-10-09: 7 mL via INTRAVENOUS

## 2023-10-09 MED ORDER — POTASSIUM CHLORIDE CRYS ER 20 MEQ PO TBCR
40.0000 meq | EXTENDED_RELEASE_TABLET | Freq: Once | ORAL | Status: AC
  Administered 2023-10-09: 40 meq via ORAL
  Filled 2023-10-09: qty 2

## 2023-10-09 MED ORDER — FUROSEMIDE 10 MG/ML IJ SOLN: 80.0000 mg | Freq: Two times a day (BID) | INTRAMUSCULAR | Status: DC

## 2023-10-09 MED ORDER — HYDROMORPHONE HCL 1 MG/ML IJ SOLN
0.5000 mg | Freq: Once | INTRAMUSCULAR | Status: AC
  Administered 2023-10-09: 0.5 mg via INTRAVENOUS
  Filled 2023-10-09: qty 1

## 2023-10-09 MED ORDER — OXYCODONE HCL 5 MG PO TABS
10.0000 mg | ORAL_TABLET | Freq: Four times a day (QID) | ORAL | Status: AC | PRN
  Administered 2023-10-09 – 2023-10-10 (×4): 10 mg via ORAL
  Filled 2023-10-09 (×5): qty 2

## 2023-10-09 MED ORDER — HEPARIN BOLUS VIA INFUSION
2000.0000 [IU] | Freq: Once | INTRAVENOUS | Status: AC
  Administered 2023-10-09: 2000 [IU] via INTRAVENOUS
  Filled 2023-10-09: qty 2000

## 2023-10-09 MED ORDER — DIGOXIN 125 MCG PO TABS
0.1250 mg | ORAL_TABLET | Freq: Every day | ORAL | Status: DC
  Administered 2023-10-09 – 2023-10-12 (×4): 0.125 mg via ORAL
  Filled 2023-10-09 (×4): qty 1

## 2023-10-09 MED ORDER — SPIRONOLACTONE 12.5 MG HALF TABLET
12.5000 mg | ORAL_TABLET | Freq: Every day | ORAL | Status: DC
  Administered 2023-10-09 – 2023-10-10 (×2): 12.5 mg via ORAL
  Filled 2023-10-09 (×4): qty 1

## 2023-10-09 NOTE — Progress Notes (Signed)
 Heart Failure Navigator Progress Note  Assessed for Heart & Vascular TOC clinic readiness.  Patient does not meet criteria due to Advanced Heart Failure Team consulted. .   Navigator will sign off at this time.   Rhae Hammock, BSN, Scientist, clinical (histocompatibility and immunogenetics) Only

## 2023-10-09 NOTE — Progress Notes (Signed)
 PHARMACY - ANTICOAGULATION CONSULT NOTE  Pharmacy Consult for Heparin Indication: LV thrombus  Allergies  Allergen Reactions   Amoxicillin Swelling   Codeine Itching   Penicillins     Itching Did it involve swelling of the face/tongue/throat, SOB, or low BP? SOB & Swelling Which Caused Blood Clots Did it involve sudden or severe rash/hives, skin peeling, or any reaction on the inside of your mouth or nose? No Did you need to seek medical attention at a hospital or doctor's office? Y When did it last happen?  Over month ago     If all above answers are "NO", may proceed with cephalosporin use.     Ketorolac Itching and Other (See Comments)    nightmares    Patient Measurements: Height: 6' (182.9 cm) Weight: 72.6 kg (160 lb) IBW/kg (Calculated) : 77.6 Heparin Dosing Weight: 72.6 kg  Vital Signs: Temp: 97.9 F (36.6 C) (03/21 0336) Temp Source: Oral (03/21 0336) BP: 95/71 (03/21 0336) Pulse Rate: 97 (03/21 0336)  Labs: Recent Labs    10/07/23 1843 10/07/23 1848 10/07/23 1935 10/08/23 0431 10/08/23 1833 10/08/23 2038 10/09/23 0438  HGB 8.9* 10.2*  --  8.0* 8.6*  --  8.5*  HCT 30.9* 30.0*  --  27.5* 29.3*  --  29.0*  PLT 399  --   --  327 307  --  357  APTT  --   --   --   --   --  34  --   LABPROT  --   --   --   --   --  17.3*  --   INR  --   --   --   --   --  1.4*  --   HEPARINUNFRC  --   --   --   --   --   --  <0.10*  CREATININE 1.94* 2.10*  --  1.63*  --   --   --   TROPONINIHS 18*  --  16  --   --   --   --     Estimated Creatinine Clearance: 63.1 mL/min (A) (by C-G formula based on SCr of 1.63 mg/dL (H)).   Medical History: Past Medical History:  Diagnosis Date   Beta-hemolytic group B streptococcal sepsis (HCC) 10/2019   at [facility]   Chronic systolic CHF (congestive heart failure) (HCC) 12/30/2019   Coronary artery disease involving native coronary artery of native heart without angina pectoris 12/30/2019   Essential hypertension 12/30/2019    Hypertension    Intravenous drug abuse (HCC) 12/30/2019   Ischemic cardiomyopathy 12/30/2019   Mixed hyperlipidemia 12/30/2019   Nicotine dependence, cigarettes, uncomplicated 12/30/2019    Medications:  Scheduled:   Chlorhexidine Gluconate Cloth  6 each Topical Daily   mupirocin ointment  1 Application Nasal BID   Infusions:   cefTRIAXone (ROCEPHIN)  IV Stopped (10/08/23 1133)   heparin 1,200 Units/hr (10/08/23 2037)   metronidazole 500 mg (10/08/23 2201)   sodium chloride 0.9 % 1,000 mL with potassium chloride 40 mEq infusion 100 mL/hr at 10/09/23 0254   PRN: melatonin, polyethylene glycol, prochlorperazine  Assessment: 38 yo male admitted with chest pain. Echo obtained which cannot rule out LV thrombus. Pharmacy consulted to dose IV heparin. 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  3/21 AM update:  Heparin level sub-therapeutic   Goal of Therapy:  Heparin level 0.3-0.7 units/ml Monitor platelets by anticoagulation protocol: Yes   Plan:  Heparin 2000 units bolus  Inc heparin to 1400 units/hr Heparin level in 6-8 hours  [name], PharmD, BCPS Clinical Pharmacist Phone: [phone]

## 2023-10-09 NOTE — Progress Notes (Signed)
 PHARMACY - ANTICOAGULATION CONSULT NOTE  Pharmacy Consult for Heparin Indication: LV thrombus  Allergies  Allergen Reactions   Amoxicillin Swelling   Codeine Itching   Penicillins     Itching Did it involve swelling of the face/tongue/throat, SOB, or low BP? SOB & Swelling Which Caused Blood Clots Did it involve sudden or severe rash/hives, skin peeling, or any reaction on the inside of your mouth or nose? No Did you need to seek medical attention at a hospital or doctor's office? Y When did it last happen?  Over month ago     If all above answers are "NO", may proceed with cephalosporin use.     Ketorolac Itching and Other (See Comments)    nightmares    Patient Measurements: Height: 6' (182.9 cm) Weight: 72.6 kg (160 lb) IBW/kg (Calculated) : 77.6 Heparin Dosing Weight: 72.6 kg  Vital Signs: Temp: 97.8 F (36.6 C) (03/21 1958) Temp Source: Oral (03/21 1958) BP: 123/93 (03/21 1958) Pulse Rate: 92 (03/21 1958)  Labs: Recent Labs    10/07/23 1843 10/07/23 1848 10/07/23 1935 10/08/23 0431 10/08/23 1833 10/08/23 2038 10/09/23 0438 10/09/23 1238 10/09/23 2102  HGB 8.9* 10.2*  --  8.0* 8.6*  --  8.5*  --   --   HCT 30.9* 30.0*  --  27.5* 29.3*  --  29.0*  --   --   PLT 399  --   --  327 307  --  357  --   --   APTT  --   --   --   --   --  34  --   --   --   LABPROT  --   --   --   --   --  17.3*  --   --   --   INR  --   --   --   --   --  1.4*  --   --   --   HEPARINUNFRC  --   --   --   --   --   --  <0.10* <0.10* <0.10*  CREATININE 1.94* 2.10*  --  1.63*  --   --  1.49*  --   --   TROPONINIHS 18*  --  16  --   --   --   --   --   --     Estimated Creatinine Clearance: 69 mL/min (A) (by C-G formula based on SCr of 1.49 mg/dL (H)).   Medical History: Past Medical History:  Diagnosis Date   Beta-hemolytic group B streptococcal sepsis (HCC) 10/2019   at [facility]   Chronic systolic CHF (congestive heart failure) (HCC) 12/30/2019   Coronary artery disease  involving native coronary artery of native heart without angina pectoris 12/30/2019   Essential hypertension 12/30/2019   Hypertension    Intravenous drug abuse (HCC) 12/30/2019   Ischemic cardiomyopathy 12/30/2019   Mixed hyperlipidemia 12/30/2019   Nicotine dependence, cigarettes, uncomplicated 12/30/2019    Medications:  Scheduled:   Chlorhexidine Gluconate Cloth  6 each Topical Daily   digoxin  0.125 mg Oral Daily   furosemide  80 mg Intravenous Once   mupirocin ointment  1 Application Nasal BID   spironolactone  12.5 mg Oral Daily   Infusions:   cefTRIAXone (ROCEPHIN)  IV 2 g (10/09/23 0941)   heparin 1,500 Units/hr (10/09/23 1418)   metronidazole 500 mg (10/09/23 0940)   PRN: melatonin, oxyCODONE, polyethylene glycol, prochlorperazine  Assessment: 38 yo male admitted with  chest pain. Echo obtained which cannot rule out LV thrombus. Pharmacy consulted to dose IV heparin. 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  Heparin level remains <0.1 again, no infusion issues. Pt noted to have some mild rectal bleeding with known Crohns and acute colitis on admit, hemeoccult positive. H/H ok. Will increase heparin cautiously for now and target lower end of range.   Goal of Therapy:  Heparin level 0.3-0.5 units/ml Monitor platelets by anticoagulation protocol: Yes   Plan:  Increase heparin to 1650 units/h - titrate slowly for now Repeat heparin level in 6h  Loralee Pacas, PharmD, BCPS Clinical Pharmacist [phone] Please check AMION for all [facility] numbers 10/09/2023

## 2023-10-09 NOTE — Plan of Care (Signed)
  Problem: Education: Goal: Knowledge of General Education information will improve Description: Including pain rating scale, medication(s)/side effects and non-pharmacologic comfort measures Outcome: Progressing   Problem: Health Behavior/Discharge Planning: Goal: Ability to manage health-related needs will improve Outcome: Progressing   Problem: Clinical Measurements: Goal: Ability to maintain clinical measurements within normal limits will improve Outcome: Progressing Goal: Will remain free from infection Outcome: Progressing Goal: Cardiovascular complication will be avoided Outcome: Progressing   Problem: Activity: Goal: Risk for activity intolerance will decrease Outcome: Progressing   Problem: Nutrition: Goal: Adequate nutrition will be maintained Outcome: Progressing   Problem: Coping: Goal: Level of anxiety will decrease Outcome: Progressing   Problem: Pain Managment: Goal: General experience of comfort will improve and/or be controlled Outcome: Progressing   Problem: Safety: Goal: Ability to remain free from injury will improve Outcome: Progressing

## 2023-10-09 NOTE — Progress Notes (Signed)
  Device system confirmed  ,, to be MRI conditional, with implant date > 6 weeks ago, and no evidence of abandoned or epicardial leads in review of most recent CXR  Device last cleared by EP Provider:  Otilio Saber (Cardiology PA)  Clearance is good through for 1 year as long as parameters remain stable at time of check. If pt undergoes a cardiac device procedure during that time, they should be re-cleared.   Tachy-therapies to be programmed off if applicable with device back to pre-MRI settings after completion of exam.  [employer] - Programming recommendation received through [employer] App/Tablet  Rayvon Char, RT  10/09/2023 3:24 PM

## 2023-10-09 NOTE — TOC Initial Note (Signed)
 Transition of Care [facility]) - Initial/Assessment Note    Patient Details  Name: Shaun [name] MRN: [id number] Date of Birth: 1986-07-20  Transition of Care [facility]) CM/SW Contact:    Nicanor Bake Phone Number: [phone] 10/09/2023, 2:39 PM  Clinical Narrative:     [facility] CSW met with pt at bedside. Pt stated that he lives with his brother. Pt stated that he does not work, and recently got out of prison. Pt stated that he has no history of HH services. Pt stated that he only has a defibrillator. Pt stated that he has a scale at home. Pt stated that he has a PCP. CSW explained that a hospital follow up appointment will be scheduled closer towards dc. Pt agrees.      TOC will continue following.            Expected Discharge Plan: Home/Self Care Barriers to Discharge: Continued Medical Work up   Patient Goals and CMS Choice Patient states their goals for this hospitalization and ongoing recovery are:: get healthy          Expected Discharge Plan and Services       Living arrangements for the past 2 months: Single Family Home                                      Prior Living Arrangements/Services Living arrangements for the past 2 months: Single Family Home Lives with:: Siblings Patient language and need for interpreter reviewed:: Yes Do you feel safe going back to the place where you live?: Yes      Need for Family Participation in Patient Care: No (Comment) Care giver support system in place?: Yes (comment)   Criminal Activity/Legal Involvement Pertinent to Current Situation/Hospitalization: No - Comment as needed  Activities of Daily Living   ADL Screening (condition at time of admission) Independently performs ADLs?: Yes (appropriate for developmental age) Is the patient deaf or have difficulty hearing?: No Does the patient have difficulty seeing, even when wearing glasses/contacts?: No Does the patient have difficulty concentrating, remembering, or  making decisions?: No  Permission Sought/Granted                  Emotional Assessment Appearance:: Appears stated age Attitude/Demeanor/Rapport: Engaged Affect (typically observed): Appropriate Orientation: : Oriented to Self, Oriented to Place, Oriented to  Time, Oriented to Situation Alcohol / Substance Use: Not Applicable Psych Involvement: No (comment)  Admission diagnosis:  AKI (acute kidney injury) (HCC) [N17.9] Chest pain [R07.9] Patient Active Problem List   Diagnosis Date Noted   AKI (acute kidney injury) (HCC) 10/08/2023   Chest pain 10/07/2023   History of intravenous drug abuse    Lung field abnormal finding on examination    Coronary artery disease involving native coronary artery of native heart without angina pectoris 12/30/2019   Essential hypertension 12/30/2019   Ischemic cardiomyopathy 12/30/2019   Mixed hyperlipidemia 12/30/2019   Cellulitis of left upper extremity 12/30/2019   Acute deep vein thrombosis (DVT) of left upper extremity (HCC) 12/30/2019   Nicotine dependence, cigarettes, uncomplicated 12/30/2019   Intravenous drug abuse (HCC) 12/30/2019   Chronic systolic CHF (congestive heart failure) (HCC) 12/30/2019   PCP:  Maye Hides, PA Pharmacy:   [id number] [place], [place] - [address] AT [facility] [address] [place] [address] Phone: [phone] Fax: [phone]  Patrcia Dolly  [facility] [address] [place] [address] Phone: [phone] Fax: [phone]     Social Drivers of Health (SDOH) Social History: SDOH Screenings   Food Insecurity: No Food Insecurity (10/08/2023)  Housing: Low Risk  (10/08/2023)  Transportation Needs: No Transportation Needs (10/08/2023)  Utilities: Not At Risk (10/08/2023)  Financial Resource Strain: High Risk (05/19/2023)   [facility]  Social Connections: Unknown (07/02/2022)   Received from [facility]  Tobacco Use:  High Risk (10/07/2023)   SDOH Interventions:     Readmission Risk Interventions     No data to display

## 2023-10-09 NOTE — Progress Notes (Addendum)
 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 Troponins were very slightly elevated and flat, not indicative of ACS, ACS ruled out.  Despite of having elevated BNP and not knowing baseline, he appeared euvolemic, has a history of chronic heart failure with reduced ejection fraction.  Also has a history of chron's disease but CT scan at this time shows left sided colitis and he does complain of abdominal pain on the left side and is tender to palpation as well.  Patient was started on Rocephin and Flagyl.  Assessment & Plan:   Principal Problem:   Chest pain Active Problems:   AKI (acute kidney injury) (HCC)  History of Crohn's disease presented with abdominal pain/nausea/vomiting/acute colitis: Symptoms are improving.  No reports of diarrhea, in fact no bowel movement.  Continue Rocephin and Flagyl.  Oxycodone as needed.  Will not give any IV pain medications.  Chest pain: ACS ruled out, troponins x 2 negative.  CT angio negative for PE.  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  Not on any GDMT medications.  Repeat echo this time shows EF <20% with global hypokinesis and possibly left ventricular thrombus.  Started on heparin drip right away which I will continue.  I have consulted cardiology.  Substance abuse/IV drug abuse/history of MSSA bacteremia and October 2024: Endorses self injected Suboxone while in prison.  UDS appears to have been collected but results pending.  Blood culture  are growing staph epi dermatitis, likely contamination.  However also mecA resistance detected, suspicious for possible MRSA infection.  Will start on vancomycin until final cultures are reported. Addendum: Discussed with pharmacy, he is growing staph epidermidis and only one of the 4 bottles, which is likely contamination so we will discontinue antibiotics.  Severe iron deficiency anemia: Hemoglobin at baseline appears to be 10 but dropped to 8 which improved to 8.5.  Iron studies indicate severe iron deficiency anemia.  Will need oral iron replacement at discharge.  Possibly some iron IV infusion while here but hold after that for now.  Monitor CBC daily.  FOBT pending as well.  Hypokalemia: Resolved.  Possible CKD stage IIIa: Last known creatinine is from August 2021 which was normal.  Presented with 1.94 this time which is improved to 1.5 today.  This appears to be his baseline.  DVT prophylaxis: Heparin   Code Status: Full Code  Family Communication:  None present at bedside.  Plan of care discussed with patient in length and he/she verbalized understanding and agreed with it.  Status is: Inpatient Remains inpatient appropriate because: Needs to be seen by cardiology   Estimated body mass index is 21.7 kg/m as calculated from the following:   Height as of this encounter: 6' (1.829 m).   Weight as of this encounter: 72.6 kg.    Nutritional Assessment: Body mass index is 21.7 kg/m.Marland Kitchen Seen by dietician.  I agree with the assessment and plan as outlined below: Nutrition Status:        . Skin Assessment: I have examined the  patient's skin and I agree with the wound assessment as performed by the wound care RN as outlined below:    Consultants:  Cardiology  Procedures:  None  Antimicrobials:  Anti-infectives (From admission, onward)    Start     Dose/Rate Route Frequency Ordered Stop   10/08/23 1100  cefTRIAXone (ROCEPHIN) 2 g in sodium chloride 0.9 % 100 mL IVPB         2 g 200 mL/hr over 30 Minutes Intravenous Daily 10/08/23 1046     10/08/23 1100  metroNIDAZOLE (FLAGYL) IVPB 500 mg        500 mg 100 mL/hr over 60 Minutes Intravenous 2 times daily 10/08/23 1046           Subjective: Patient seen and examined.  Feels better.  Abdominal pain is down to only 2-3 out of 10.  Still nauseous at times but no vomiting.  We placed him on liquid diet yesterday but he threatened to leave AGAINST MEDICAL ADVICE if he were not to advance to regular diet.  Diet was advanced to regular diet.  Discussed with him in detail again today.  Advised that we should back off on full liquid diet if he is not tolerating but he was insistent to continue regular diet and pick and choose the diet that suits him.  Also, I discussed with him and advised him against leaving AGAINST MEDICAL ADVICE since he is very sick and the consequences can be grave if he did so.  He verbalized understanding.  Objective: Vitals:   10/08/23 2230 10/08/23 2332 10/09/23 0259 10/09/23 0336  BP:  97/71  95/71  Pulse: 87 95 98 97  Resp:  18  20  Temp:  97.6 F (36.4 C)  97.9 F (36.6 C)  TempSrc:  Oral  Oral  SpO2: 98% 94% 100% 95%  Weight:      Height:        Intake/Output Summary (Last 24 hours) at 10/09/2023 0726 Last data filed at 10/09/2023 0644 Gross per 24 hour  Intake 2308.39 ml  Output 350 ml  Net 1958.39 ml   Filed Weights   10/07/23 1720  Weight: 72.6 kg    Examination:  General exam: Appears calm and comfortable  Respiratory system: Clear to auscultation. Respiratory effort normal. Cardiovascular system: S1 & S2 heard, RRR. No JVD, murmurs, rubs, gallops or clicks. No pedal edema. Gastrointestinal system: Abdomen is nondistended, soft and very minimal left lower quadrant tenderness. No organomegaly or masses felt. Normal bowel sounds heard. Central nervous system: Alert and oriented. No focal neurological deficits. Extremities: Symmetric 5 x 5 power. Skin: No rashes, lesions  or ulcers.    Data Reviewed: I have personally reviewed following labs and imaging studies  CBC: Recent Labs  Lab 10/07/23 1843 10/07/23 1848 10/08/23 0431 10/08/23 1833 10/09/23 0438  WBC 6.7  --  5.2 5.1 4.9  NEUTROABS  --   --   --  3.0 2.9  HGB 8.9* 10.2* 8.0* 8.6* 8.5*  HCT 30.9* 30.0* 27.5* 29.3* 29.0*  MCV 68.7*  --  68.8* 67.8* 66.8*  PLT 399  --  327 307 357   Basic Metabolic Panel: Recent Labs  Lab 10/07/23 1843 10/07/23 1848 10/08/23 0431 10/09/23 0438  NA 135 140 136 133*  K 4.4 4.5 3.2* 4.5  CL 103 106 101 106  CO2 21*  --  24 21*  GLUCOSE 124* 119* 96 103*  BUN 39* 36* 33* 32*  CREATININE 1.94* 2.10* 1.63* 1.49*  CALCIUM 8.5*  --  7.8* 7.7*  MG  --   --  1.8  --   PHOS  --   --  4.0  --    GFR: Estimated Creatinine Clearance: 69 mL/min (A) (by C-G formula based on SCr of 1.49 mg/dL (H)). Liver Function Tests: Recent Labs  Lab 10/07/23 1843 10/08/23 0431 10/09/23 0438  AST 26 21 29   ALT 14 12 15   ALKPHOS 72 68 94  BILITOT 1.5* 0.9 1.0  PROT 7.6 6.3* 6.5  ALBUMIN 3.3* 2.7* 2.7*   No results for input(s): "LIPASE", "AMYLASE" in the last 168 hours. No results for input(s): "AMMONIA" in the last 168 hours. Coagulation Profile: Recent Labs  Lab 10/08/23 2038  INR 1.4*   Cardiac Enzymes: No results for input(s): "CKTOTAL", "CKMB", "CKMBINDEX", "TROPONINI" in the last 168 hours. BNP (last 3 results) No results for input(s): "PROBNP" in the last 8760 hours. HbA1C: No results for input(s): "HGBA1C" in the last 72 hours. CBG: No results for input(s): "GLUCAP" in the last 168 hours. Lipid Profile: No results for input(s): "CHOL", "HDL", "LDLCALC", "TRIG", "CHOLHDL", "LDLDIRECT" in the last 72 hours. Thyroid Function Tests: No results for input(s): "TSH", "T4TOTAL", "FREET4", "T3FREE", "THYROIDAB" in the last 72 hours. Anemia Panel: Recent Labs    10/08/23 1531  VITAMINB12 674  FOLATE 12.0  FERRITIN 8*  TIBC 455*  IRON 19*  RETICCTPCT  1.7   Sepsis Labs: No results for input(s): "PROCALCITON", "LATICACIDVEN" in the last 168 hours.  Recent Results (from the past 240 hours)  Resp panel by RT-PCR (RSV, Flu A&B, Covid) Anterior Nasal Swab     Status: None   Collection Time: 10/07/23  7:33 PM   Specimen: Anterior Nasal Swab  Result Value Ref Range Status   SARS Coronavirus 2 by RT PCR NEGATIVE NEGATIVE Final   Influenza A by PCR NEGATIVE NEGATIVE Final   Influenza B by PCR NEGATIVE NEGATIVE Final    Comment: (NOTE) The Xpert Xpress SARS-CoV-2/FLU/RSV plus assay is intended as an aid in the diagnosis of influenza from Nasopharyngeal swab specimens and should not be used as a sole basis for treatment. Nasal washings and aspirates are unacceptable for Xpert Xpress SARS-CoV-2/FLU/RSV testing.  Fact Sheet for Patients: BloggerCourse.com  Fact Sheet for Healthcare Providers: SeriousBroker.it  This test is not yet approved or cleared by the [place] FDA and has been authorized for detection and/or diagnosis of SARS-CoV-2 by FDA under an Emergency Use Authorization (EUA). This EUA will remain in effect (meaning this test can be used) for the duration of the COVID-19 declaration under Section 564(b)(1) of the Act, 21 U.S.C. section 360bbb-3(b)(1), unless the authorization is terminated or revoked.     Resp Syncytial Virus by PCR NEGATIVE NEGATIVE Final    Comment: (NOTE) Fact Sheet for Patients: BloggerCourse.com  Fact Sheet for Healthcare Providers: SeriousBroker.it  This test is not yet approved or cleared by the [place] FDA and has been authorized for detection and/or diagnosis of SARS-CoV-2 by FDA under an Emergency Use Authorization (EUA). This EUA will remain in effect (meaning this test can be used) for the duration of the COVID-19 declaration under Section 564(b)(1) of the Act, 21 U.S.C. section  360bbb-3(b)(1), unless the authorization is terminated or revoked.  Performed at [facility], [address], [place], [place] [address]   Culture, blood (Routine X 2) w Reflex to ID Panel     Status: None (Preliminary result)   Collection Time: 10/08/23  4:31 AM  Specimen: BLOOD RIGHT ARM  Result Value Ref Range Status   Specimen Description BLOOD RIGHT ARM  Final   Special Requests   Final    BOTTLES DRAWN AEROBIC AND ANAEROBIC Blood Culture results may not be optimal due to an inadequate volume of blood received in culture bottles   Culture   Final    NO GROWTH 1 DAY Performed at [facility], [address], [place], [place] [address]    Report Status PENDING  Incomplete  Culture, blood (Routine X 2) w Reflex to ID Panel     Status: None (Preliminary result)   Collection Time: 10/08/23  4:35 AM   Specimen: BLOOD LEFT ARM  Result Value Ref Range Status   Specimen Description BLOOD LEFT ARM  Final   Special Requests   Final    BOTTLES DRAWN AEROBIC AND ANAEROBIC Blood Culture results may not be optimal due to an inadequate volume of blood received in culture bottles   Culture  Setup Time   Final    GRAM POSITIVE COCCI IN CLUSTERS AEROBIC BOTTLE ONLY CRITICAL RESULT CALLED TO, READ BACK BY AND VERIFIED WITH: PHARMD J LEDFORD 10/09/2023 @ 0539 BY AB Performed at [facility], [address] [place], [place] [address]    Culture GRAM POSITIVE COCCI  Final   Report Status PENDING  Incomplete  Blood Culture ID Panel (Reflexed)     Status: Abnormal   Collection Time: 10/08/23  4:35 AM  Result Value Ref Range Status   Enterococcus faecalis NOT DETECTED NOT DETECTED Final   Enterococcus Faecium NOT DETECTED NOT DETECTED Final   Listeria monocytogenes NOT DETECTED NOT DETECTED Final   Staphylococcus species DETECTED (A) NOT DETECTED Final    Comment: CRITICAL RESULT CALLED TO, READ BACK BY AND VERIFIED WITH: PHARMD J LEDFORD 10/09/2023 @ 0539 BY AB     Staphylococcus aureus (BCID) NOT DETECTED NOT DETECTED Final   Staphylococcus epidermidis DETECTED (A) NOT DETECTED Final    Comment: Methicillin (oxacillin) resistant coagulase negative staphylococcus. Possible blood culture contaminant (unless isolated from more than one blood culture draw or clinical case suggests pathogenicity). No antibiotic treatment is indicated for blood  culture contaminants. CRITICAL RESULT CALLED TO, READ BACK BY AND VERIFIED WITH: PHARMD J LEDFORD 10/09/2023 @ 0539 BY AB    Staphylococcus lugdunensis NOT DETECTED NOT DETECTED Final   Streptococcus species NOT DETECTED NOT DETECTED Final   Streptococcus agalactiae NOT DETECTED NOT DETECTED Final   Streptococcus pneumoniae NOT DETECTED NOT DETECTED Final   Streptococcus pyogenes NOT DETECTED NOT DETECTED Final   A.calcoaceticus-baumannii NOT DETECTED NOT DETECTED Final   Bacteroides fragilis NOT DETECTED NOT DETECTED Final   Enterobacterales NOT DETECTED NOT DETECTED Final   Enterobacter cloacae complex NOT DETECTED NOT DETECTED Final   Escherichia coli NOT DETECTED NOT DETECTED Final   Klebsiella aerogenes NOT DETECTED NOT DETECTED Final   Klebsiella oxytoca NOT DETECTED NOT DETECTED Final   Klebsiella pneumoniae NOT DETECTED NOT DETECTED Final   Proteus species NOT DETECTED NOT DETECTED Final   Salmonella species NOT DETECTED NOT DETECTED Final   Serratia marcescens NOT DETECTED NOT DETECTED Final   Haemophilus influenzae NOT DETECTED NOT DETECTED Final   Neisseria meningitidis NOT DETECTED NOT DETECTED Final   Pseudomonas aeruginosa NOT DETECTED NOT DETECTED Final   Stenotrophomonas maltophilia NOT DETECTED NOT DETECTED Final   Candida albicans NOT DETECTED NOT DETECTED Final   Candida auris NOT DETECTED NOT DETECTED Final   Candida glabrata NOT DETECTED NOT  DETECTED Final   Candida krusei NOT DETECTED NOT DETECTED Final   Candida parapsilosis NOT DETECTED NOT DETECTED Final   Candida tropicalis NOT  DETECTED NOT DETECTED Final   Cryptococcus neoformans/gattii NOT DETECTED NOT DETECTED Final   Methicillin resistance mecA/C DETECTED (A) NOT DETECTED Final    Comment: CRITICAL RESULT CALLED TO, READ BACK BY AND VERIFIED WITH: PHARMD J LEDFORD 10/09/2023 @ 0539 BY AB Performed at [facility], [address], [place], [place] [address]   MRSA Next Gen by PCR, Nasal     Status: Abnormal   Collection Time: 10/08/23  7:46 PM   Specimen: Nasal Mucosa; Nasal Swab  Result Value Ref Range Status   MRSA by PCR Next Gen DETECTED (A) NOT DETECTED Final    Comment: RESULT CALLED TO, READ BACK BY AND VERIFIED WITH: RN Hurley Cisco [phone] @ 2133 FH (NOTE) The GeneXpert MRSA Assay (FDA approved for NASAL specimens only), is one component of a comprehensive MRSA colonization surveillance program. It is not intended to diagnose MRSA infection nor to guide or monitor treatment for MRSA infections. Test performance is not FDA approved in patients less than 90 years old. Performed at [facility], [address], [place], [place] [address]      Radiology Studies: ECHOCARDIOGRAM COMPLETE Result Date: 10/08/2023    ECHOCARDIOGRAM REPORT   Patient Name:   ANVAY TENNIS Granquist Date of Exam: 10/08/2023 Medical Rec #:  [id number]        Height:       72.0 in Accession #:    [id number]       Weight:       160.0 lb Date of Birth:  [birth year]        BSA:          1.938 m Patient Age:    37 years         BP:           120/90 mmHg Patient Gender: M                HR:           90 bpm. Exam Location:  Inpatient Procedure: 2D Echo, Cardiac Doppler, Color Doppler and Intracardiac            Opacification Agent (Both Spectral and Color Flow Doppler were            utilized during procedure). Indications:    Chest Pain  History:        Patient has no prior history of Echocardiogram examinations.                 Cardiomyopathy, CAD; Risk Factors:Hypertension.  Sonographer:    Melton Krebs RDCS, FE, PE Referring Phys:  [id number] CAROLE N HALL IMPRESSIONS  1. Cannot rule out a left ventricular thromubs at the apex (image 82). Consider cMRI for further evaluation. Clincal correlation required. Left ventricular ejection fraction, by estimation, is <20%. The left ventricle has severely decreased function. The left ventricle demonstrates global hypokinesis. The left ventricular internal cavity size was severely dilated. Left ventricular diastolic parameters are consistent with Grade III diastolic dysfunction (restrictive).  2. Right ventricular systolic function is mildly reduced. The right ventricular size is normal.  3. Left atrial size was moderately dilated.  4. Right atrial size was moderately dilated.  5. The mitral valve is grossly normal. Severe mitral valve regurgitation. No evidence of mitral stenosis.  6. The aortic valve is normal in structure. Aortic  valve regurgitation is not visualized. No aortic stenosis is present. Comparison(s): A prior study was performed on 12/31/2019. Mild MR is now severe. Cannot rule out a left ventricular thromubs at the apex. Consider cMRI for further evaluation. Clincal correlation required. Conclusion(s)/Recommendation(s): Findings reported to Dr. Hughie Closs via secure chat (around 7:41pm) and he acknowledges the pertinent findings. Recommend cardiology consult. FINDINGS  Left Ventricle: Cannot rule out a left ventricular thromubs at the apex (image 82). Consider cMRI for further evaluation. Clincal correlation required. Left ventricular ejection fraction, by estimation, is <20%. The left ventricle has severely decreased  function. The left ventricle demonstrates global hypokinesis. The left ventricular internal cavity size was severely dilated. There is no left ventricular hypertrophy. Left ventricular diastolic parameters are consistent with Grade III diastolic dysfunction (restrictive). Right Ventricle: The right ventricular size is normal. No increase in right ventricular wall thickness.  Right ventricular systolic function is mildly reduced. Left Atrium: Left atrial size was moderately dilated. Right Atrium: Right atrial size was moderately dilated. Pericardium: There is no evidence of pericardial effusion. Mitral Valve: The mitral valve is grossly normal. Severe mitral valve regurgitation, with centrally-directed jet. No evidence of mitral valve stenosis. Tricuspid Valve: The tricuspid valve is grossly normal. Tricuspid valve regurgitation is mild . No evidence of tricuspid stenosis. Aortic Valve: The aortic valve is normal in structure. Aortic valve regurgitation is not visualized. No aortic stenosis is present. Aortic valve mean gradient measures 2.0 mmHg. Aortic valve peak gradient measures 3.4 mmHg. Aortic valve area, by VTI measures 3.05 cm. Pulmonic Valve: The pulmonic valve was normal in structure. Pulmonic valve regurgitation is mild. No evidence of pulmonic stenosis. Aorta: The aortic root and ascending aorta are structurally normal, with no evidence of dilitation. IAS/Shunts: The atrial septum is grossly normal. Additional Comments: A device lead is visualized.  LEFT VENTRICLE PLAX 2D LVIDd:         7.40 cm   Diastology LVIDs:         6.70 cm   LV e' medial:    6.85 cm/s LV PW:         1.20 cm   LV E/e' medial:  15.8 LV IVS:        0.80 cm   LV e' lateral:   12.30 cm/s LVOT diam:     2.20 cm   LV E/e' lateral: 8.8 LV SV:         51 LV SV Index:   26 LVOT Area:     3.80 cm  RIGHT VENTRICLE RV S prime:     7.62 cm/s TAPSE (M-mode): 1.5 cm LEFT ATRIUM             Index        RIGHT ATRIUM           Index LA diam:        4.90 cm 2.53 cm/m   RA Area:     23.40 cm LA Vol (A2C):   98.3 ml 50.73 ml/m  RA Volume:   77.70 ml  40.10 ml/m LA Vol (A4C):   66.2 ml 34.16 ml/m LA Biplane Vol: 81.9 ml 42.26 ml/m  AORTIC VALVE AV Area (Vmax):    3.31 cm AV Area (Vmean):   3.19 cm AV Area (VTI):     3.05 cm AV Vmax:           91.70 cm/s AV Vmean:          61.400 cm/s AV VTI:  0.168 m AV  Peak Grad:      3.4 mmHg AV Mean Grad:      2.0 mmHg LVOT Vmax:         79.80 cm/s LVOT Vmean:        51.600 cm/s LVOT VTI:          0.135 m LVOT/AV VTI ratio: 0.80  AORTA Ao Root diam: 3.50 cm Ao Asc diam:  2.90 cm MITRAL VALVE                TRICUSPID VALVE MV Area (PHT): 4.36 cm     TR Peak grad:   20.1 mmHg MV Decel Time: 174 msec     TR Vmax:        224.00 cm/s MV E velocity: 108.00 cm/s MV A velocity: 47.50 cm/s   SHUNTS MV E/A ratio:  2.27         Systemic VTI:  0.14 m                             Systemic Diam: 2.20 cm Sunit Tolia Electronically signed by Tessa Lerner Signature Date/Time: 10/08/2023/8:04:07 PM    Final    CT ABDOMEN PELVIS W CONTRAST Result Date: 10/07/2023 CLINICAL DATA:  Crohn's exacerbation. EXAM: CT ABDOMEN AND PELVIS WITH CONTRAST TECHNIQUE: Multidetector CT imaging of the abdomen and pelvis was performed using the standard protocol following bolus administration of intravenous contrast. RADIATION DOSE REDUCTION: This exam was performed according to the departmental dose-optimization program which includes automated exposure control, adjustment of the mA and/or kV according to patient size and/or use of iterative reconstruction technique. CONTRAST:  65mL OMNIPAQUE IOHEXOL 350 MG/ML SOLN COMPARISON:  October 13, 2022 FINDINGS: Lower chest: No acute abnormality. Hepatobiliary: No focal liver abnormality is seen. No gallstones, gallbladder wall thickening, or biliary dilatation. Pancreas: Unremarkable. No pancreatic ductal dilatation or surrounding inflammatory changes. Spleen: Normal in size without focal abnormality. Adrenals/Urinary Tract: Adrenal glands are unremarkable. Kidneys are normal in size, without renal calculi or hydronephrosis. A 2.0 cm diameter simple cyst is seen within the lower pole of the left kidney. Bladder is unremarkable. Stomach/Bowel: Stomach is within normal limits. The appendix is not clearly identified. A large stool burden is noted throughout the colon. No  evidence of bowel dilatation. The descending colon is mildly thickened and inflamed. Mild thickening of a poorly distended sigmoid colon is also noted. Vascular/Lymphatic: No significant vascular findings are present. No enlarged abdominal or pelvic lymph nodes. Reproductive: Prostate is unremarkable. Other: No abdominal wall hernia or abnormality. No abdominopelvic ascites. Musculoskeletal: No acute or significant osseous findings. IMPRESSION: 1. Mild colitis involving the descending and sigmoid colon. 2. Large stool burden without evidence of bowel obstruction. 3. 2.0 cm diameter simple cyst within the lower pole of the left kidney. No follow-up imaging is recommended. This recommendation follows ACR consensus guidelines: Management of the Incidental Renal Mass on CT: A White Paper of the ACR Incidental Findings Committee. J Am Coll Radiol [phone]. Electronically Signed   By: Aram Candela M.D.   On: 10/07/2023 20:54   CT Angio Chest PE W and/or Wo Contrast Result Date: 10/07/2023 CLINICAL DATA:  Suspected pulmonary embolism. EXAM: CT ANGIOGRAPHY CHEST WITH CONTRAST TECHNIQUE: Multidetector CT imaging of the chest was performed using the standard protocol during bolus administration of intravenous contrast. Multiplanar CT image reconstructions and MIPs were obtained to evaluate the vascular anatomy. RADIATION DOSE REDUCTION: This exam was performed according to  the departmental dose-optimization program which includes automated exposure control, adjustment of the mA and/or kV according to patient size and/or use of iterative reconstruction technique. CONTRAST:  65mL OMNIPAQUE IOHEXOL 350 MG/ML SOLN COMPARISON:  None Available. FINDINGS: Cardiovascular: A dual lead AICD is noted. Satisfactory opacification of the pulmonary arteries to the segmental level. No evidence of pulmonary embolism. Normal heart size. No pericardial effusion. Mediastinum/Nodes: No enlarged mediastinal, hilar, or axillary lymph  nodes. Thyroid gland, trachea, and esophagus demonstrate no significant findings. Lungs/Pleura: Lungs are clear. No pleural effusion or pneumothorax. Upper Abdomen: No acute abnormality. Musculoskeletal: No chest wall abnormality. No acute or significant osseous findings. Review of the MIP images confirms the above findings. IMPRESSION: No evidence of pulmonary embolism or other acute intrathoracic process. Electronically Signed   By: Aram Candela M.D.   On: 10/07/2023 20:46    Scheduled Meds:  Chlorhexidine Gluconate Cloth  6 each Topical Daily   mupirocin ointment  1 Application Nasal BID   Continuous Infusions:  cefTRIAXone (ROCEPHIN)  IV Stopped (10/08/23 1133)   heparin 1,400 Units/hr (10/09/23 1610)   metronidazole Stopped (10/08/23 2303)     LOS: 1 day   Hughie Closs, MD [facility]  10/09/2023, 7:26 AM   *Please note that this is a verbal dictation therefore any spelling or grammatical errors are due to the "[employer]" system interpretation.  Please page via Amion and do not message via secure chat for urgent patient care matters. Secure chat can be used for non urgent patient care matters.  How to contact the [facility] Attending or Consulting provider 7A - 7P or covering provider during after hours 7P -7A, for this patient?  Check the care team in [facility] and look for a) attending/consulting [facility] provider listed and b) the [facility] team listed. Page or secure chat 7A-7P. Log into [email or web address] and use [facility]'s universal password to access. If you do not have the password, please contact the hospital operator. Locate the [facility] provider you are looking for under [facility] and page to a number that you can be directly reached. If you still have difficulty reaching the provider, please page the [facility] (Director on Call) for the Hospitalists listed on amion for assistance.

## 2023-10-09 NOTE — Progress Notes (Signed)
 PHARMACY - PHYSICIAN COMMUNICATION CRITICAL VALUE ALERT - BLOOD CULTURE IDENTIFICATION (BCID)  Shaun [name] is an 38 y.o. male who presented to [facility] on 10/07/2023 with a chief complaint of abdominal pain  Name of physician (or Provider) Contacted: Dr. Arlean Hopping  Current antibiotics: Ceftriaxone, Flagyl   Changes to prescribed antibiotics recommended:  No changes   Results for orders placed or performed during the hospital encounter of 10/07/23  Blood Culture ID Panel (Reflexed) (Collected: 10/08/2023  4:35 AM)  Result Value Ref Range   Enterococcus faecalis NOT DETECTED NOT DETECTED   Enterococcus Faecium NOT DETECTED NOT DETECTED   Listeria monocytogenes NOT DETECTED NOT DETECTED   Staphylococcus species DETECTED (A) NOT DETECTED   Staphylococcus aureus (BCID) NOT DETECTED NOT DETECTED   Staphylococcus epidermidis DETECTED (A) NOT DETECTED   Staphylococcus lugdunensis NOT DETECTED NOT DETECTED   Streptococcus species NOT DETECTED NOT DETECTED   Streptococcus agalactiae NOT DETECTED NOT DETECTED   Streptococcus pneumoniae NOT DETECTED NOT DETECTED   Streptococcus pyogenes NOT DETECTED NOT DETECTED   A.calcoaceticus-baumannii NOT DETECTED NOT DETECTED   Bacteroides fragilis NOT DETECTED NOT DETECTED   Enterobacterales NOT DETECTED NOT DETECTED   Enterobacter cloacae complex NOT DETECTED NOT DETECTED   Escherichia coli NOT DETECTED NOT DETECTED   Klebsiella aerogenes NOT DETECTED NOT DETECTED   Klebsiella oxytoca NOT DETECTED NOT DETECTED   Klebsiella pneumoniae NOT DETECTED NOT DETECTED   Proteus species NOT DETECTED NOT DETECTED   Salmonella species NOT DETECTED NOT DETECTED   Serratia marcescens NOT DETECTED NOT DETECTED   Haemophilus influenzae NOT DETECTED NOT DETECTED   Neisseria meningitidis NOT DETECTED NOT DETECTED   Pseudomonas aeruginosa NOT DETECTED NOT DETECTED   Stenotrophomonas maltophilia NOT DETECTED NOT DETECTED   Candida albicans NOT DETECTED NOT  DETECTED   Candida auris NOT DETECTED NOT DETECTED   Candida glabrata NOT DETECTED NOT DETECTED   Candida krusei NOT DETECTED NOT DETECTED   Candida parapsilosis NOT DETECTED NOT DETECTED   Candida tropicalis NOT DETECTED NOT DETECTED   Cryptococcus neoformans/gattii NOT DETECTED NOT DETECTED   Methicillin resistance mecA/C DETECTED (A) NOT DETECTED    [name] 10/09/2023  5:46 AM

## 2023-10-09 NOTE — Progress Notes (Signed)
 [facility] night cross cover note:   I was notified by RN that the patient is refusing his 2300 dose of IV Lasix.     Newton Pigg, DO Hospitalist

## 2023-10-09 NOTE — Progress Notes (Addendum)
 PHARMACY - ANTICOAGULATION CONSULT NOTE  Pharmacy Consult for Heparin Indication: LV thrombus  Allergies  Allergen Reactions   Amoxicillin Swelling   Codeine Itching   Penicillins     Itching Did it involve swelling of the face/tongue/throat, SOB, or low BP? SOB & Swelling Which Caused Blood Clots Did it involve sudden or severe rash/hives, skin peeling, or any reaction on the inside of your mouth or nose? No Did you need to seek medical attention at a hospital or doctor's office? Y When did it last happen?  Over month ago     If all above answers are "NO", may proceed with cephalosporin use.     Ketorolac Itching and Other (See Comments)    nightmares    Patient Measurements: Height: 6' (182.9 cm) Weight: 72.6 kg (160 lb) IBW/kg (Calculated) : 77.6 Heparin Dosing Weight: 72.6 kg  Vital Signs: Temp: 97.9 F (36.6 C) (03/21 0336) Temp Source: Oral (03/21 0336) BP: 99/88 (03/21 1328) Pulse Rate: 101 (03/21 1339)  Labs: Recent Labs    10/07/23 1843 10/07/23 1848 10/07/23 1935 10/08/23 0431 10/08/23 1833 10/08/23 2038 10/09/23 0438 10/09/23 1238  HGB 8.9* 10.2*  --  8.0* 8.6*  --  8.5*  --   HCT 30.9* 30.0*  --  27.5* 29.3*  --  29.0*  --   PLT 399  --   --  327 307  --  357  --   APTT  --   --   --   --   --  34  --   --   LABPROT  --   --   --   --   --  17.3*  --   --   INR  --   --   --   --   --  1.4*  --   --   HEPARINUNFRC  --   --   --   --   --   --  <0.10* <0.10*  CREATININE 1.94* 2.10*  --  1.63*  --   --  1.49*  --   TROPONINIHS 18*  --  16  --   --   --   --   --     Estimated Creatinine Clearance: 69 mL/min (A) (by C-G formula based on SCr of 1.49 mg/dL (H)).   Medical History: Past Medical History:  Diagnosis Date   Beta-hemolytic group B streptococcal sepsis (HCC) 10/2019   at [facility]   Chronic systolic CHF (congestive heart failure) (HCC) 12/30/2019   Coronary artery disease involving native coronary artery of native heart without  angina pectoris 12/30/2019   Essential hypertension 12/30/2019   Hypertension    Intravenous drug abuse (HCC) 12/30/2019   Ischemic cardiomyopathy 12/30/2019   Mixed hyperlipidemia 12/30/2019   Nicotine dependence, cigarettes, uncomplicated 12/30/2019    Medications:  Scheduled:   Chlorhexidine Gluconate Cloth  6 each Topical Daily   digoxin  0.125 mg Oral Daily   furosemide  80 mg Intravenous Once   mupirocin ointment  1 Application Nasal BID   spironolactone  12.5 mg Oral Daily   Infusions:   cefTRIAXone (ROCEPHIN)  IV 2 g (10/09/23 0941)   heparin 1,400 Units/hr (10/09/23 1026)   metronidazole 500 mg (10/09/23 0940)   PRN: melatonin, oxyCODONE, polyethylene glycol, prochlorperazine  Assessment: 38 yo male admitted with chest pain. Echo obtained which cannot rule out LV thrombus. Pharmacy consulted to dose IV heparin. 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  Heparin level remains <0.1, no infusion issues. Pt noted to have some mild rectal bleeding with known Crohns and acute colitis on admit, hemeoccult pending. H/H ok. Will increase heparin cautiously for now and target lower end of range.   Goal of Therapy:  Heparin level 0.3-0.5 units/ml Monitor platelets by anticoagulation protocol: Yes   Plan:  Increase heparin to 1500 units/h - titrate slowly for now Repeat heparin level in 6h  Fredonia Highland, PharmD, [place], [facility] Clinical Pharmacist [phone] Please check AMION for all [facility] Pharmacy numbers 10/09/2023

## 2023-10-09 NOTE — Consult Note (Addendum)
 Advanced Heart Failure Team Consult Note   Primary Physician: Maye Hides, PA Cardiologist:  None  Reason for Consultation: Acute on chronic systolic CHF  HPI:    Shaun [name] is seen today for evaluation of chronic systolic CHF at the request of Dr. Jacqulyn Bath with [facility]. 38 y.o. male with history of CAD w/ prior MI in 2017, IV drug abuse, ischemic cardiomyopathy s/p ICD, chronic systolic CHF, HTN, Crohn's disease with perianal fistula, untreated chronic HepC, hx DVT/PE 10/24.   Had anterior STEMI c/b cardiac arrest in 2017 in setting of cocaine, heroin and tobacco use. He underwent PCI/DES to ostial/prox LAD and mid LAD as well as PTCA to D1. EF 25% at time of MI.   EF has remained reduced. Echo 09/24 with EF 15-20%. Later had VT and underwent ICD placement in 10/24.   Admitted to [facility] later in 10/24 with sepsis 2/2 MSSA bacteremia. Discharged back to prison with PICC line and completed course of IV antibiotics. Had TEE in 12/24 w/ no valvular or lead associated vegetations. LV function remained severely reduced.  Presented to the ED 10/07/23 with complaints of chest pain and worsening dyspnea after bumex had been discontinued. Also noted abdominal pain, N, V, night sweats and blood/mucous in stools. ICD interrogated, no shocks. CTA negative for PE. CT A/P w/ mild colitis involving descending and sigmoid colon and large stool burden. Admitted for chest pain workup and colitis. Given IVF and started  abx w/ rocephin and flagyl given known sick contact. Troponin elevated with flat trend. BNP 1800. Echo with EF < 20%, cannot rule out LV apical thrombus, RV mildly reduced, severe MR. Advanced Heart Failure asked to see to assist with managing CHF.  Just released from prison on Monday. Now living in [place]. Reports he did not use any drugs in prison other than suboxone.  Has been off Eliquis, Entresto, Coreg, Bumex, Aspirin and Crohn's medications a couple of months d/t  concern for side effects.   Home Medications Prior to Admission medications   Medication Sig Start Date End Date Taking? Authorizing Provider  aspirin EC 81 MG EC tablet Take 1 tablet (81 mg total) by mouth daily. Swallow whole. 01/08/20  Yes Rolly Salter, MD  carvedilol (COREG) 3.125 MG tablet Take 1 tablet (3.125 mg total) by mouth 2 (two) times daily with a meal. 01/08/20  Yes Rolly Salter, MD  levETIRAcetam (KEPPRA PO) Take 1 tablet by mouth daily. Patient not taking: Reported on 10/08/2023    [provider]    Past Medical History: Past Medical History:  Diagnosis Date   Beta-hemolytic group B streptococcal sepsis (HCC) 10/2019   at [facility]   Chronic systolic CHF (congestive heart failure) (HCC) 12/30/2019   Coronary artery disease involving native coronary artery of native heart without angina pectoris 12/30/2019   Essential hypertension 12/30/2019   Hypertension    Intravenous drug abuse (HCC) 12/30/2019   Ischemic cardiomyopathy 12/30/2019   Mixed hyperlipidemia 12/30/2019   Nicotine dependence, cigarettes, uncomplicated 12/30/2019    Past Surgical History: Past Surgical History:  Procedure Laterality Date   I & D EXTREMITY Left 01/03/2020   Procedure: IRRIGATION AND DEBRIDEMENT LEFT UPPER EXTREMITY;  Surgeon: Sheral Apley, MD;  Location: [facility];  Service: Orthopedics;  Laterality: Left;    Family History: Family History  Family history unknown: Yes    Social History: Social History   Socioeconomic History   Marital status: Single    Spouse name:  Not on file   Number of children: Not on file   Years of education: Not on file   Highest education level: Not on file  Occupational History   Not on file  Tobacco Use   Smoking status: Every Day    Current packs/day: 1.00    Types: Cigarettes   Smokeless tobacco: Never  Substance and Sexual Activity   Alcohol use: No   Drug use: Yes    Types: IV    Comment: opiates   Sexual activity: Not on  file  Other Topics Concern   Not on file  Social History Narrative   Not on file   Social Drivers of Health   Financial Resource Strain: High Risk (05/19/2023)   [facility]   Overall Financial Resource Strain (CARDIA)    Difficulty of Paying Living Expenses: Very hard  Food Insecurity: No Food Insecurity (10/08/2023)   Hunger Vital Sign    Worried About Running Out of Food in the Last Year: Never true    Ran Out of Food in the Last Year: Never true  Transportation Needs: No Transportation Needs (10/08/2023)   PRAPARE - Administrator, Civil Service (Medical): No    Lack of Transportation (Non-Medical): No  Physical Activity: Not on file  Stress: Not on file  Social Connections: Unknown (07/02/2022)   Received from [facility]   Social Network    Social Network: Not on file    Allergies:  Allergies  Allergen Reactions   Amoxicillin Swelling   Codeine Itching   Penicillins     Itching Did it involve swelling of the face/tongue/throat, SOB, or low BP? SOB & Swelling Which Caused Blood Clots Did it involve sudden or severe rash/hives, skin peeling, or any reaction on the inside of your mouth or nose? No Did you need to seek medical attention at a hospital or doctor's office? Y When did it last happen?  Over month ago     If all above answers are "NO", may proceed with cephalosporin use.     Ketorolac Itching and Other (See Comments)    nightmares    Objective:    Vital Signs:   Temp:  [97.6 F (36.4 C)-97.9 F (36.6 C)] 97.9 F (36.6 C) (03/21 0336) Pulse Rate:  [85-98] 91 (03/21 0710) Resp:  [16-21] 16 (03/21 0710) BP: (95-127)/(71-90) 108/76 (03/21 0710) SpO2:  [94 %-100 %] 95 % (03/21 0710) Last BM Date : 10/07/23  Weight change: Filed Weights   10/07/23 1720  Weight: 72.6 kg    Intake/Output:   Intake/Output Summary (Last 24 hours) at 10/09/2023 1130 Last data filed at 10/09/2023 0644 Gross per 24 hour  Intake 2308.39  ml  Output 350 ml  Net 1958.39 ml      Physical Exam    General:  Chronically ill appearing Neck: JVD elevated Cor: Regular rate & rhythm. No rubs, gallops or murmurs. Lungs: clear Abdomen: soft, nontender, nondistended.  Extremities: no edema Neuro: alert & orientedx3. Affect pleasant   Telemetry   ST 100s  EKG    None to review  Labs   Basic Metabolic Panel: Recent Labs  Lab 10/07/23 1843 10/07/23 1848 10/08/23 0431 10/09/23 0438  NA 135 140 136 133*  K 4.4 4.5 3.2* 4.5  CL 103 106 101 106  CO2 21*  --  24 21*  GLUCOSE 124* 119* 96 103*  BUN 39* 36* 33* 32*  CREATININE 1.94* 2.10* 1.63* 1.49*  CALCIUM 8.5*  --  7.8* 7.7*  MG  --   --  1.8  --   PHOS  --   --  4.0  --     Liver Function Tests: Recent Labs  Lab 10/07/23 1843 10/08/23 0431 10/09/23 0438  AST 26 21 29   ALT 14 12 15   ALKPHOS 72 68 94  BILITOT 1.5* 0.9 1.0  PROT 7.6 6.3* 6.5  ALBUMIN 3.3* 2.7* 2.7*   No results for input(s): "LIPASE", "AMYLASE" in the last 168 hours. No results for input(s): "AMMONIA" in the last 168 hours.  CBC: Recent Labs  Lab 10/07/23 1843 10/07/23 1848 10/08/23 0431 10/08/23 1833 10/09/23 0438  WBC 6.7  --  5.2 5.1 4.9  NEUTROABS  --   --   --  3.0 2.9  HGB 8.9* 10.2* 8.0* 8.6* 8.5*  HCT 30.9* 30.0* 27.5* 29.3* 29.0*  MCV 68.7*  --  68.8* 67.8* 66.8*  PLT 399  --  327 307 357    Cardiac Enzymes: No results for input(s): "CKTOTAL", "CKMB", "CKMBINDEX", "TROPONINI" in the last 168 hours.  BNP: BNP (last 3 results) Recent Labs    10/07/23 2312  BNP 1,830.7*    ProBNP (last 3 results) No results for input(s): "PROBNP" in the last 8760 hours.   CBG: No results for input(s): "GLUCAP" in the last 168 hours.  Coagulation Studies: Recent Labs    10/08/23 10-20-2036  LABPROT 17.3*  INR 1.4*     Imaging   ECHOCARDIOGRAM COMPLETE Result Date: 10/08/2023    ECHOCARDIOGRAM REPORT   Patient Name:   TOBEY SCHMELZLE Leidner Date of Exam: 10/08/2023 Medical  Rec #:  [id number]        Height:       72.0 in Accession #:    [id number]       Weight:       160.0 lb Date of Birth:  [birth year]        BSA:          1.938 m Patient Age:    37 years         BP:           120/90 mmHg Patient Gender: M                HR:           90 bpm. Exam Location:  Inpatient Procedure: 2D Echo, Cardiac Doppler, Color Doppler and Intracardiac            Opacification Agent (Both Spectral and Color Flow Doppler were            utilized during procedure). Indications:    Chest Pain  History:        Patient has no prior history of Echocardiogram examinations.                 Cardiomyopathy, CAD; Risk Factors:Hypertension.  Sonographer:    Melton Krebs RDCS, FE, PE Referring Phys: [id number] CAROLE N HALL IMPRESSIONS  1. Cannot rule out a left ventricular thromubs at the apex (image 82). Consider cMRI for further evaluation. Clincal correlation required. Left ventricular ejection fraction, by estimation, is <20%. The left ventricle has severely decreased function. The left ventricle demonstrates global hypokinesis. The left ventricular internal cavity size was severely dilated. Left ventricular diastolic parameters are consistent with Grade III diastolic dysfunction (restrictive).  2. Right ventricular systolic function is mildly reduced. The right ventricular size is normal.  3. Left atrial size was moderately dilated.  4. Right atrial size  was moderately dilated.  5. The mitral valve is grossly normal. Severe mitral valve regurgitation. No evidence of mitral stenosis.  6. The aortic valve is normal in structure. Aortic valve regurgitation is not visualized. No aortic stenosis is present. Comparison(s): A prior study was performed on 12/31/2019. Mild MR is now severe. Cannot rule out a left ventricular thromubs at the apex. Consider cMRI for further evaluation. Clincal correlation required. Conclusion(s)/Recommendation(s): Findings reported to Dr. Hughie Closs via secure chat (around 7:41pm) and he  acknowledges the pertinent findings. Recommend cardiology consult. FINDINGS  Left Ventricle: Cannot rule out a left ventricular thromubs at the apex (image 82). Consider cMRI for further evaluation. Clincal correlation required. Left ventricular ejection fraction, by estimation, is <20%. The left ventricle has severely decreased  function. The left ventricle demonstrates global hypokinesis. The left ventricular internal cavity size was severely dilated. There is no left ventricular hypertrophy. Left ventricular diastolic parameters are consistent with Grade III diastolic dysfunction (restrictive). Right Ventricle: The right ventricular size is normal. No increase in right ventricular wall thickness. Right ventricular systolic function is mildly reduced. Left Atrium: Left atrial size was moderately dilated. Right Atrium: Right atrial size was moderately dilated. Pericardium: There is no evidence of pericardial effusion. Mitral Valve: The mitral valve is grossly normal. Severe mitral valve regurgitation, with centrally-directed jet. No evidence of mitral valve stenosis. Tricuspid Valve: The tricuspid valve is grossly normal. Tricuspid valve regurgitation is mild . No evidence of tricuspid stenosis. Aortic Valve: The aortic valve is normal in structure. Aortic valve regurgitation is not visualized. No aortic stenosis is present. Aortic valve mean gradient measures 2.0 mmHg. Aortic valve peak gradient measures 3.4 mmHg. Aortic valve area, by VTI measures 3.05 cm. Pulmonic Valve: The pulmonic valve was normal in structure. Pulmonic valve regurgitation is mild. No evidence of pulmonic stenosis. Aorta: The aortic root and ascending aorta are structurally normal, with no evidence of dilitation. IAS/Shunts: The atrial septum is grossly normal. Additional Comments: A device lead is visualized.  LEFT VENTRICLE PLAX 2D LVIDd:         7.40 cm   Diastology LVIDs:         6.70 cm   LV e' medial:    6.85 cm/s LV PW:         1.20  cm   LV E/e' medial:  15.8 LV IVS:        0.80 cm   LV e' lateral:   12.30 cm/s LVOT diam:     2.20 cm   LV E/e' lateral: 8.8 LV SV:         51 LV SV Index:   26 LVOT Area:     3.80 cm  RIGHT VENTRICLE RV S prime:     7.62 cm/s TAPSE (M-mode): 1.5 cm LEFT ATRIUM             Index        RIGHT ATRIUM           Index LA diam:        4.90 cm 2.53 cm/m   RA Area:     23.40 cm LA Vol (A2C):   98.3 ml 50.73 ml/m  RA Volume:   77.70 ml  40.10 ml/m LA Vol (A4C):   66.2 ml 34.16 ml/m LA Biplane Vol: 81.9 ml 42.26 ml/m  AORTIC VALVE AV Area (Vmax):    3.31 cm AV Area (Vmean):   3.19 cm AV Area (VTI):     3.05 cm AV Vmax:  91.70 cm/s AV Vmean:          61.400 cm/s AV VTI:            0.168 m AV Peak Grad:      3.4 mmHg AV Mean Grad:      2.0 mmHg LVOT Vmax:         79.80 cm/s LVOT Vmean:        51.600 cm/s LVOT VTI:          0.135 m LVOT/AV VTI ratio: 0.80  AORTA Ao Root diam: 3.50 cm Ao Asc diam:  2.90 cm MITRAL VALVE                TRICUSPID VALVE MV Area (PHT): 4.36 cm     TR Peak grad:   20.1 mmHg MV Decel Time: 174 msec     TR Vmax:        224.00 cm/s MV E velocity: 108.00 cm/s MV A velocity: 47.50 cm/s   SHUNTS MV E/A ratio:  2.27         Systemic VTI:  0.14 m                             Systemic Diam: 2.20 cm Sunit Tolia Electronically signed by Tessa Lerner Signature Date/Time: 10/08/2023/8:04:07 PM    Final      Medications:     Current Medications:  Chlorhexidine Gluconate Cloth  6 each Topical Daily   mupirocin ointment  1 Application Nasal BID    Infusions:  cefTRIAXone (ROCEPHIN)  IV 2 g (10/09/23 0941)   heparin 1,400 Units/hr (10/09/23 1026)   metronidazole 500 mg (10/09/23 0940)      Patient Profile   38 y.o. male with history of CAD, ICM/chronic systolic CHF s/p ICD, VT, hx IV drug abuse, hx DVT/PE, chronic Hep C, MSSA bactermia 10/24, Crohn's disease. Admitted with chest pain, N, V, acute colitis.   Assessment/Plan  Acute on chronic systolic CHF ICM -s/p ICD  10/24 -EF has been < 20% for several years -Echo this admit: EF < 20%, cannot rule out LV apical thrombus, RV mildly reduced, severe MR -NYHA III. Volume up on exam. Start IV lasix 80 BID.  -Start spiro 12.5 mg daily -Add digoxin 0.125 mg daily -BP too soft for further GDMT titration -Obtain cMRI to definitively assess for LV thrombus  2. CAD -Hx anterior STEMI in 2017 in setting of cocaine, heroin and tobacco use. Had PTCA/DES to ostial/prox LAD and mid LAD, PTCA to D1 -Restart aspirin and statin -HS troponin mildly elevated with flat trend  3. MR -Severe on echo -Reassess once GDMT titrated  4. Hx VT -S/p ICD placement  -Amiodarone previously stopped d/t elevated LFTs in setting of septic shock -Later on mexiletine but stopped at least a few months ago  5. Possible LV thrombus -Echo as above -On heparin gtt. If no evidence of thrombus on cMRI may need to stop anticoagulation d/t anemia/bleeding  5. Chronic Anemia  Melena -Iron stores are low. Would benefit from feraheme once MRI completed -Would consider GI evaluation  6. Abdominal pain N, V -Evidence of colitis on CT -On ceftriaxone and metronidazole per primary - 1/4 BC + for staph epidermidis. Felt to be contaminant -Consider GI evaluation as above  7. Polysubstance abuse -Hx IV drug use -Cocaine, Methamphetamine, Heroin, Tobacco -Denies recent drug use other than suboxone -UDS pending  Length of Stay: 1  FINCH, LINDSAY N,  PA-C  10/09/2023, 11:30 AM  Advanced Heart Failure Team Pager [phone] (M-F; 7a - 5p)  Please contact [facility] for night-coverage after hours (4p -7a ) and weekends on [email or web address]   Patient seen with PA, I formulated the plan and agree with the above note.   Patient has long history of CHF/cardiomyopathy, ?mixed ischemic/nonischemic (CAD and long h/o substance abuse).  EF has been in the 20-25% rnage long-term.  He has a Medtronic ICD from 10/24, placed after VT episodes.  He also  has Crohns disease with h/o peri-anal fistula. He was admitted with nausea/vomiting and abdominal pain, but also with exertional dyspnea.  He is on heparin gtt due to possible LV thrombus, but he has noted some hematochezia.  He has not been taking any medications for about 3 months.  He came home from jail about 3 days ago, living in [place] now.   CT abdomen/pelvis showed colitis involving the descending and sigmoid colon, he was started on ceftriaxone/Flagyl.   Echo showed EF < 20%, severe LV dilation and possible LV thrombus, severe functional mitral regurgitation, mild RV dysfunction, dilated IVC.   General: NAD Neck: JVP 14 cm, no thyromegaly or thyroid nodule.  Lungs: Clear to auscultation bilaterally with normal respiratory effort. CV: Nondisplaced PMI.  Heart regular S1/S2, no S3/S4, 2/6 HSM apex.  No peripheral edema.  No carotid bruit.  Normal pedal pulses.  Abdomen: Soft, mild diffuse tenderness, no hepatosplenomegaly, no distention.  Skin: Intact without lesions or rashes.  Neurologic: Alert and oriented x 3.  Psych: Normal affect. Extremities: No clubbing or cyanosis.  HEENT: Normal.   1. Acute on chronic systolic CHF: Cardiomyopathy known since his MI in 2017, EF has generally been in the 25% range.  Has Medtronic ICD.  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  No meds x 3 months and now decompensated.  Echo this admission showed EF < 20%, severe LV dilation, severe functional mitral regurgitation, mild RV dysfunction, dilated IVC and possible LV thrombus. He is volume overloaded on exam.  NYHA class III exertional dyspnea.  - Start lasix 80 mg IV bid.  - Start spironolactone 12.5 daily.  - Start digoxin 0.125 daily.  - I will arrange for cardiac MRI to assess for infiltrative disease and LV thrombus.  - Not candidate for advanced therapies with social issues and  substance abuse.  2. CAD: Anterior STEMI in 2017 in setting of cocaine, heroin and tobacco use. Had DES x 2 to ostial/prox LAD and mid LAD, PTCA to D1. No recent chest pain and HS-TnI not significantly elevated.  - Restart statin.  - Hold off on ASA for now given anticoagulation.  3. Mitral regurgitation: Severe functional MR due to dilated LV.  Follow for now, may improve with diuresis and GDMT.  4. VT: Has Medtronic ICD.  Has been on both amiodarone and mexiletine in the past, not on either at this time.  - Follow telemetry.  5. ?LV thrombus: Possible thrombus on echo.   - Currently on heparin gtt.  - Cardiac MRI for better thrombus assessment.  6. GI: Has Crohn's disease with h/o peri-anal fistula. Was on a Crohn's medication but stopped it while in jail.  He is not sure what it was.  He does report maroon stool and is anemic. CT abdomen/pelvis with colitis involving the descending and sigmoid colon.  - He is on ceftriaxone/Flagyl.  - Consider GI consult.  -  Currently on anticoagulation, this may be an issue if actively bleeding.  - FOBT.  7. Anemia: Fe deficiency.  - Would give IV Fe after cMRI.  8. Polysubstance abuse: H/o cocaine, heroin, amphetamines, smoking. Has been on suboxone.  Says he has not used anything since leaving jail 3 days ago.  - UDS 9. Staph epidermidis on blood culture: May be contaminant as 1/4.  Afebrile, normal WBCs.  If true bacteremia, would be concerning given implanted device.  10. HCV: LFTs normal. 11. H/o PE/DVT: Was not on anticoagulation at admission.  CTA chest with no PE at admission.   Marca Ancona 10/09/2023 2:10 PM

## 2023-10-09 NOTE — Progress Notes (Signed)
 [facility] night cross cover note:   I was notified by RN that the patient is requesting additional IV Dilaudid for his left lower quadrant abdominal discomfort.  He previously had orders for Dilaudid 0.5 mg IV every 4 hours as needed x 4 occurrences as well as prn oxycodone x 4 occurrences. He has now met the 4 dose threshold associated with both of these orders.   I subsequently place an order for an additional dose of Dilaudid 0.5 mg iv x 1 now.      Newton Pigg, DO Hospitalist

## 2023-10-10 DIAGNOSIS — I214 Non-ST elevation (NSTEMI) myocardial infarction: Secondary | ICD-10-CM

## 2023-10-10 DIAGNOSIS — R079 Chest pain, unspecified: Secondary | ICD-10-CM | POA: Diagnosis not present

## 2023-10-10 LAB — BASIC METABOLIC PANEL
Anion gap: 9 (ref 5–15)
BUN: 22 mg/dL — ABNORMAL HIGH (ref 6–20)
CO2: 24 mmol/L (ref 22–32)
Calcium: 8.2 mg/dL — ABNORMAL LOW (ref 8.9–10.3)
Chloride: 103 mmol/L (ref 98–111)
Creatinine, Ser: 1.4 mg/dL — ABNORMAL HIGH (ref 0.61–1.24)
GFR, Estimated: >60 mL/min (ref >60)
Glucose, Bld: 127 mg/dL — ABNORMAL HIGH (ref 70–99)
Potassium: 3.8 mmol/L (ref 3.5–5.1)
Sodium: 136 mmol/L (ref 135–145)

## 2023-10-10 LAB — CBC
HCT: 28.2 % — ABNORMAL LOW (ref 39.0–52.0)
Hemoglobin: 8.4 g/dL — ABNORMAL LOW (ref 13.0–17.0)
MCH: 19.9 pg — ABNORMAL LOW (ref 26.0–34.0)
MCHC: 29.8 g/dL — ABNORMAL LOW (ref 30.0–36.0)
MCV: 66.8 fL — ABNORMAL LOW (ref 80.0–100.0)
Platelets: 278 10*3/uL (ref 150–400)
RBC: 4.22 MIL/uL (ref 4.22–5.81)
RDW: 18.5 % — ABNORMAL HIGH (ref 11.5–15.5)
WBC: 4.9 10*3/uL (ref 4.0–10.5)
nRBC: 0 % (ref 0.0–0.2)

## 2023-10-10 LAB — HEPARIN LEVEL (UNFRACTIONATED)
Heparin Unfractionated: <0.1 [IU]/mL — ABNORMAL LOW (ref 0.30–0.70)
Heparin Unfractionated: 0.12 [IU]/mL — ABNORMAL LOW (ref 0.30–0.70)

## 2023-10-10 LAB — RAPID URINE DRUG SCREEN, HOSP PERFORMED
Amphetamines: POSITIVE — AB
Barbiturates: NOT DETECTED
Benzodiazepines: NOT DETECTED
Cocaine: NOT DETECTED
Opiates: POSITIVE — AB
Tetrahydrocannabinol: POSITIVE — AB

## 2023-10-10 MED ORDER — TORSEMIDE 20 MG PO TABS
40.0000 mg | ORAL_TABLET | Freq: Every day | ORAL | Status: DC
  Filled 2023-10-10 (×2): qty 2

## 2023-10-10 MED ORDER — OXYCODONE HCL 5 MG PO TABS
10.0000 mg | ORAL_TABLET | Freq: Four times a day (QID) | ORAL | Status: AC | PRN
  Administered 2023-10-10 – 2023-10-11 (×4): 10 mg via ORAL
  Filled 2023-10-10 (×4): qty 2

## 2023-10-10 MED ORDER — POTASSIUM CHLORIDE CRYS ER 20 MEQ PO TBCR
40.0000 meq | EXTENDED_RELEASE_TABLET | Freq: Once | ORAL | Status: AC
  Administered 2023-10-10: 40 meq via ORAL
  Filled 2023-10-10: qty 2

## 2023-10-10 MED ORDER — FUROSEMIDE 10 MG/ML IJ SOLN
80.0000 mg | Freq: Once | INTRAMUSCULAR | Status: AC
  Administered 2023-10-10: 80 mg via INTRAVENOUS
  Filled 2023-10-10: qty 8

## 2023-10-10 NOTE — Plan of Care (Signed)
  Problem: Education: Goal: Ability to demonstrate management of disease process will improve Outcome: Progressing Goal: Ability to verbalize understanding of medication therapies will improve Outcome: Progressing   Problem: Activity: Goal: Capacity to carry out activities will improve Outcome: Progressing   Problem: Cardiac: Goal: Ability to achieve and maintain adequate cardiopulmonary perfusion will improve Outcome: Progressing

## 2023-10-10 NOTE — Progress Notes (Signed)
 PROGRESS NOTE    Shaun [name]  WUJ:[id number] DOB: 31-Mar-1986 DOA: 10/07/2023 PCP: Maye Hides, PA   Brief Narrative:  This is a 38 year old gentleman with medical history significant for chronic HFrEF, history of pulmonary embolism and DVT no longer on anticoagulation, MSSA bacteremia in October 2024, Crohn's disease, IV drug abuse, last IV drug use was a few days ago presented to ED with abdominal pain, chest pain and nausea and vomiting and admitted under hospital service. Troponins were very slightly elevated and flat, not indicative of ACS, ACS ruled out.  Despite of having elevated BNP and not knowing baseline, he appeared euvolemic, has a history of chronic heart failure with reduced ejection fraction.  Also has a history of chron's disease but CT scan at this time shows left sided colitis and he does complain of abdominal pain on the left side and is tender to palpation as well.  Patient was started on Rocephin and Flagyl.  Cardiology consulted.  Details below.  Assessment & Plan:   Principal Problem:   Chest pain Active Problems:   AKI (acute kidney injury) (HCC)  History of Crohn's disease presented with abdominal pain/nausea/vomiting/acute colitis: Symptoms are improving, no tenderness today, continue Flagyl and Rocephin.  Patient demands for IV pain medications intermittently, due to history of IV drug abuse, will not give any IV pain medications.  Reports of hematochezia.  CBC was ordered for 5 PM yesterday and this morning and none of those labs were collected until I had to asked the nurse yet again today.  FOBT was positive.  Per patient, he has had only 1 bowel movement with hematochezia in last 24 hours but he had at least 9 prior to coming here which was not documented here and he did not mention to anyone.  Nothing on CT scan that warrants inpatient GI consultation at the moment however he will benefit from GI follow-up as outpatient.  Chest pain: Anterior STEMI in  2017 in setting of cocaine, heroin and tobacco use. Had DES x 2 to ostial/prox LAD and mid LAD, PTCA to D1. ACS ruled out, troponins x 2 negative.  CT angio negative for PE.  Aspirin on hold due to being on anticoagulation.    Acute on chronic chronic heart failure with reduced ejection fraction/left ventricular thrombus: Cardiomyopathy known since his MI in 2017, EF has generally been in the 25% range. Has Med[employer] ICD. 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 No meds x 3 months and now decompensated. Echo this admission showed EF < 20%, severe LV dilation, severe functional mitral regurgitation, mild RV dysfunction, dilated IVC and possible LV thrombus.  Patient started on heparin and heart failure team consulted.  He was started on Lasix IV 80 mg twice daily, Aldactone and digoxin and cardiac MRI is ordered and pending.  Since hemoglobin is stable, will continue heparin.  Discussed in length with the patient about advantages and disadvantages of anticoagulation.  VT: Has Medtronic ICD. Has been on both amiodarone and mexiletine in the past, not on either at this time.   Substance abuse/IV drug abuse/history of MSSA bacteremia and October 2024: Endorses self injected Suboxone while in prison.  UDS appears to have been collected but results pending.  Blood culture are growing staph epi dermatitis, likely contamination.  However also mecA resistance detected, suspicious for possible MRSA infection.  Will start on vancomycin until final cultures are reported. Addendum: Discussed  with pharmacy, he is growing staph epidermidis and only one of the 4 bottles, which is likely contamination so we will discontinue antibiotics.  Severe iron deficiency anemia/hematochezia: Hemoglobin at baseline appears to be 10 but dropped to 8 which improved to 8.5.  Iron studies indicate severe iron deficiency  anemia.  Will need oral iron replacement at discharge.  Will order IV iron after cardiac MRI completed.  Hematochezia is improving and his hemoglobin has remained stable at 8.5 for last 2 days.  Hypokalemia: Resolved.  Possible CKD stage IIIa: Last known creatinine is from August 2021 which was normal.  Presented with 1.94 this time which is improved to 1.5 today.  This appears to be his baseline.  DVT prophylaxis: Heparin   Code Status: Full Code  Family Communication:  None present at bedside.  Plan of care discussed with patient in length and he/she verbalized understanding and agreed with it.  Status is: Inpatient Remains inpatient appropriate because: Needs to be seen by cardiology   Estimated body mass index is 21.7 kg/m as calculated from the following:   Height as of this encounter: 6' (1.829 m).   Weight as of this encounter: 72.6 kg.    Nutritional Assessment: Body mass index is 21.7 kg/m.Marland Kitchen Seen by dietician.  I agree with the assessment and plan as outlined below: Nutrition Status:        . Skin Assessment: I have examined the patient's skin and I agree with the wound assessment as performed by the wound care RN as outlined below:    Consultants:  Cardiology  Procedures:  None  Antimicrobials:  Anti-infectives (From admission, onward)    Start     Dose/Rate Route Frequency Ordered Stop   10/08/23 1100  cefTRIAXone (ROCEPHIN) 2 g in sodium chloride 0.9 % 100 mL IVPB        2 g 200 mL/hr over 30 Minutes Intravenous Daily 10/08/23 1046     10/08/23 1100  metroNIDAZOLE (FLAGYL) IVPB 500 mg        500 mg 100 mL/hr over 60 Minutes Intravenous 2 times daily 10/08/23 1046           Subjective: Patient seen and examined.  He feels better today.  No nausea.  Tolerating regular diet.  Very minimal abdominal pain but no tenderness.  Only 1 episode of hematochezia in last 24 hours.  Discussed in length about anticoagulants.  Objective: Vitals:   10/09/23 1339  10/09/23 1421 10/09/23 1958 10/10/23 0512  BP:  (!) 120/91 (!) 123/93 99/80  Pulse: (!) 101  92 80  Resp:   16 20  Temp:   97.8 F (36.6 C) 98 F (36.7 C)  TempSrc:   Oral Oral  SpO2:   98% 100%  Weight:      Height:        Intake/Output Summary (Last 24 hours) at 10/10/2023 0708 Last data filed at 10/09/2023 2209 Gross per 24 hour  Intake 240 ml  Output 400 ml  Net -160 ml   Filed Weights   10/07/23 1720  Weight: 72.6 kg    Examination:  General exam: Appears calm and comfortable  Respiratory system: Clear to auscultation. Respiratory effort normal. Cardiovascular system: S1 & S2 heard, RRR. No JVD, murmurs, rubs, gallops or clicks. No pedal edema. Gastrointestinal system: Abdomen is nondistended, soft and nontender. No organomegaly or masses felt. Normal bowel sounds heard. Central nervous system: Alert and oriented. No focal neurological deficits. Extremities: Symmetric 5 x 5 power. Skin: No  rashes, lesions or ulcers.  Psychiatry: Judgement and insight appear normal. Mood & affect appropriate.   Data Reviewed: I have personally reviewed following labs and imaging studies  CBC: Recent Labs  Lab 10/07/23 1843 10/07/23 1848 10/08/23 0431 10/08/23 1833 10/09/23 0438  WBC 6.7  --  5.2 5.1 4.9  NEUTROABS  --   --   --  3.0 2.9  HGB 8.9* 10.2* 8.0* 8.6* 8.5*  HCT 30.9* 30.0* 27.5* 29.3* 29.0*  MCV 68.7*  --  68.8* 67.8* 66.8*  PLT 399  --  327 307 357   Basic Metabolic Panel: Recent Labs  Lab 10/07/23 1843 10/07/23 1848 10/08/23 0431 10/09/23 0438  NA 135 140 136 133*  K 4.4 4.5 3.2* 4.5  CL 103 106 101 106  CO2 21*  --  24 21*  GLUCOSE 124* 119* 96 103*  BUN 39* 36* 33* 32*  CREATININE 1.94* 2.10* 1.63* 1.49*  CALCIUM 8.5*  --  7.8* 7.7*  MG  --   --  1.8  --   PHOS  --   --  4.0  --    GFR: Estimated Creatinine Clearance: 69 mL/min (A) (by C-G formula based on SCr of 1.49 mg/dL (H)). Liver Function Tests: Recent Labs  Lab 10/07/23 1843  10/08/23 0431 10/09/23 0438  AST 26 21 29   ALT 14 12 15   ALKPHOS 72 68 94  BILITOT 1.5* 0.9 1.0  PROT 7.6 6.3* 6.5  ALBUMIN 3.3* 2.7* 2.7*   No results for input(s): "LIPASE", "AMYLASE" in the last 168 hours. No results for input(s): "AMMONIA" in the last 168 hours. Coagulation Profile: Recent Labs  Lab 10/08/23 2038  INR 1.4*   Cardiac Enzymes: No results for input(s): "CKTOTAL", "CKMB", "CKMBINDEX", "TROPONINI" in the last 168 hours. BNP (last 3 results) No results for input(s): "PROBNP" in the last 8760 hours. HbA1C: No results for input(s): "HGBA1C" in the last 72 hours. CBG: No results for input(s): "GLUCAP" in the last 168 hours. Lipid Profile: No results for input(s): "CHOL", "HDL", "LDLCALC", "TRIG", "CHOLHDL", "LDLDIRECT" in the last 72 hours. Thyroid Function Tests: No results for input(s): "TSH", "T4TOTAL", "FREET4", "T3FREE", "THYROIDAB" in the last 72 hours. Anemia Panel: Recent Labs    10/08/23 1531  VITAMINB12 674  FOLATE 12.0  FERRITIN 8*  TIBC 455*  IRON 19*  RETICCTPCT 1.7   Sepsis Labs: No results for input(s): "PROCALCITON", "LATICACIDVEN" in the last 168 hours.  Recent Results (from the past 240 hours)  Resp panel by RT-PCR (RSV, Flu A&B, Covid) Anterior Nasal Swab     Status: None   Collection Time: 10/07/23  7:33 PM   Specimen: Anterior Nasal Swab  Result Value Ref Range Status   SARS Coronavirus 2 by RT PCR NEGATIVE NEGATIVE Final   Influenza A by PCR NEGATIVE NEGATIVE Final   Influenza B by PCR NEGATIVE NEGATIVE Final    Comment: (NOTE) The Xpert Xpress SARS-CoV-2/FLU/RSV plus assay is intended as an aid in the diagnosis of influenza from Nasopharyngeal swab specimens and should not be used as a sole basis for treatment. Nasal washings and aspirates are unacceptable for Xpert Xpress SARS-CoV-2/FLU/RSV testing.  Fact Sheet for Patients: BloggerCourse.com  Fact Sheet for Healthcare  Providers: SeriousBroker.it  This test is not yet approved or cleared by the [place] FDA and has been authorized for detection and/or diagnosis of SARS-CoV-2 by FDA under an Emergency Use Authorization (EUA). This EUA will remain in effect (meaning this test can be used) for the duration  of the COVID-19 declaration under Section 564(b)(1) of the Act, 21 U.S.C. section 360bbb-3(b)(1), unless the authorization is terminated or revoked.     Resp Syncytial Virus by PCR NEGATIVE NEGATIVE Final    Comment: (NOTE) Fact Sheet for Patients: BloggerCourse.com  Fact Sheet for Healthcare Providers: SeriousBroker.it  This test is not yet approved or cleared by the [place] FDA and has been authorized for detection and/or diagnosis of SARS-CoV-2 by FDA under an Emergency Use Authorization (EUA). This EUA will remain in effect (meaning this test can be used) for the duration of the COVID-19 declaration under Section 564(b)(1) of the Act, 21 U.S.C. section 360bbb-3(b)(1), unless the authorization is terminated or revoked.  Performed at [facility], [address], [place], [place] [address]   Culture, blood (Routine X 2) w Reflex to ID Panel     Status: None (Preliminary result)   Collection Time: 10/08/23  4:31 AM   Specimen: BLOOD RIGHT ARM  Result Value Ref Range Status   Specimen Description BLOOD RIGHT ARM  Final   Special Requests   Final    BOTTLES DRAWN AEROBIC AND ANAEROBIC Blood Culture results may not be optimal due to an inadequate volume of blood received in culture bottles   Culture   Final    NO GROWTH 1 DAY Performed at [facility], [address], [place], [place] [address]    Report Status PENDING  Incomplete  Culture, blood (Routine X 2) w Reflex to ID Panel     Status: None (Preliminary result)   Collection Time: 10/08/23  4:35 AM   Specimen: BLOOD LEFT ARM  Result  Value Ref Range Status   Specimen Description BLOOD LEFT ARM  Final   Special Requests   Final    BOTTLES DRAWN AEROBIC AND ANAEROBIC Blood Culture results may not be optimal due to an inadequate volume of blood received in culture bottles   Culture  Setup Time   Final    GRAM POSITIVE COCCI IN CLUSTERS AEROBIC BOTTLE ONLY CRITICAL RESULT CALLED TO, READ BACK BY AND VERIFIED WITH: PHARMD J LEDFORD 10/09/2023 @ 0539 BY AB Performed at [facility], [address] [place], [place] [address]    Culture GRAM POSITIVE COCCI  Final   Report Status PENDING  Incomplete  Blood Culture ID Panel (Reflexed)     Status: Abnormal   Collection Time: 10/08/23  4:35 AM  Result Value Ref Range Status   Enterococcus faecalis NOT DETECTED NOT DETECTED Final   Enterococcus Faecium NOT DETECTED NOT DETECTED Final   Listeria monocytogenes NOT DETECTED NOT DETECTED Final   Staphylococcus species DETECTED (A) NOT DETECTED Final    Comment: CRITICAL RESULT CALLED TO, READ BACK BY AND VERIFIED WITH: PHARMD J LEDFORD 10/09/2023 @ 0539 BY AB    Staphylococcus aureus (BCID) NOT DETECTED NOT DETECTED Final   Staphylococcus epidermidis DETECTED (A) NOT DETECTED Final    Comment: Methicillin (oxacillin) resistant coagulase negative staphylococcus. Possible blood culture contaminant (unless isolated from more than one blood culture draw or clinical case suggests pathogenicity). No antibiotic treatment is indicated for blood  culture contaminants. CRITICAL RESULT CALLED TO, READ BACK BY AND VERIFIED WITH: PHARMD J LEDFORD 10/09/2023 @ 0539 BY AB    Staphylococcus lugdunensis NOT DETECTED NOT DETECTED Final   Streptococcus species NOT DETECTED NOT DETECTED Final   Streptococcus agalactiae NOT DETECTED NOT DETECTED Final   Streptococcus pneumoniae NOT DETECTED NOT DETECTED Final   Streptococcus pyogenes NOT DETECTED  NOT DETECTED Final   A.calcoaceticus-baumannii NOT DETECTED NOT DETECTED Final   Bacteroides  fragilis NOT DETECTED NOT DETECTED Final   Enterobacterales NOT DETECTED NOT DETECTED Final   Enterobacter cloacae complex NOT DETECTED NOT DETECTED Final   Escherichia coli NOT DETECTED NOT DETECTED Final   Klebsiella aerogenes NOT DETECTED NOT DETECTED Final   Klebsiella oxytoca NOT DETECTED NOT DETECTED Final   Klebsiella pneumoniae NOT DETECTED NOT DETECTED Final   Proteus species NOT DETECTED NOT DETECTED Final   Salmonella species NOT DETECTED NOT DETECTED Final   Serratia marcescens NOT DETECTED NOT DETECTED Final   Haemophilus influenzae NOT DETECTED NOT DETECTED Final   Neisseria meningitidis NOT DETECTED NOT DETECTED Final   Pseudomonas aeruginosa NOT DETECTED NOT DETECTED Final   Stenotrophomonas maltophilia NOT DETECTED NOT DETECTED Final   Candida albicans NOT DETECTED NOT DETECTED Final   Candida auris NOT DETECTED NOT DETECTED Final   Candida glabrata NOT DETECTED NOT DETECTED Final   Candida krusei NOT DETECTED NOT DETECTED Final   Candida parapsilosis NOT DETECTED NOT DETECTED Final   Candida tropicalis NOT DETECTED NOT DETECTED Final   Cryptococcus neoformans/gattii NOT DETECTED NOT DETECTED Final   Methicillin resistance mecA/C DETECTED (A) NOT DETECTED Final    Comment: CRITICAL RESULT CALLED TO, READ BACK BY AND VERIFIED WITH: PHARMD J LEDFORD 10/09/2023 @ 0539 BY AB Performed at [facility], [address] [place], [place] [address]   MRSA Next Gen by PCR, Nasal     Status: Abnormal   Collection Time: 10/08/23  7:46 PM   Specimen: Nasal Mucosa; Nasal Swab  Result Value Ref Range Status   MRSA by PCR Next Gen DETECTED (A) NOT DETECTED Final    Comment: RESULT CALLED TO, READ BACK BY AND VERIFIED WITH: RN Hurley Cisco [phone] @ 2133 FH (NOTE) The GeneXpert MRSA Assay (FDA approved for NASAL specimens only), is one component of a comprehensive MRSA colonization surveillance program. It is not intended to diagnose MRSA infection nor to guide or monitor  treatment for MRSA infections. Test performance is not FDA approved in patients less than 42 years old. Performed at [facility], [address], [place], [place] [address]      Radiology Studies: DG CHEST PORT 1 VIEW Result Date: 10/09/2023 CLINICAL DATA:  Acute on chronic systolic heart failure. EXAM: PORTABLE CHEST 1 VIEW COMPARISON:  Chest CTA 10/07/2023 FINDINGS: Dual lead left-sided pacemaker in place. The heart is enlarged. No features of pulmonary edema, large pleural effusion, focal airspace disease or pneumothorax. Costophrenic angles are not included in the field of view. IMPRESSION: Cardiomegaly without pulmonary edema. Electronically Signed   By: Narda Rutherford M.D.   On: 10/09/2023 15:52   ECHOCARDIOGRAM COMPLETE Result Date: 10/08/2023    ECHOCARDIOGRAM REPORT   Patient Name:   Shaun [name] Date of Exam: 10/08/2023 Medical Rec #:  [id number]        Height:       72.0 in Accession #:    [id number]       Weight:       160.0 lb Date of Birth:  November 06, 1985        BSA:          1.938 m Patient Age:    37 years         BP:           120/90 mmHg Patient Gender: M  HR:           90 bpm. Exam Location:  Inpatient Procedure: 2D Echo, Cardiac Doppler, Color Doppler and Intracardiac            Opacification Agent (Both Spectral and Color Flow Doppler were            utilized during procedure). Indications:    Chest Pain  History:        Patient has no prior history of Echocardiogram examinations.                 Cardiomyopathy, CAD; Risk Factors:Hypertension.  Sonographer:    Melton Krebs RDCS, FE, PE Referring Phys: [id number] CAROLE N HALL IMPRESSIONS  1. Cannot rule out a left ventricular thromubs at the apex (image 82). Consider cMRI for further evaluation. Clincal correlation required. Left ventricular ejection fraction, by estimation, is <20%. The left ventricle has severely decreased function. The left ventricle demonstrates global hypokinesis. The left ventricular  internal cavity size was severely dilated. Left ventricular diastolic parameters are consistent with Grade III diastolic dysfunction (restrictive).  2. Right ventricular systolic function is mildly reduced. The right ventricular size is normal.  3. Left atrial size was moderately dilated.  4. Right atrial size was moderately dilated.  5. The mitral valve is grossly normal. Severe mitral valve regurgitation. No evidence of mitral stenosis.  6. The aortic valve is normal in structure. Aortic valve regurgitation is not visualized. No aortic stenosis is present. Comparison(s): A prior study was performed on 12/31/2019. Mild MR is now severe. Cannot rule out a left ventricular thromubs at the apex. Consider cMRI for further evaluation. Clincal correlation required. Conclusion(s)/Recommendation(s): Findings reported to Dr. Hughie Closs via secure chat (around 7:41pm) and he acknowledges the pertinent findings. Recommend cardiology consult. FINDINGS  Left Ventricle: Cannot rule out a left ventricular thromubs at the apex (image 82). Consider cMRI for further evaluation. Clincal correlation required. Left ventricular ejection fraction, by estimation, is <20%. The left ventricle has severely decreased  function. The left ventricle demonstrates global hypokinesis. The left ventricular internal cavity size was severely dilated. There is no left ventricular hypertrophy. Left ventricular diastolic parameters are consistent with Grade III diastolic dysfunction (restrictive). Right Ventricle: The right ventricular size is normal. No increase in right ventricular wall thickness. Right ventricular systolic function is mildly reduced. Left Atrium: Left atrial size was moderately dilated. Right Atrium: Right atrial size was moderately dilated. Pericardium: There is no evidence of pericardial effusion. Mitral Valve: The mitral valve is grossly normal. Severe mitral valve regurgitation, with centrally-directed jet. No evidence of mitral  valve stenosis. Tricuspid Valve: The tricuspid valve is grossly normal. Tricuspid valve regurgitation is mild . No evidence of tricuspid stenosis. Aortic Valve: The aortic valve is normal in structure. Aortic valve regurgitation is not visualized. No aortic stenosis is present. Aortic valve mean gradient measures 2.0 mmHg. Aortic valve peak gradient measures 3.4 mmHg. Aortic valve area, by VTI measures 3.05 cm. Pulmonic Valve: The pulmonic valve was normal in structure. Pulmonic valve regurgitation is mild. No evidence of pulmonic stenosis. Aorta: The aortic root and ascending aorta are structurally normal, with no evidence of dilitation. IAS/Shunts: The atrial septum is grossly normal. Additional Comments: A device lead is visualized.  LEFT VENTRICLE PLAX 2D LVIDd:         7.40 cm   Diastology LVIDs:         6.70 cm   LV e' medial:    6.85 cm/s LV PW:  1.20 cm   LV E/e' medial:  15.8 LV IVS:        0.80 cm   LV e' lateral:   12.30 cm/s LVOT diam:     2.20 cm   LV E/e' lateral: 8.8 LV SV:         51 LV SV Index:   26 LVOT Area:     3.80 cm  RIGHT VENTRICLE RV S prime:     7.62 cm/s TAPSE (M-mode): 1.5 cm LEFT ATRIUM             Index        RIGHT ATRIUM           Index LA diam:        4.90 cm 2.53 cm/m   RA Area:     23.40 cm LA Vol (A2C):   98.3 ml 50.73 ml/m  RA Volume:   77.70 ml  40.10 ml/m LA Vol (A4C):   66.2 ml 34.16 ml/m LA Biplane Vol: 81.9 ml 42.26 ml/m  AORTIC VALVE AV Area (Vmax):    3.31 cm AV Area (Vmean):   3.19 cm AV Area (VTI):     3.05 cm AV Vmax:           91.70 cm/s AV Vmean:          61.400 cm/s AV VTI:            0.168 m AV Peak Grad:      3.4 mmHg AV Mean Grad:      2.0 mmHg LVOT Vmax:         79.80 cm/s LVOT Vmean:        51.600 cm/s LVOT VTI:          0.135 m LVOT/AV VTI ratio: 0.80  AORTA Ao Root diam: 3.50 cm Ao Asc diam:  2.90 cm MITRAL VALVE                TRICUSPID VALVE MV Area (PHT): 4.36 cm     TR Peak grad:   20.1 mmHg MV Decel Time: 174 msec     TR Vmax:         224.00 cm/s MV E velocity: 108.00 cm/s MV A velocity: 47.50 cm/s   SHUNTS MV E/A ratio:  2.27         Systemic VTI:  0.14 m                             Systemic Diam: 2.20 cm Sunit Tolia Electronically signed by Tessa Lerner Signature Date/Time: 10/08/2023/8:04:07 PM    Final     Scheduled Meds:  Chlorhexidine Gluconate Cloth  6 each Topical Daily   digoxin  0.125 mg Oral Daily   furosemide  80 mg Intravenous Once   mupirocin ointment  1 Application Nasal BID   spironolactone  12.5 mg Oral Daily   Continuous Infusions:  cefTRIAXone (ROCEPHIN)  IV 2 g (10/09/23 0941)   heparin 1,650 Units/hr (10/10/23 0406)   metronidazole 500 mg (10/09/23 2208)     LOS: 2 days   Hughie Closs, MD [facility]  10/10/2023, 7:08 AM   *Please note that this is a verbal dictation therefore any spelling or grammatical errors are due to the "Dragon Medical One" system interpretation.  Please page via Amion and do not message via secure chat for urgent patient care matters. Secure chat can be used for non urgent patient care matters.  How to contact the [facility] Attending or Consulting provider 7A - 7P or covering provider during after hours 7P -7A, for this patient?  Check the care team in [facility] and look for a) attending/consulting [facility] provider listed and b) the [facility] team listed. Page or secure chat 7A-7P. Log into [email or web address] and use [place]'s universal password to access. If you do not have the password, please contact the hospital operator. Locate the [facility] provider you are looking for under [facility] and page to a number that you can be directly reached. If you still have difficulty reaching the provider, please page the [facility] (Director on Call) for the Hospitalists listed on amion for assistance.

## 2023-10-10 NOTE — Progress Notes (Signed)
   Rounding Note    Patient Name: Shaun [name] Date of Encounter: 10/10/2023  [facility] Cardiologist: None   Subjective   NAEO. Reports abdomen is still intermittently unsettled. Smaller amount of blood in stool. Does report his breathing is back to baseline.  Vital Signs    Vitals:   10/09/23 1958 10/10/23 0512 10/10/23 0925 10/10/23 1030  BP: (!) 123/93 99/80    Pulse: 92 80 90   Resp: 16 20  16   Temp: 97.8 F (36.6 C) 98 F (36.7 C)  98.4 F (36.9 C)  TempSrc: Oral Oral  Oral  SpO2: 98% 100%    Weight:      Height:        Intake/Output Summary (Last 24 hours) at 10/10/2023 1614 Last data filed at 10/09/2023 2209 Gross per 24 hour  Intake 240 ml  Output 400 ml  Net -160 ml      10/07/2023    5:20 PM 01/03/2020    8:08 AM 12/30/2019    7:50 PM  Last 3 Weights  Weight (lbs) 160 lb    Weight (kg) 72.576 kg       Information is confidential and restricted. Go to Review Flowsheets to unlock data.      Telemetry    Sinus. - Personally Reviewed  ECG    Personally Reviewed  Physical Exam   GEN: No acute distress.   Cardiac: RRR, no murmurs, rubs, or gallops.  Respiratory: Clear to auscultation bilaterally. Psych: Normal affect   Assessment & Plan    #Acute on chronic systolic heart failure NYHA II-III. EF 25%. His volume status is improving. Cont lasix 80mg  IV BID through today. Plan to transition to oral meds tomorrow Cont spironolactone Cont digoxin MRI pending  #CAD Cont statin No ischemic symptoms  #VT hx #ICD in situ Monitor  #?LV thrombus MRI pending Cont heparin gtt  #Bacteremia 1 out of 4 bottles. Likely contaminant given species and only 1 of 4      Anyi Fels T. Lalla Brothers, MD, [facility], [facility] Cardiac Electrophysiology

## 2023-10-10 NOTE — Progress Notes (Signed)
 PHARMACY - ANTICOAGULATION CONSULT NOTE  Pharmacy Consult for Heparin Indication: LV thrombus  Allergies  Allergen Reactions   Amoxicillin Swelling   Codeine Itching   Penicillins     Itching Did it involve swelling of the face/tongue/throat, SOB, or low BP? SOB & Swelling Which Caused Blood Clots Did it involve sudden or severe rash/hives, skin peeling, or any reaction on the inside of your mouth or nose? No Did you need to seek medical attention at a hospital or doctor's office? Y When did it last happen?  Over month ago     If all above answers are "NO", may proceed with cephalosporin use.     Ketorolac Itching and Other (See Comments)    nightmares    Patient Measurements: Height: 6' (182.9 cm) Weight: 72.6 kg (160 lb) IBW/kg (Calculated) : 77.6 Heparin Dosing Weight: 72.6 kg  Vital Signs: Temp: 98.4 F (36.9 C) (03/22 1030) Temp Source: Oral (03/22 1030) BP: 115/91 (03/22 1639) Pulse Rate: 90 (03/22 0925)  Labs: Recent Labs    10/07/23 1843 10/07/23 1848 10/07/23 1935 10/08/23 0431 10/08/23 1833 10/08/23 2038 10/09/23 0438 10/09/23 1238 10/09/23 2102 10/10/23 0754 10/10/23 1629  HGB 8.9*   < >  --  8.0* 8.6*  --  8.5*  --   --  8.4*  --   HCT 30.9*   < >  --  27.5* 29.3*  --  29.0*  --   --  28.2*  --   PLT 399  --   --  327 307  --  357  --   --  278  --   APTT  --   --   --   --   --  34  --   --   --   --   --   LABPROT  --   --   --   --   --  17.3*  --   --   --   --   --   INR  --   --   --   --   --  1.4*  --   --   --   --   --   HEPARINUNFRC  --   --   --   --   --   --  <0.10*   < > <0.10* <0.10* 0.12*  CREATININE 1.94*   < >  --  1.63*  --   --  1.49*  --   --  1.40*  --   TROPONINIHS 18*  --  16  --   --   --   --   --   --   --   --    < > = values in this interval not displayed.    Estimated Creatinine Clearance: 73.5 mL/min (A) (by C-G formula based on SCr of 1.4 mg/dL (H)).   Medical History: Past Medical History:  Diagnosis Date    Beta-hemolytic group B streptococcal sepsis (HCC) 10/2019   at [facility]   Chronic systolic CHF (congestive heart failure) (HCC) 12/30/2019   Coronary artery disease involving native coronary artery of native heart without angina pectoris 12/30/2019   Essential hypertension 12/30/2019   Hypertension    Intravenous drug abuse (HCC) 12/30/2019   Ischemic cardiomyopathy 12/30/2019   Mixed hyperlipidemia 12/30/2019   Nicotine dependence, cigarettes, uncomplicated 12/30/2019    Medications:  Scheduled:   Chlorhexidine Gluconate Cloth  6 each Topical Daily   digoxin  0.125 mg  Oral Daily   furosemide  80 mg Intravenous Once   mupirocin ointment  1 Application Nasal BID   spironolactone  12.5 mg Oral Daily   [START ON 10/11/2023] torsemide  40 mg Oral Daily   Infusions:   cefTRIAXone (ROCEPHIN)  IV 2 g (10/10/23 1610)   heparin 1,800 Units/hr (10/10/23 1006)   metronidazole 500 mg (10/10/23 0938)   PRN: melatonin, oxyCODONE, polyethylene glycol, prochlorperazine  Assessment: 38 yo male admitted with chest pain. Echo obtained which cannot rule out LV thrombus. Pharmacy consulted to dose IV heparin. 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  Heparin level came back low again. Hgb ~8s. We will increase then check level in AM. No bleeding per Rn.   Goal of Therapy:  Heparin level 0.3-0.5 units/ml Monitor platelets by anticoagulation protocol: Yes   Plan:  Increase heparin to 2000 units/hr Check level in AM F/u cMRI results and longterm [facility] plan  Ulyses Southward, PharmD, BCIDP, AAHIVP, CPP Infectious Disease Pharmacist 10/10/2023 5:36 PM

## 2023-10-10 NOTE — Progress Notes (Signed)
 PHARMACY - ANTICOAGULATION CONSULT NOTE  Pharmacy Consult for Heparin Indication: LV thrombus  Allergies  Allergen Reactions   Amoxicillin Swelling   Codeine Itching   Penicillins     Itching Did it involve swelling of the face/tongue/throat, SOB, or low BP? SOB & Swelling Which Caused Blood Clots Did it involve sudden or severe rash/hives, skin peeling, or any reaction on the inside of your mouth or nose? No Did you need to seek medical attention at a hospital or doctor's office? Y When did it last happen?  Over month ago     If all above answers are "NO", may proceed with cephalosporin use.     Ketorolac Itching and Other (See Comments)    nightmares    Patient Measurements: Height: 6' (182.9 cm) Weight: 72.6 kg (160 lb) IBW/kg (Calculated) : 77.6 Heparin Dosing Weight: 72.6 kg  Vital Signs: Temp: 98 F (36.7 C) (03/22 0512) Temp Source: Oral (03/22 0512) BP: 99/80 (03/22 0512) Pulse Rate: 90 (03/22 0925)  Labs: Recent Labs    10/07/23 1843 10/07/23 1848 10/07/23 1935 10/08/23 0431 10/08/23 1833 10/08/23 1833 10/08/23 2038 10/09/23 0438 10/09/23 1238 10/09/23 2102 10/10/23 0754  HGB 8.9*   < >  --  8.0* 8.6*  --   --  8.5*  --   --  8.4*  HCT 30.9*   < >  --  27.5* 29.3*  --   --  29.0*  --   --  28.2*  PLT 399  --   --  327 307  --   --  357  --   --  278  APTT  --   --   --   --   --   --  34  --   --   --   --   LABPROT  --   --   --   --   --   --  17.3*  --   --   --   --   INR  --   --   --   --   --   --  1.4*  --   --   --   --   HEPARINUNFRC  --   --   --   --   --    < >  --  <0.10* <0.10* <0.10* <0.10*  CREATININE 1.94*   < >  --  1.63*  --   --   --  1.49*  --   --  1.40*  TROPONINIHS 18*  --  16  --   --   --   --   --   --   --   --    < > = values in this interval not displayed.    Estimated Creatinine Clearance: 73.5 mL/min (A) (by C-G formula based on SCr of 1.4 mg/dL (H)).   Medical History: Past Medical History:  Diagnosis Date    Beta-hemolytic group B streptococcal sepsis (HCC) 10/2019   at [facility]   Chronic systolic CHF (congestive heart failure) (HCC) 12/30/2019   Coronary artery disease involving native coronary artery of native heart without angina pectoris 12/30/2019   Essential hypertension 12/30/2019   Hypertension    Intravenous drug abuse (HCC) 12/30/2019   Ischemic cardiomyopathy 12/30/2019   Mixed hyperlipidemia 12/30/2019   Nicotine dependence, cigarettes, uncomplicated 12/30/2019    Medications:  Scheduled:   Chlorhexidine Gluconate Cloth  6 each Topical Daily   digoxin  0.125 mg  Oral Daily   furosemide  80 mg Intravenous Once   mupirocin ointment  1 Application Nasal BID   spironolactone  12.5 mg Oral Daily   Infusions:   cefTRIAXone (ROCEPHIN)  IV 2 g (10/10/23 1610)   heparin 1,650 Units/hr (10/10/23 0406)   metronidazole 500 mg (10/10/23 0938)   PRN: melatonin, polyethylene glycol, prochlorperazine  Assessment: 38 yo male admitted with chest pain. Echo obtained which cannot rule out LV thrombus. Pharmacy consulted to dose IV heparin. 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  3/22 AM: Heparin level remains <0.1 again, no issues with infusion per RN. Pt still having some mild rectal bleeding with known Crohns and acute colitis on admit, hemeoccult positive. Hgb stable at 8.4, PLT 278. Will increase heparin cautiously for now and target lower end of range.   Goal of Therapy:  Heparin level 0.3-0.5 units/ml Monitor platelets by anticoagulation protocol: Yes   Plan:  Increase heparin to 1800 units/h - titrate slowly for now Repeat heparin level in 6h F/u cMRI results and longterm AC plan  Enos Fling, PharmD PGY-1 Acute Care Pharmacy Resident 10/10/2023 9:54 AM

## 2023-10-10 NOTE — Plan of Care (Signed)
  Problem: Education: Goal: Knowledge of General Education information will improve Description: Including pain rating scale, medication(s)/side effects and non-pharmacologic comfort measures Outcome: Progressing   Problem: Health Behavior/Discharge Planning: Goal: Ability to manage health-related needs will improve Outcome: Progressing   Problem: Clinical Measurements: Goal: Ability to maintain clinical measurements within normal limits will improve Outcome: Progressing Goal: Will remain free from infection Outcome: Progressing   Problem: Activity: Goal: Risk for activity intolerance will decrease Outcome: Progressing   Problem: Nutrition: Goal: Adequate nutrition will be maintained Outcome: Progressing   Problem: Coping: Goal: Level of anxiety will decrease Outcome: Progressing   Problem: Elimination: Goal: Will not experience complications related to bowel motility Outcome: Progressing Goal: Will not experience complications related to urinary retention Outcome: Progressing   Problem: Pain Managment: Goal: General experience of comfort will improve and/or be controlled Outcome: Progressing   Problem: Safety: Goal: Ability to remain free from injury will improve Outcome: Progressing   Problem: Skin Integrity: Goal: Risk for impaired skin integrity will decrease Outcome: Progressing   Problem: Education: Goal: Ability to demonstrate management of disease process will improve Outcome: Progressing Goal: Ability to verbalize understanding of medication therapies will improve Outcome: Progressing   Problem: Activity: Goal: Capacity to carry out activities will improve Outcome: Progressing

## 2023-10-11 DIAGNOSIS — R079 Chest pain, unspecified: Secondary | ICD-10-CM | POA: Diagnosis not present

## 2023-10-11 LAB — CBC
HCT: 31.2 % — ABNORMAL LOW (ref 39.0–52.0)
Hemoglobin: 9.3 g/dL — ABNORMAL LOW (ref 13.0–17.0)
MCH: 20 pg — ABNORMAL LOW (ref 26.0–34.0)
MCHC: 29.8 g/dL — ABNORMAL LOW (ref 30.0–36.0)
MCV: 67.1 fL — ABNORMAL LOW (ref 80.0–100.0)
Platelets: 354 10*3/uL (ref 150–400)
RBC: 4.65 MIL/uL (ref 4.22–5.81)
RDW: 18.5 % — ABNORMAL HIGH (ref 11.5–15.5)
WBC: 5.8 10*3/uL (ref 4.0–10.5)
nRBC: 0 % (ref 0.0–0.2)

## 2023-10-11 LAB — BASIC METABOLIC PANEL WITH GFR
Anion gap: 9 (ref 5–15)
BUN: 19 mg/dL (ref 6–20)
CO2: 24 mmol/L (ref 22–32)
Calcium: 8.7 mg/dL — ABNORMAL LOW (ref 8.9–10.3)
Chloride: 101 mmol/L (ref 98–111)
Creatinine, Ser: 1.4 mg/dL — ABNORMAL HIGH (ref 0.61–1.24)
GFR, Estimated: >60 mL/min
Glucose, Bld: 118 mg/dL — ABNORMAL HIGH (ref 70–99)
Potassium: 4 mmol/L (ref 3.5–5.1)
Sodium: 134 mmol/L — ABNORMAL LOW (ref 135–145)

## 2023-10-11 LAB — CULTURE, BLOOD (ROUTINE X 2)

## 2023-10-11 LAB — HEPARIN LEVEL (UNFRACTIONATED)
Heparin Unfractionated: 0.15 [IU]/mL — ABNORMAL LOW (ref 0.30–0.70)
Heparin Unfractionated: 0.38 [IU]/mL (ref 0.30–0.70)

## 2023-10-11 MED ORDER — OXYCODONE HCL 5 MG PO TABS
10.0000 mg | ORAL_TABLET | ORAL | Status: AC | PRN
  Administered 2023-10-11 – 2023-10-12 (×4): 10 mg via ORAL
  Filled 2023-10-11 (×4): qty 2

## 2023-10-11 NOTE — Progress Notes (Signed)
 PROGRESS NOTE  Shaun [name] VHQ:[id number] DOB: [birth year] DOA: 10/07/2023 PCP: Maye Hides, PA   LOS: 3 days   Brief narrative:  Patient is a 38 year old male with past medical history of chronic heart failure with reduced ejection fraction, pulmonary embolism and DVT, MSSA bacteremia in October 2024, Crohn's disease, history of IV drug abuse presented to hospital with abdominal pain, chest pain nausea vomiting and was admitted under medical service.  Troponins were slightly elevated and flat.  BNP was elevated.  CT scan showed left-sided colitis but did not complain of abdominal pain.  Patient was started on Rocephin and Flagyl Cardiology was consulted and was admitted hospital for further evaluation and treatment.    Assessment/Plan: Principal Problem:   Chest pain Active Problems:   AKI (acute kidney injury) (HCC)   Non-ST elevation (NSTEMI) myocardial infarction (HCC)  History of Crohn's disease presented with abdominal pain/nausea/vomiting/acute colitis: Improved symptoms.  On Flagyl and Rocephin.  FOBT was positive.  CT scan without any acute findings to warrant GI follow-up.    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  At this time ACS has been ruled out.  CT angiogram was negative for PE.  Aspirin on hold due to anticoagulation    Acute on chronic chronic heart failure with reduced ejection fraction/left ventricular thrombus:  Patient with nonischemic cardiomyopathy since 2017 with EF of 25% status post AICD.  History of cocaine and methamphetamine usage.  2D echo cardiogram this admission with EF of less than 20% with LV thrombus.  Cardiology on board and on diuretics Aldactone digoxin.  Cardiac MRI report pending.  Has been started on heparin drip due to thrombus and concerns for bleeding.  Follow cardiology recommendations.  History of VT: Has [employer] ICD. Has been on both  amiodarone and mexiletine in the past, not on either at this time.    Substance abuse/IV drug abuse/history of MSSA bacteremia in October 2024.  He was doing self injected Suboxone while in prison.  Blood culture are growing staph epi dermatitis likely contaminant.   Severe iron deficiency anemia/hematochezia:  Plan for IV iron after cardiac MRI.  Hemoglobin today at 9.3 and has remained stable.    Hypokalemia: Resolved.  Latest potassium of 4.0.   Possible CKD stage IIIa: Last known creatinine is from August 2021 which was normal.  Presented with 1.94 this time which is improved to 1.4 today.  This appears to be his baseline.   DVT prophylaxis: On heparin drip.   Disposition: Home in 1 to 2 days.  Status is: Inpatient Remains inpatient appropriate because: Heparin drip, pending clinical improvement, diuretics, cardiac MRI, cardiology input, IV antibiotic    Code Status:     Code Status: Full Code  Family Communication: None at bedside  Consultants: Cardiology  Procedures: None  Anti-infectives:  Rocephin and metronidazole IV  Anti-infectives (From admission, onward)    Start     Dose/Rate Route Frequency Ordered Stop   10/08/23 1100  cefTRIAXone (ROCEPHIN) 2 g in sodium chloride 0.9 % 100 mL IVPB        2 g 200 mL/hr over 30 Minutes Intravenous Daily 10/08/23 1046     10/08/23 1100  metroNIDAZOLE (FLAGYL) IVPB 500 mg        500 mg 100 mL/hr over 60 Minutes Intravenous 2 times daily 10/08/23 1046          Subjective: Today, patient  was seen and examined at bedside.  Patient complains of back pain and requesting increased dose of medication.  Denies any shortness of breath, nausea, vomiting, fever, chills or rigor.  Objective: Vitals:   10/11/23 0738 10/11/23 0925  BP: 118/87   Pulse: 84 86  Resp: 16   Temp: 98 F (36.7 C)   SpO2: 98%     Intake/Output Summary (Last 24 hours) at 10/11/2023 1025 Last data filed at 10/11/2023 0706 Gross per 24 hour   Intake 1334.3 ml  Output --  Net 1334.3 ml   Filed Weights   10/07/23 1720  Weight: 72.6 kg   Body mass index is 21.7 kg/m.   Physical Exam:  GENERAL: Patient is alert awake and oriented. Not in obvious distress. HENT: No scleral pallor or icterus. Pupils equally reactive to light. Oral mucosa is moist NECK: is supple, no gross swelling noted. CHEST: Clear to auscultation. No crackles or wheezes.   CVS: S1 and S2 heard, no murmur. Regular rate and rhythm.  ABDOMEN: Soft, non-tender, bowel sounds are present. EXTREMITIES: No edema. CNS: Cranial nerves are intact. No focal motor deficits. SKIN: warm and dry without rashes.  Data Review: I have personally reviewed the following laboratory data and studies,  CBC: Recent Labs  Lab 10/08/23 0431 10/08/23 1833 10/09/23 0438 10/10/23 0754 10/11/23 0411  WBC 5.2 5.1 4.9 4.9 5.8  NEUTROABS  --  3.0 2.9  --   --   HGB 8.0* 8.6* 8.5* 8.4* 9.3*  HCT 27.5* 29.3* 29.0* 28.2* 31.2*  MCV 68.8* 67.8* 66.8* 66.8* 67.1*  PLT 327 307 357 278 354   Basic Metabolic Panel: Recent Labs  Lab 10/07/23 1843 10/07/23 1848 10/08/23 0431 10/09/23 0438 10/10/23 0754 10/11/23 0411  NA 135 140 136 133* 136 134*  K 4.4 4.5 3.2* 4.5 3.8 4.0  CL 103 106 101 106 103 101  CO2 21*  --  24 21* 24 24  GLUCOSE 124* 119* 96 103* 127* 118*  BUN 39* 36* 33* 32* 22* 19  CREATININE 1.94* 2.10* 1.63* 1.49* 1.40* 1.40*  CALCIUM 8.5*  --  7.8* 7.7* 8.2* 8.7*  MG  --   --  1.8  --   --   --   PHOS  --   --  4.0  --   --   --    Liver Function Tests: Recent Labs  Lab 10/07/23 1843 10/08/23 0431 10/09/23 0438  AST 26 21 29   ALT 14 12 15   ALKPHOS 72 68 94  BILITOT 1.5* 0.9 1.0  PROT 7.6 6.3* 6.5  ALBUMIN 3.3* 2.7* 2.7*   No results for input(s): "LIPASE", "AMYLASE" in the last 168 hours. No results for input(s): "AMMONIA" in the last 168 hours. Cardiac Enzymes: No results for input(s): "CKTOTAL", "CKMB", "CKMBINDEX", "TROPONINI" in the last  168 hours. BNP (last 3 results) Recent Labs    10/07/23 2312  BNP 1,830.7*    ProBNP (last 3 results) No results for input(s): "PROBNP" in the last 8760 hours.  CBG: No results for input(s): "GLUCAP" in the last 168 hours. Recent Results (from the past 240 hours)  Resp panel by RT-PCR (RSV, Flu A&B, Covid) Anterior Nasal Swab     Status: None   Collection Time: 10/07/23  7:33 PM   Specimen: Anterior Nasal Swab  Result Value Ref Range Status   SARS Coronavirus 2 by RT PCR NEGATIVE NEGATIVE Final   Influenza A by PCR NEGATIVE NEGATIVE Final   Influenza B by PCR  NEGATIVE NEGATIVE Final    Comment: (NOTE) The Xpert Xpress SARS-CoV-2/FLU/RSV plus assay is intended as an aid in the diagnosis of influenza from Nasopharyngeal swab specimens and should not be used as a sole basis for treatment. Nasal washings and aspirates are unacceptable for Xpert Xpress SARS-CoV-2/FLU/RSV testing.  Fact Sheet for Patients: BloggerCourse.com  Fact Sheet for Healthcare Providers: SeriousBroker.it  This test is not yet approved or cleared by the [place] FDA and has been authorized for detection and/or diagnosis of SARS-CoV-2 by FDA under an Emergency Use Authorization (EUA). This EUA will remain in effect (meaning this test can be used) for the duration of the COVID-19 declaration under Section 564(b)(1) of the Act, 21 U.S.C. section 360bbb-3(b)(1), unless the authorization is terminated or revoked.     Resp Syncytial Virus by PCR NEGATIVE NEGATIVE Final    Comment: (NOTE) Fact Sheet for Patients: BloggerCourse.com  Fact Sheet for Healthcare Providers: SeriousBroker.it  This test is not yet approved or cleared by the [place] FDA and has been authorized for detection and/or diagnosis of SARS-CoV-2 by FDA under an Emergency Use Authorization (EUA). This EUA will remain in effect  (meaning this test can be used) for the duration of the COVID-19 declaration under Section 564(b)(1) of the Act, 21 U.S.C. section 360bbb-3(b)(1), unless the authorization is terminated or revoked.  Performed at [facility], [address], [place], [place] [address]   Culture, blood (Routine X 2) w Reflex to ID Panel     Status: None (Preliminary result)   Collection Time: 10/08/23  4:31 AM   Specimen: BLOOD RIGHT ARM  Result Value Ref Range Status   Specimen Description BLOOD RIGHT ARM  Final   Special Requests   Final    BOTTLES DRAWN AEROBIC AND ANAEROBIC Blood Culture results may not be optimal due to an inadequate volume of blood received in culture bottles   Culture   Final    NO GROWTH 3 DAYS Performed at [facility], [address], [place], [place] [address]    Report Status PENDING  Incomplete  Culture, blood (Routine X 2) w Reflex to ID Panel     Status: Abnormal   Collection Time: 10/08/23  4:35 AM   Specimen: BLOOD LEFT ARM  Result Value Ref Range Status   Specimen Description BLOOD LEFT ARM  Final   Special Requests   Final    BOTTLES DRAWN AEROBIC AND ANAEROBIC Blood Culture results may not be optimal due to an inadequate volume of blood received in culture bottles   Culture  Setup Time   Final    GRAM POSITIVE COCCI IN CLUSTERS AEROBIC BOTTLE ONLY CRITICAL RESULT CALLED TO, READ BACK BY AND VERIFIED WITH: PHARMD J LEDFORD 10/09/2023 @ 0539 BY AB    Culture (A)  Final    STAPHYLOCOCCUS EPIDERMIDIS THE SIGNIFICANCE OF ISOLATING THIS ORGANISM FROM A SINGLE SET OF BLOOD CULTURES WHEN MULTIPLE SETS ARE DRAWN IS UNCERTAIN. PLEASE NOTIFY THE MICROBIOLOGY DEPARTMENT WITHIN ONE WEEK IF SPECIATION AND SENSITIVITIES ARE REQUIRED. Performed at [facility], [address], [place], [place] [address]    Report Status 10/11/2023 FINAL  Final  Blood Culture ID Panel (Reflexed)     Status: Abnormal   Collection Time: 10/08/23  4:35 AM  Result Value Ref  Range Status   Enterococcus faecalis NOT DETECTED NOT DETECTED Final   Enterococcus Faecium NOT DETECTED NOT DETECTED Final   Listeria monocytogenes NOT DETECTED NOT DETECTED Final  Staphylococcus species DETECTED (A) NOT DETECTED Final    Comment: CRITICAL RESULT CALLED TO, READ BACK BY AND VERIFIED WITH: PHARMD J LEDFORD 10/09/2023 @ 0539 BY AB    Staphylococcus aureus (BCID) NOT DETECTED NOT DETECTED Final   Staphylococcus epidermidis DETECTED (A) NOT DETECTED Final    Comment: Methicillin (oxacillin) resistant coagulase negative staphylococcus. Possible blood culture contaminant (unless isolated from more than one blood culture draw or clinical case suggests pathogenicity). No antibiotic treatment is indicated for blood  culture contaminants. CRITICAL RESULT CALLED TO, READ BACK BY AND VERIFIED WITH: PHARMD J LEDFORD 10/09/2023 @ 0539 BY AB    Staphylococcus lugdunensis NOT DETECTED NOT DETECTED Final   Streptococcus species NOT DETECTED NOT DETECTED Final   Streptococcus agalactiae NOT DETECTED NOT DETECTED Final   Streptococcus pneumoniae NOT DETECTED NOT DETECTED Final   Streptococcus pyogenes NOT DETECTED NOT DETECTED Final   A.calcoaceticus-baumannii NOT DETECTED NOT DETECTED Final   Bacteroides fragilis NOT DETECTED NOT DETECTED Final   Enterobacterales NOT DETECTED NOT DETECTED Final   Enterobacter cloacae complex NOT DETECTED NOT DETECTED Final   Escherichia coli NOT DETECTED NOT DETECTED Final   Klebsiella aerogenes NOT DETECTED NOT DETECTED Final   Klebsiella oxytoca NOT DETECTED NOT DETECTED Final   Klebsiella pneumoniae NOT DETECTED NOT DETECTED Final   Proteus species NOT DETECTED NOT DETECTED Final   Salmonella species NOT DETECTED NOT DETECTED Final   Serratia marcescens NOT DETECTED NOT DETECTED Final   Haemophilus influenzae NOT DETECTED NOT DETECTED Final   Neisseria meningitidis NOT DETECTED NOT DETECTED Final   Pseudomonas aeruginosa NOT DETECTED NOT DETECTED  Final   Stenotrophomonas maltophilia NOT DETECTED NOT DETECTED Final   Candida albicans NOT DETECTED NOT DETECTED Final   Candida auris NOT DETECTED NOT DETECTED Final   Candida glabrata NOT DETECTED NOT DETECTED Final   Candida krusei NOT DETECTED NOT DETECTED Final   Candida parapsilosis NOT DETECTED NOT DETECTED Final   Candida tropicalis NOT DETECTED NOT DETECTED Final   Cryptococcus neoformans/gattii NOT DETECTED NOT DETECTED Final   Methicillin resistance mecA/C DETECTED (A) NOT DETECTED Final    Comment: CRITICAL RESULT CALLED TO, READ BACK BY AND VERIFIED WITH: PHARMD J LEDFORD 10/09/2023 @ 0539 BY AB Performed at [facility], [address] [place], [place] [address]   MRSA Next Gen by PCR, Nasal     Status: Abnormal   Collection Time: 10/08/23  7:46 PM   Specimen: Nasal Mucosa; Nasal Swab  Result Value Ref Range Status   MRSA by PCR Next Gen DETECTED (A) NOT DETECTED Final    Comment: RESULT CALLED TO, READ BACK BY AND VERIFIED WITH: RN Hurley Cisco [phone] @ 2133 FH (NOTE) The GeneXpert MRSA Assay (FDA approved for NASAL specimens only), is one component of a comprehensive MRSA colonization surveillance program. It is not intended to diagnose MRSA infection nor to guide or monitor treatment for MRSA infections. Test performance is not FDA approved in patients less than 23 years old. Performed at [facility], [address] [place], [place] [address]      Studies: DG CHEST PORT 1 VIEW Result Date: 10/09/2023 CLINICAL DATA:  Acute on chronic systolic heart failure. EXAM: PORTABLE CHEST 1 VIEW COMPARISON:  Chest CTA 10/07/2023 FINDINGS: Dual lead left-sided pacemaker in place. The heart is enlarged. No features of pulmonary edema, large pleural effusion, focal airspace disease or pneumothorax. Costophrenic angles are not included in the field of view. IMPRESSION: Cardiomegaly without pulmonary edema. Electronically Signed  By: Narda Rutherford M.D.   On:  10/09/2023 15:52      Joycelyn Das, MD  [facility] 10/11/2023  If 7PM-7AM, please contact night-coverage

## 2023-10-11 NOTE — Progress Notes (Signed)
   Rounding Note    Patient Name: Shaun [name] Date of Encounter: 10/11/2023  [facility] Cardiologist: None   Subjective   NAEO. More abdominal pain this AM. Vital Signs    Vitals:   10/10/23 1929 10/11/23 0008 10/11/23 0549 10/11/23 0738  BP: 118/87 112/77 114/80 118/87  Pulse: 88 80 84 84  Resp: 18 20 16 16   Temp: 98.4 F (36.9 C) 98 F (36.7 C) 98.1 F (36.7 C) 98 F (36.7 C)  TempSrc: Oral Oral Oral Oral  SpO2: 99% 99% 98% 98%  Weight:      Height:        Intake/Output Summary (Last 24 hours) at 10/11/2023 0801 Last data filed at 10/11/2023 0706 Gross per 24 hour  Intake 1334.3 ml  Output --  Net 1334.3 ml      10/07/2023    5:20 PM 01/03/2020    8:08 AM 12/30/2019    7:50 PM  Last 3 Weights  Weight (lbs) 160 lb    Weight (kg) 72.576 kg       Information is confidential and restricted. Go to Review Flowsheets to unlock data.      Telemetry    Sinus with PVC's. - Personally Reviewed  ECG    Personally Reviewed  Physical Exam   GEN: No acute distress.   Cardiac: RRR, no murmurs, rubs, or gallops.  Respiratory: Clear to auscultation bilaterally. Psych: Normal affect   Assessment & Plan    #Acute on chronic systolic heart failure NYHA II-III. EF 25%. His volume status is improving. Start torsemide today Cont spironolactone Cont digoxin MRI pending  #CAD Cont statin No ischemic symptoms  #VT hx #ICD in situ Monitor  #?LV thrombus MRI pending Cont heparin gtt  #Bacteremia 1 out of 4 bottles. Likely contaminant given species and only 1 of 4    Shaun Lollis T. Lalla Brothers, MD, [facility], [facility] Cardiac Electrophysiology

## 2023-10-11 NOTE — Progress Notes (Addendum)
 PHARMACY - ANTICOAGULATION Pharmacy Consult for Heparin Indication: LV thrombus Brief A/P: Heparin level subtherapeutic Increase Heparin rate  Allergies  Allergen Reactions   Amoxicillin Swelling   Codeine Itching   Penicillins     Itching Did it involve swelling of the face/tongue/throat, SOB, or low BP? SOB & Swelling Which Caused Blood Clots Did it involve sudden or severe rash/hives, skin peeling, or any reaction on the inside of your mouth or nose? No Did you need to seek medical attention at a hospital or doctor's office? Y When did it last happen?  Over month ago     If all above answers are "NO", may proceed with cephalosporin use.     Ketorolac Itching and Other (See Comments)    nightmares    Patient Measurements: Height: 6' (182.9 cm) Weight: 72.6 kg (160 lb) IBW/kg (Calculated) : 77.6 Heparin Dosing Weight: 72.6 kg  Vital Signs: Temp: 98 F (36.7 C) (03/23 0008) Temp Source: Tympanic (03/23 0008) BP: 112/77 (03/23 0008) Pulse Rate: 80 (03/23 0008)  Labs: Recent Labs    10/08/23 2038 10/09/23 0438 10/09/23 1238 10/10/23 0754 10/10/23 1629 10/11/23 0411  HGB  --  8.5*  --  8.4*  --  9.3*  HCT  --  29.0*  --  28.2*  --  31.2*  PLT  --  357  --  278  --  354  APTT 34  --   --   --   --   --   LABPROT 17.3*  --   --   --   --   --   INR 1.4*  --   --   --   --   --   HEPARINUNFRC  --  <0.10*   < > <0.10* 0.12* 0.15*  CREATININE  --  1.49*  --  1.40*  --  1.40*   < > = values in this interval not displayed.    Estimated Creatinine Clearance: 73.5 mL/min (A) (by C-G formula based on SCr of 1.4 mg/dL (H)).  Assessment: 38 y.o. male with possible LV thrombus for heparin  Goal of Therapy:  Heparin level 0.3-0.5 units/ml Monitor platelets by anticoagulation protocol: Yes   Plan:  Increase heparin  2250 units/hr Check heparin level in 8 hours.   Geannie Risen, PharmD, BCPS  10/11/2023 5:31 AM

## 2023-10-11 NOTE — Progress Notes (Signed)
 At shift change, IV heparin noted to be turned off at pump.  Patient stated he turned it off because it was beeping and called nurse's station.  He stated it was off approximately 20-25 minutes.  New bag heparin administered, educated patient to call RN phone number on board if IV was alarming.  Patient voiced his understanding.

## 2023-10-11 NOTE — Progress Notes (Signed)
 PHARMACY - ANTICOAGULATION CONSULT NOTE  Pharmacy Consult for Heparin Indication: LV thrombus  Allergies  Allergen Reactions   Amoxicillin Swelling   Codeine Itching   Penicillins     Itching Did it involve swelling of the face/tongue/throat, SOB, or low BP? SOB & Swelling Which Caused Blood Clots Did it involve sudden or severe rash/hives, skin peeling, or any reaction on the inside of your mouth or nose? No Did you need to seek medical attention at a hospital or doctor's office? Y When did it last happen?  Over month ago     If all above answers are "NO", may proceed with cephalosporin use.     Ketorolac Itching and Other (See Comments)    nightmares    Patient Measurements: Height: 6' (182.9 cm) Weight: 72.6 kg (160 lb) IBW/kg (Calculated) : 77.6 Heparin Dosing Weight: 72.6 kg  Vital Signs: Temp: 98.1 F (36.7 C) (03/23 1124) Temp Source: Oral (03/23 1124) BP: 110/77 (03/23 1124) Pulse Rate: 81 (03/23 1124)  Labs: Recent Labs    10/08/23 2038 10/09/23 0438 10/09/23 1238 10/10/23 0754 10/10/23 1629 10/11/23 0411 10/11/23 1350  HGB  --  8.5*  --  8.4*  --  9.3*  --   HCT  --  29.0*  --  28.2*  --  31.2*  --   PLT  --  357  --  278  --  354  --   APTT 34  --   --   --   --   --   --   LABPROT 17.3*  --   --   --   --   --   --   INR 1.4*  --   --   --   --   --   --   HEPARINUNFRC  --  <0.10*   < > <0.10* 0.12* 0.15* 0.38  CREATININE  --  1.49*  --  1.40*  --  1.40*  --    < > = values in this interval not displayed.    Estimated Creatinine Clearance: 73.5 mL/min (A) (by C-G formula based on SCr of 1.4 mg/dL (H)).   Medical History: Past Medical History:  Diagnosis Date   Beta-hemolytic group B streptococcal sepsis (HCC) 10/2019   at [facility]   Chronic systolic CHF (congestive heart failure) (HCC) 12/30/2019   Coronary artery disease involving native coronary artery of native heart without angina pectoris 12/30/2019   Essential hypertension 12/30/2019    Hypertension    Intravenous drug abuse (HCC) 12/30/2019   Ischemic cardiomyopathy 12/30/2019   Mixed hyperlipidemia 12/30/2019   Nicotine dependence, cigarettes, uncomplicated 12/30/2019    Medications:  Scheduled:   Chlorhexidine Gluconate Cloth  6 each Topical Daily   digoxin  0.125 mg Oral Daily   mupirocin ointment  1 Application Nasal BID   spironolactone  12.5 mg Oral Daily   torsemide  40 mg Oral Daily   Infusions:   cefTRIAXone (ROCEPHIN)  IV 2 g (10/11/23 0926)   heparin 2,250 Units/hr (10/11/23 0706)   metronidazole 500 mg (10/11/23 0927)   PRN: melatonin, oxyCODONE, polyethylene glycol, prochlorperazine  Assessment: 38 yo male admitted with chest pain. Echo obtained which cannot rule out LV thrombus. Pharmacy consulted to dose IV heparin. 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  3/23 1400: Heparin level 0.38, therapeutic on heparin 2250 units/hr. No issues with infusion running per RN.  Pt still having some mild rectal bleeding with known Crohns and  acute colitis on admit, hemeoccult positive. Hgb increased to 9.3, PLT 354. Currently targeting a lower goal range.   Goal of Therapy:  Heparin level 0.3-0.5 units/ml Monitor platelets by anticoagulation protocol: Yes   Plan:  Continue heparin at 2250 units/hr Confirmatory heparin level in 6 hours F/u cMRI results and longterm AC plan  Enos Fling, PharmD PGY-1 Acute Care Pharmacy Resident 10/11/2023 2:53 PM

## 2023-10-12 ENCOUNTER — Other Ambulatory Visit (HOSPITAL_COMMUNITY): Payer: Self-pay

## 2023-10-12 DIAGNOSIS — I5023 Acute on chronic systolic (congestive) heart failure: Secondary | ICD-10-CM | POA: Diagnosis not present

## 2023-10-12 DIAGNOSIS — R079 Chest pain, unspecified: Secondary | ICD-10-CM | POA: Diagnosis not present

## 2023-10-12 LAB — CBC
HCT: 32.3 % — ABNORMAL LOW (ref 39.0–52.0)
Hemoglobin: 9.4 g/dL — ABNORMAL LOW (ref 13.0–17.0)
MCH: 19.7 pg — ABNORMAL LOW (ref 26.0–34.0)
MCHC: 29.1 g/dL — ABNORMAL LOW (ref 30.0–36.0)
MCV: 67.6 fL — ABNORMAL LOW (ref 80.0–100.0)
Platelets: 368 10*3/uL (ref 150–400)
RBC: 4.78 MIL/uL (ref 4.22–5.81)
RDW: 18.2 % — ABNORMAL HIGH (ref 11.5–15.5)
WBC: 5.3 10*3/uL (ref 4.0–10.5)
nRBC: 0 % (ref 0.0–0.2)

## 2023-10-12 LAB — BASIC METABOLIC PANEL
Anion gap: 6 (ref 5–15)
BUN: 16 mg/dL (ref 6–20)
CO2: 23 mmol/L (ref 22–32)
Calcium: 8.5 mg/dL — ABNORMAL LOW (ref 8.9–10.3)
Chloride: 106 mmol/L (ref 98–111)
Creatinine, Ser: 1.24 mg/dL (ref 0.61–1.24)
GFR, Estimated: >60 mL/min (ref >60)
Glucose, Bld: 104 mg/dL — ABNORMAL HIGH (ref 70–99)
Potassium: 4.5 mmol/L (ref 3.5–5.1)
Sodium: 135 mmol/L (ref 135–145)

## 2023-10-12 LAB — MAGNESIUM: Magnesium: 1.8 mg/dL (ref 1.7–2.4)

## 2023-10-12 LAB — HEPARIN LEVEL (UNFRACTIONATED): Heparin Unfractionated: 0.4 [IU]/mL (ref 0.30–0.70)

## 2023-10-12 MED ORDER — EMPAGLIFLOZIN 10 MG PO TABS
10.0000 mg | ORAL_TABLET | Freq: Every day | ORAL | Status: DC
  Administered 2023-10-12: 10 mg via ORAL
  Filled 2023-10-12: qty 1

## 2023-10-12 MED ORDER — POLYETHYLENE GLYCOL 3350 17 GM/SCOOP PO POWD
17.0000 g | Freq: Every day | ORAL | 0 refills | Status: AC | PRN
Start: 2023-10-12 — End: ?
  Filled 2023-10-12: qty 238, 14d supply, fill #0

## 2023-10-12 MED ORDER — TORSEMIDE 20 MG PO TABS: 20.0000 mg | ORAL_TABLET | Freq: Every day | ORAL | Status: DC

## 2023-10-12 MED ORDER — IRON SUCROSE 200 MG IVPB - SIMPLE MED
200.0000 mg | Freq: Once | Status: AC
  Administered 2023-10-12: 200 mg via INTRAVENOUS
  Filled 2023-10-12: qty 200

## 2023-10-12 MED ORDER — APIXABAN 5 MG PO TABS
5.0000 mg | ORAL_TABLET | Freq: Two times a day (BID) | ORAL | Status: DC
  Administered 2023-10-12: 5 mg via ORAL
  Filled 2023-10-12: qty 1

## 2023-10-12 MED ORDER — EMPAGLIFLOZIN 10 MG PO TABS
10.0000 mg | ORAL_TABLET | Freq: Every day | ORAL | 2 refills | Status: DC
  Filled 2023-10-12: qty 30, 30d supply, fill #0

## 2023-10-12 MED ORDER — MAGNESIUM SULFATE 2 GM/50ML IV SOLN
2.0000 g | Freq: Once | INTRAVENOUS | Status: DC
  Filled 2023-10-12: qty 50

## 2023-10-12 MED ORDER — OXYCODONE HCL 5 MG PO TABS
10.0000 mg | ORAL_TABLET | ORAL | Status: DC | PRN
  Administered 2023-10-12: 10 mg via ORAL
  Filled 2023-10-12: qty 2

## 2023-10-12 MED ORDER — DIGOXIN 125 MCG PO TABS
0.1250 mg | ORAL_TABLET | Freq: Every day | ORAL | 2 refills | Status: DC
  Filled 2023-10-12: qty 30, 30d supply, fill #0

## 2023-10-12 MED ORDER — TORSEMIDE 20 MG PO TABS
20.0000 mg | ORAL_TABLET | Freq: Every day | ORAL | 2 refills | Status: DC
  Filled 2023-10-12: qty 30, 30d supply, fill #0

## 2023-10-12 MED ORDER — APIXABAN 5 MG PO TABS
5.0000 mg | ORAL_TABLET | Freq: Two times a day (BID) | ORAL | 2 refills | Status: AC
  Filled 2023-10-12: qty 60, 30d supply, fill #0

## 2023-10-12 MED ORDER — OXYCODONE HCL 5 MG PO TABS
5.0000 mg | ORAL_TABLET | Freq: Four times a day (QID) | ORAL | 0 refills | Status: AC | PRN
  Filled 2023-10-12: qty 10, 3d supply, fill #0

## 2023-10-12 MED ORDER — SPIRONOLACTONE 25 MG PO TABS
12.5000 mg | ORAL_TABLET | Freq: Every day | ORAL | 2 refills | Status: DC
Start: 2023-10-13 — End: 2023-10-19
  Filled 2023-10-12: qty 15, 30d supply, fill #0

## 2023-10-12 NOTE — Discharge Summary (Signed)
 Physician Discharge Summary  Shaun [name] GNF:[id number] DOB: 03/01/1986 DOA: 10/07/2023  PCP: Maye Hides, PA  Admit date: 10/07/2023 Discharge date: 10/12/2023  Admitted From: Home  Discharge disposition: Home   Recommendations for Outpatient Follow-Up:   Follow up with your primary care provider in one week.  Check CBC, BMP, magnesium in the next visit Follow-up with cardiology as outpatient as scheduled with clinic.  Discharge Diagnosis:   Principal Problem:   Chest pain Active Problems:   AKI (acute kidney injury) (HCC)   Non-ST elevation (NSTEMI) myocardial infarction [facility])   Discharge Condition: Improved.  Diet recommendation: Low sodium, heart healthy.   Wound care: None.  Code status: Full.   History of Present Illness:   Patient is a 38 year old male with past medical history of chronic heart failure with reduced ejection fraction, pulmonary embolism and DVT, MSSA bacteremia in October 2024, Crohn's disease, history of IV drug abuse presented to hospital with abdominal pain, chest pain, nausea vomiting and was admitted under medical service.  Troponins were slightly elevated and flat.  BNP was elevated.  CT scan showed left-sided colitis but did not complain of abdominal pain.  Patient was started on Rocephin and Flagyl. Cardiology was consulted and was admitted hospital for further evaluation and treatment.    Hospital Course:   Following conditions were addressed during hospitalization as listed below,  History of Crohn's disease presented with abdominal pain/nausea/vomiting/acute colitis: Improved symptoms.  Received IV Flagyl and Rocephin.  FOBT was positive.  CT scan without any acute findings.  Currently stable.  Blood cultures negative in 4 days.   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   ACS has been ruled out.  CT angiogram was negative for PE.  Will  be continued on Eliquis on discharge.   Acute on chronic chronic heart failure with reduced ejection fraction/left ventricular thrombus:  Patient with nonischemic cardiomyopathy since 2017 with EF of 25% status post AICD.  History of cocaine and methamphetamine usage.  2D echo cardiogram this admission with EF of less than 20% with LV thrombus.  Cardiology on board and on diuretics, Aldactone digoxin.  Cardiac MRI showed severely dilated left ventricle with global hypokinesis and LV ejection fraction of 13% with no LV thrombus and moderate to severe MR and mid apical anterior and anteroseptal wall hypokinesis.  Patient was started on heparin drip due to thrombus and concerns for bleeding.  This will be transition to Eliquis on discharge.  Patient will follow-up with cardiology after discharge.  Cardiology has cleared the patient for discharge today.   History of VT: Has Med[employer]onic ICD. Has been on both amiodarone and mexiletine in the past, not on either at this time.    Substance abuse/IV drug abuse/history of MSSA bacteremia in October 2024.  He was doing self injected Suboxone while in prison.  Blood culture are growing staph epi dermatitis likely contaminant.  No leukocytosis or fever.   Severe iron deficiency anemia/hematochezia:   Hemoglobin today at 9.4 and has remained stable.    Hypokalemia: Resolved.  Latest potassium of 4.5.   Possible CKD stage IIIa:  Last known creatinine is from August 2021 which was normal.  Presented with 1.94 this time which is improved to 1.2 today.  This appears to be his baseline.   Disposition.  At this time, patient is stable for disposition home with outpatient PCP and cardiology follow-up.  Medical Consultants:   Cardiology   Procedures:    None  Subjective:   Today, patient was seen and examined at bedside.  The patient states that he complains of some sweating and some back pain.  Denies any nausea vomiting fever chills or rigor.  Denies any  dyspnea or shortness of breath.   Discharge Exam:   Vitals:   10/11/23 2304 10/12/23 0430  BP: 117/87 (!) 118/91  Pulse: 80 95  Resp: 16 18  Temp: 97.7 F (36.5 C) 98.6 F (37 C)  SpO2: 97% 99%   Vitals:   10/11/23 1739 10/11/23 2047 10/11/23 2304 10/12/23 0430  BP: 113/81 118/77 117/87 (!) 118/91  Pulse: 80 87 80 95  Resp: 16 18 16 18   Temp: 98.3 F (36.8 C) 98.3 F (36.8 C) 97.7 F (36.5 C) 98.6 F (37 C)  TempSrc: Oral Oral  Oral  SpO2: 98% 98% 97% 99%  Weight:      Height:       Body mass index is 21.7 kg/m.  General: Alert awake, not in obvious distress HENT: pupils equally reacting to light,  No scleral pallor or icterus noted. Oral mucosa is moist.  Chest:  Diminished breath sounds bilaterally. No crackles or wheezes.  CVS: S1 &S2 heard. No murmur.  Regular rate and rhythm. Abdomen: Soft, nontender, nondistended.  Bowel sounds are heard.   Extremities: No cyanosis, clubbing or edema.  Peripheral pulses are palpable. Psych: Alert, awake and oriented, normal mood CNS:  No cranial nerve deficits.  Power equal in all extremities.   Skin: Warm and dry.  No rashes noted.  The results of significant diagnostics from this hospitalization (including imaging, microbiology, ancillary and laboratory) are listed below for reference.     Diagnostic Studies:   ECHOCARDIOGRAM COMPLETE Result Date: 10/08/2023    ECHOCARDIOGRAM REPORT   Patient Name:   Shaun [name] Date of Exam: 10/08/2023 Medical Rec #:  [id number]        Height:       72.0 in Accession #:    [id number]       Weight:       160.0 lb Date of Birth:  [birth year]        BSA:          1.938 m Patient Age:    37 years         BP:           120/90 mmHg Patient Gender: M                HR:           90 bpm. Exam Location:  Inpatient Procedure: 2D Echo, Cardiac Doppler, Color Doppler and Intracardiac            Opacification Agent (Both Spectral and Color Flow Doppler were            utilized during procedure).  Indications:    Chest Pain  History:        Patient has no prior history of Echocardiogram examinations.                 Cardiomyopathy, CAD; Risk Factors:Hypertension.  Sonographer:    Melton Krebs RDCS, FE, PE Referring Phys: [id number] CAROLE N HALL IMPRESSIONS  1. Cannot rule out a left ventricular thromubs at the apex (image 82). Consider cMRI for further evaluation. Clincal correlation required. Left ventricular ejection fraction, by estimation, is <20%. The left ventricle has severely decreased function. The left  ventricle demonstrates global hypokinesis. The left ventricular internal cavity size was severely dilated. Left ventricular diastolic parameters are consistent with Grade III diastolic dysfunction (restrictive).  2. Right ventricular systolic function is mildly reduced. The right ventricular size is normal.  3. Left atrial size was moderately dilated.  4. Right atrial size was moderately dilated.  5. The mitral valve is grossly normal. Severe mitral valve regurgitation. No evidence of mitral stenosis.  6. The aortic valve is normal in structure. Aortic valve regurgitation is not visualized. No aortic stenosis is present. Comparison(s): A prior study was performed on 12/31/2019. Mild MR is now severe. Cannot rule out a left ventricular thromubs at the apex. Consider cMRI for further evaluation. Clincal correlation required. Conclusion(s)/Recommendation(s): Findings reported to Dr. Hughie Closs via secure chat (around 7:41pm) and he acknowledges the pertinent findings. Recommend cardiology consult. FINDINGS  Left Ventricle: Cannot rule out a left ventricular thromubs at the apex (image 82). Consider cMRI for further evaluation. Clincal correlation required. Left ventricular ejection fraction, by estimation, is <20%. The left ventricle has severely decreased  function. The left ventricle demonstrates global hypokinesis. The left ventricular internal cavity size was severely dilated. There is no left  ventricular hypertrophy. Left ventricular diastolic parameters are consistent with Grade III diastolic dysfunction (restrictive). Right Ventricle: The right ventricular size is normal. No increase in right ventricular wall thickness. Right ventricular systolic function is mildly reduced. Left Atrium: Left atrial size was moderately dilated. Right Atrium: Right atrial size was moderately dilated. Pericardium: There is no evidence of pericardial effusion. Mitral Valve: The mitral valve is grossly normal. Severe mitral valve regurgitation, with centrally-directed jet. No evidence of mitral valve stenosis. Tricuspid Valve: The tricuspid valve is grossly normal. Tricuspid valve regurgitation is mild . No evidence of tricuspid stenosis. Aortic Valve: The aortic valve is normal in structure. Aortic valve regurgitation is not visualized. No aortic stenosis is present. Aortic valve mean gradient measures 2.0 mmHg. Aortic valve peak gradient measures 3.4 mmHg. Aortic valve area, by VTI measures 3.05 cm. Pulmonic Valve: The pulmonic valve was normal in structure. Pulmonic valve regurgitation is mild. No evidence of pulmonic stenosis. Aorta: The aortic root and ascending aorta are structurally normal, with no evidence of dilitation. IAS/Shunts: The atrial septum is grossly normal. Additional Comments: A device lead is visualized.  LEFT VENTRICLE PLAX 2D LVIDd:         7.40 cm   Diastology LVIDs:         6.70 cm   LV e' medial:    6.85 cm/s LV PW:         1.20 cm   LV E/e' medial:  15.8 LV IVS:        0.80 cm   LV e' lateral:   12.30 cm/s LVOT diam:     2.20 cm   LV E/e' lateral: 8.8 LV SV:         51 LV SV Index:   26 LVOT Area:     3.80 cm  RIGHT VENTRICLE RV S prime:     7.62 cm/s TAPSE (M-mode): 1.5 cm LEFT ATRIUM             Index        RIGHT ATRIUM           Index LA diam:        4.90 cm 2.53 cm/m   RA Area:     23.40 cm LA Vol (A2C):   98.3 ml 50.73 ml/m  RA Volume:  77.70 ml  40.10 ml/m LA Vol (A4C):   66.2 ml  34.16 ml/m LA Biplane Vol: 81.9 ml 42.26 ml/m  AORTIC VALVE AV Area (Vmax):    3.31 cm AV Area (Vmean):   3.19 cm AV Area (VTI):     3.05 cm AV Vmax:           91.70 cm/s AV Vmean:          61.400 cm/s AV VTI:            0.168 m AV Peak Grad:      3.4 mmHg AV Mean Grad:      2.0 mmHg LVOT Vmax:         79.80 cm/s LVOT Vmean:        51.600 cm/s LVOT VTI:          0.135 m LVOT/AV VTI ratio: 0.80  AORTA Ao Root diam: 3.50 cm Ao Asc diam:  2.90 cm MITRAL VALVE                TRICUSPID VALVE MV Area (PHT): 4.36 cm     TR Peak grad:   20.1 mmHg MV Decel Time: 174 msec     TR Vmax:        224.00 cm/s MV E velocity: 108.00 cm/s MV A velocity: 47.50 cm/s   SHUNTS MV E/A ratio:  2.27         Systemic VTI:  0.14 m                             Systemic Diam: 2.20 cm Sunit Tolia Electronically signed by Tessa Lerner Signature Date/Time: 10/08/2023/8:04:07 PM    Final    CT ABDOMEN PELVIS W CONTRAST Result Date: 10/07/2023 CLINICAL DATA:  Crohn's exacerbation. EXAM: CT ABDOMEN AND PELVIS WITH CONTRAST TECHNIQUE: Multidetector CT imaging of the abdomen and pelvis was performed using the standard protocol following bolus administration of intravenous contrast. RADIATION DOSE REDUCTION: This exam was performed according to the departmental dose-optimization program which includes automated exposure control, adjustment of the mA and/or kV according to patient size and/or use of iterative reconstruction technique. CONTRAST:  65mL OMNIPAQUE IOHEXOL 350 MG/ML SOLN COMPARISON:  October 13, 2022 FINDINGS: Lower chest: No acute abnormality. Hepatobiliary: No focal liver abnormality is seen. No gallstones, gallbladder wall thickening, or biliary dilatation. Pancreas: Unremarkable. No pancreatic ductal dilatation or surrounding inflammatory changes. Spleen: Normal in size without focal abnormality. Adrenals/Urinary Tract: Adrenal glands are unremarkable. Kidneys are normal in size, without renal calculi or hydronephrosis. A 2.0 cm  diameter simple cyst is seen within the lower pole of the left kidney. Bladder is unremarkable. Stomach/Bowel: Stomach is within normal limits. The appendix is not clearly identified. A large stool burden is noted throughout the colon. No evidence of bowel dilatation. The descending colon is mildly thickened and inflamed. Mild thickening of a poorly distended sigmoid colon is also noted. Vascular/Lymphatic: No significant vascular findings are present. No enlarged abdominal or pelvic lymph nodes. Reproductive: Prostate is unremarkable. Other: No abdominal wall hernia or abnormality. No abdominopelvic ascites. Musculoskeletal: No acute or significant osseous findings. IMPRESSION: 1. Mild colitis involving the descending and sigmoid colon. 2. Large stool burden without evidence of bowel obstruction. 3. 2.0 cm diameter simple cyst within the lower pole of the left kidney. No follow-up imaging is recommended. This recommendation follows ACR consensus guidelines: Management of the Incidental Renal Mass on CT: A White Paper of the ACR  Incidental Findings Committee. J Am Coll Radiol [phone]. Electronically Signed   By: Aram Candela M.D.   On: 10/07/2023 20:54   CT Angio Chest PE W and/or Wo Contrast Result Date: 10/07/2023 CLINICAL DATA:  Suspected pulmonary embolism. EXAM: CT ANGIOGRAPHY CHEST WITH CONTRAST TECHNIQUE: Multidetector CT imaging of the chest was performed using the standard protocol during bolus administration of intravenous contrast. Multiplanar CT image reconstructions and MIPs were obtained to evaluate the vascular anatomy. RADIATION DOSE REDUCTION: This exam was performed according to the departmental dose-optimization program which includes automated exposure control, adjustment of the mA and/or kV according to patient size and/or use of iterative reconstruction technique. CONTRAST:  65mL OMNIPAQUE IOHEXOL 350 MG/ML SOLN COMPARISON:  None Available. FINDINGS: Cardiovascular: A dual lead  AICD is noted. Satisfactory opacification of the pulmonary arteries to the segmental level. No evidence of pulmonary embolism. Normal heart size. No pericardial effusion. Mediastinum/Nodes: No enlarged mediastinal, hilar, or axillary lymph nodes. Thyroid gland, trachea, and esophagus demonstrate no significant findings. Lungs/Pleura: Lungs are clear. No pleural effusion or pneumothorax. Upper Abdomen: No acute abnormality. Musculoskeletal: No chest wall abnormality. No acute or significant osseous findings. Review of the MIP images confirms the above findings. IMPRESSION: No evidence of pulmonary embolism or other acute intrathoracic process. Electronically Signed   By: Aram Candela M.D.   On: 10/07/2023 20:46     Labs:   Basic Metabolic Panel: Recent Labs  Lab 10/08/23 0431 10/09/23 0438 10/10/23 0754 10/11/23 0411 10/12/23 0459  NA 136 133* 136 134* 135  K 3.2* 4.5 3.8 4.0 4.5  CL 101 106 103 101 106  CO2 24 21* 24 24 23   GLUCOSE 96 103* 127* 118* 104*  BUN 33* 32* 22* 19 16  CREATININE 1.63* 1.49* 1.40* 1.40* 1.24  CALCIUM 7.8* 7.7* 8.2* 8.7* 8.5*  MG 1.8  --   --   --  1.8  PHOS 4.0  --   --   --   --    GFR Estimated Creatinine Clearance: 82.9 mL/min (by C-G formula based on SCr of 1.24 mg/dL). Liver Function Tests: Recent Labs  Lab 10/07/23 1843 10/08/23 0431 10/09/23 0438  AST 26 21 29   ALT 14 12 15   ALKPHOS 72 68 94  BILITOT 1.5* 0.9 1.0  PROT 7.6 6.3* 6.5  ALBUMIN 3.3* 2.7* 2.7*   No results for input(s): "LIPASE", "AMYLASE" in the last 168 hours. No results for input(s): "AMMONIA" in the last 168 hours. Coagulation profile Recent Labs  Lab 10/08/23 2038  INR 1.4*    CBC: Recent Labs  Lab 10/08/23 1833 10/09/23 0438 10/10/23 0754 10/11/23 0411 10/12/23 0459  WBC 5.1 4.9 4.9 5.8 5.3  NEUTROABS 3.0 2.9  --   --   --   HGB 8.6* 8.5* 8.4* 9.3* 9.4*  HCT 29.3* 29.0* 28.2* 31.2* 32.3*  MCV 67.8* 66.8* 66.8* 67.1* 67.6*  PLT 307 357 278 354 368    Cardiac Enzymes: No results for input(s): "CKTOTAL", "CKMB", "CKMBINDEX", "TROPONINI" in the last 168 hours. BNP: Invalid input(s): "POCBNP" CBG: No results for input(s): "GLUCAP" in the last 168 hours. D-Dimer No results for input(s): "DDIMER" in the last 72 hours. Hgb A1c No results for input(s): "HGBA1C" in the last 72 hours. Lipid Profile No results for input(s): "CHOL", "HDL", "LDLCALC", "TRIG", "CHOLHDL", "LDLDIRECT" in the last 72 hours. Thyroid function studies No results for input(s): "TSH", "T4TOTAL", "T3FREE", "THYROIDAB" in the last 72 hours.  Invalid input(s): "FREET3" Anemia work up No results for input(s): "  VITAMINB12", "FOLATE", "FERRITIN", "TIBC", "IRON", "RETICCTPCT" in the last 72 hours. Microbiology Recent Results (from the past 240 hours)  Resp panel by RT-PCR (RSV, Flu A&B, Covid) Anterior Nasal Swab     Status: None   Collection Time: 10/07/23  7:33 PM   Specimen: Anterior Nasal Swab  Result Value Ref Range Status   SARS Coronavirus 2 by RT PCR NEGATIVE NEGATIVE Final   Influenza A by PCR NEGATIVE NEGATIVE Final   Influenza B by PCR NEGATIVE NEGATIVE Final    Comment: (NOTE) The Xpert Xpress SARS-CoV-2/FLU/RSV plus assay is intended as an aid in the diagnosis of influenza from Nasopharyngeal swab specimens and should not be used as a sole basis for treatment. Nasal washings and aspirates are unacceptable for Xpert Xpress SARS-CoV-2/FLU/RSV testing.  Fact Sheet for Patients: BloggerCourse.com  Fact Sheet for Healthcare Providers: SeriousBroker.it  This test is not yet approved or cleared by the [place] FDA and has been authorized for detection and/or diagnosis of SARS-CoV-2 by FDA under an Emergency Use Authorization (EUA). This EUA will remain in effect (meaning this test can be used) for the duration of the COVID-19 declaration under Section 564(b)(1) of the Act, 21 U.S.C. section  360bbb-3(b)(1), unless the authorization is terminated or revoked.     Resp Syncytial Virus by PCR NEGATIVE NEGATIVE Final    Comment: (NOTE) Fact Sheet for Patients: BloggerCourse.com  Fact Sheet for Healthcare Providers: SeriousBroker.it  This test is not yet approved or cleared by the [place] FDA and has been authorized for detection and/or diagnosis of SARS-CoV-2 by FDA under an Emergency Use Authorization (EUA). This EUA will remain in effect (meaning this test can be used) for the duration of the COVID-19 declaration under Section 564(b)(1) of the Act, 21 U.S.C. section 360bbb-3(b)(1), unless the authorization is terminated or revoked.  Performed at [facility], [address] [place], [place] [address]   Culture, blood (Routine X 2) w Reflex to ID Panel     Status: None (Preliminary result)   Collection Time: 10/08/23  4:31 AM   Specimen: BLOOD RIGHT ARM  Result Value Ref Range Status   Specimen Description BLOOD RIGHT ARM  Final   Special Requests   Final    BOTTLES DRAWN AEROBIC AND ANAEROBIC Blood Culture results may not be optimal due to an inadequate volume of blood received in culture bottles   Culture   Final    NO GROWTH 4 DAYS Performed at [facility], [address] [place], [place] [address]    Report Status PENDING  Incomplete  Culture, blood (Routine X 2) w Reflex to ID Panel     Status: Abnormal   Collection Time: 10/08/23  4:35 AM   Specimen: BLOOD LEFT ARM  Result Value Ref Range Status   Specimen Description BLOOD LEFT ARM  Final   Special Requests   Final    BOTTLES DRAWN AEROBIC AND ANAEROBIC Blood Culture results may not be optimal due to an inadequate volume of blood received in culture bottles   Culture  Setup Time   Final    GRAM POSITIVE COCCI IN CLUSTERS AEROBIC BOTTLE ONLY CRITICAL RESULT CALLED TO, READ BACK BY AND VERIFIED WITH: PHARMD J LEDFORD 10/09/2023 @ 0539 BY  AB    Culture (A)  Final    STAPHYLOCOCCUS EPIDERMIDIS THE SIGNIFICANCE OF ISOLATING THIS ORGANISM FROM A SINGLE SET OF BLOOD CULTURES WHEN MULTIPLE SETS ARE DRAWN IS UNCERTAIN. PLEASE NOTIFY THE MICROBIOLOGY DEPARTMENT WITHIN ONE WEEK  IF SPECIATION AND SENSITIVITIES ARE REQUIRED. Performed at [facility], [address] [place], [place] [address]    Report Status 10/11/2023 FINAL  Final  Blood Culture ID Panel (Reflexed)     Status: Abnormal   Collection Time: 10/08/23  4:35 AM  Result Value Ref Range Status   Enterococcus faecalis NOT DETECTED NOT DETECTED Final   Enterococcus Faecium NOT DETECTED NOT DETECTED Final   Listeria monocytogenes NOT DETECTED NOT DETECTED Final   Staphylococcus species DETECTED (A) NOT DETECTED Final    Comment: CRITICAL RESULT CALLED TO, READ BACK BY AND VERIFIED WITH: PHARMD J LEDFORD 10/09/2023 @ 0539 BY AB    Staphylococcus aureus (BCID) NOT DETECTED NOT DETECTED Final   Staphylococcus epidermidis DETECTED (A) NOT DETECTED Final    Comment: Methicillin (oxacillin) resistant coagulase negative staphylococcus. Possible blood culture contaminant (unless isolated from more than one blood culture draw or clinical case suggests pathogenicity). No antibiotic treatment is indicated for blood  culture contaminants. CRITICAL RESULT CALLED TO, READ BACK BY AND VERIFIED WITH: PHARMD J LEDFORD 10/09/2023 @ 0539 BY AB    Staphylococcus lugdunensis NOT DETECTED NOT DETECTED Final   Streptococcus species NOT DETECTED NOT DETECTED Final   Streptococcus agalactiae NOT DETECTED NOT DETECTED Final   Streptococcus pneumoniae NOT DETECTED NOT DETECTED Final   Streptococcus pyogenes NOT DETECTED NOT DETECTED Final   A.calcoaceticus-baumannii NOT DETECTED NOT DETECTED Final   Bacteroides fragilis NOT DETECTED NOT DETECTED Final   Enterobacterales NOT DETECTED NOT DETECTED Final   Enterobacter cloacae complex NOT DETECTED NOT DETECTED Final   Escherichia coli NOT  DETECTED NOT DETECTED Final   Klebsiella aerogenes NOT DETECTED NOT DETECTED Final   Klebsiella oxytoca NOT DETECTED NOT DETECTED Final   Klebsiella pneumoniae NOT DETECTED NOT DETECTED Final   Proteus species NOT DETECTED NOT DETECTED Final   Salmonella species NOT DETECTED NOT DETECTED Final   Serratia marcescens NOT DETECTED NOT DETECTED Final   Haemophilus influenzae NOT DETECTED NOT DETECTED Final   Neisseria meningitidis NOT DETECTED NOT DETECTED Final   Pseudomonas aeruginosa NOT DETECTED NOT DETECTED Final   Stenotrophomonas maltophilia NOT DETECTED NOT DETECTED Final   Candida albicans NOT DETECTED NOT DETECTED Final   Candida auris NOT DETECTED NOT DETECTED Final   Candida glabrata NOT DETECTED NOT DETECTED Final   Candida krusei NOT DETECTED NOT DETECTED Final   Candida parapsilosis NOT DETECTED NOT DETECTED Final   Candida tropicalis NOT DETECTED NOT DETECTED Final   Cryptococcus neoformans/gattii NOT DETECTED NOT DETECTED Final   Methicillin resistance mecA/C DETECTED (A) NOT DETECTED Final    Comment: CRITICAL RESULT CALLED TO, READ BACK BY AND VERIFIED WITH: PHARMD J LEDFORD 10/09/2023 @ 0539 BY AB Performed at [facility], [address], [place], [place] [address]   MRSA Next Gen by PCR, Nasal     Status: Abnormal   Collection Time: 10/08/23  7:46 PM   Specimen: Nasal Mucosa; Nasal Swab  Result Value Ref Range Status   MRSA by PCR Next Gen DETECTED (A) NOT DETECTED Final    Comment: RESULT CALLED TO, READ BACK BY AND VERIFIED WITH: RN Hurley Cisco [phone] @ 2133 FH (NOTE) The GeneXpert MRSA Assay (FDA approved for NASAL specimens only), is one component of a comprehensive MRSA colonization surveillance program. It is not intended to diagnose MRSA infection nor to guide or monitor treatment for MRSA infections. Test performance is not FDA approved in patients less than 64 years old. Performed at [facility]  Lab, [address] [place], [place] [address]       Discharge Instructions:   Discharge Instructions     Call MD for:  persistant nausea and vomiting   Complete by: As directed    Call MD for:  severe uncontrolled pain   Complete by: As directed    Call MD for:  temperature >100.4   Complete by: As directed    Diet - low sodium heart healthy   Complete by: As directed    Discharge instructions   Complete by: As directed    Follow-up with your primary care provider in 1 week.  Check blood work at that time.  Follow-up with cardiology as outpatient as scheduled by the clinic.  Seek medical attention for worsening symptoms.  Please avoid illicit substances.   Increase activity slowly   Complete by: As directed       Allergies as of 10/12/2023       Reactions   Amoxicillin Swelling   Codeine Itching   Penicillins    Itching Did it involve swelling of the face/tongue/throat, SOB, or low BP? SOB & Swelling Which Caused Blood Clots Did it involve sudden or severe rash/hives, skin peeling, or any reaction on the inside of your mouth or nose? No Did you need to seek medical attention at a hospital or doctor's office? Y When did it last happen?  Over month ago     If all above answers are "NO", may proceed with cephalosporin use.   Ketorolac Itching, Other (See Comments)   nightmares        Medication List     STOP taking these medications    aspirin EC 81 MG tablet       TAKE these medications    apixaban 5 MG Tabs tablet Commonly known as: ELIQUIS Take 1 tablet (5 mg total) by mouth 2 (two) times daily.   carvedilol 3.125 MG tablet Commonly known as: COREG Take 1 tablet (3.125 mg total) by mouth 2 (two) times daily with a meal.   digoxin 0.125 MG tablet Commonly known as: LANOXIN Take 1 tablet (0.125 mg total) by mouth daily. Start taking on: October 13, 2023   empagliflozin 10 MG Tabs tablet Commonly known as: JARDIANCE Take 1 tablet (10 mg total) by mouth daily.   KEPPRA PO Take 1 tablet by mouth  daily.   oxyCODONE 5 MG immediate release tablet Commonly known as: Oxy IR/ROXICODONE Take 1 tablet (5 mg total) by mouth every 6 (six) hours as needed for up to 5 days for breakthrough pain or severe pain (pain score 7-10).   polyethylene glycol 17 g packet Commonly known as: MIRALAX / GLYCOLAX Take 17 g by mouth daily as needed for mild constipation.   spironolactone 25 MG tablet Commonly known as: ALDACTONE Take 0.5 tablets (12.5 mg total) by mouth daily. Start taking on: October 13, 2023   torsemide 20 MG tablet Commonly known as: DEMADEX Take 1 tablet (20 mg total) by mouth daily. Start taking on: October 13, 2023        Follow-up Information     [facility] Follow up on 10/19/2023.   Specialty: Cardiology Why: Advanced Heart Failure Clinic 12 PM Entrance C, Free Valet Parking Contact information: [address] [place] [address] [phone]        Maye Hides, PA Follow up in 1 week(s).   Specialty: Physician Assistant Contact information: [phone]  [place] [address] [phone]                  Time coordinating discharge: 39 minutes  Signed:  Lavilla Delamora  [facility] 10/12/2023, 11:21 AM

## 2023-10-12 NOTE — TOC Transition Note (Signed)
 Transition of Care [facility]) - Discharge Note   Patient Details  Name: Shaun [name] MRN: [id number] Date of Birth: 04-Oct-1985  Transition of Care [facility]) CM/SW Contact:  Reva Bores, LCSWA Phone Number: 10/12/2023, 11:15 AM   Clinical Narrative:   11:10 AM- HF CSW called patients PCP to schedule his hospital follow up appointment but could not since patient had something going with his account. Penni Bombard, from [facility] stated that they would contact the patient directly to schedule his appt.   11:17 AM- HF CSW called and spoke with pt over the phone who stated that his stepfather was providing transportation home today. CSW also spoke with patient about upcoming appts. And he stated that he would call his PCP and get it taken care of.       Barriers to Discharge: Continued Medical Work up   Patient Goals and CMS Choice Patient states their goals for this hospitalization and ongoing recovery are:: get healthy          Discharge Placement                       Discharge Plan and Services Additional resources added to the After Visit Summary for                                       Social Drivers of Health (SDOH) Interventions SDOH Screenings   Food Insecurity: No Food Insecurity (10/08/2023)  Housing: Low Risk  (10/08/2023)  Transportation Needs: No Transportation Needs (10/08/2023)  Utilities: Not At Risk (10/08/2023)  Financial Resource Strain: High Risk (05/19/2023)   Received from [facility]  Social Connections: Unknown (07/02/2022)   Received from [facility]  Tobacco Use: High Risk (10/07/2023)     Readmission Risk Interventions     No data to display

## 2023-10-12 NOTE — Progress Notes (Signed)
 Pt discharged to DC lounge in stable condition. Discharge instructions given. Pt to pick up meds from [facility] pharmacy. No immediate questions or concerns at this time.

## 2023-10-12 NOTE — Progress Notes (Addendum)
 Advanced Heart Failure Rounding Note  Cardiologist: None  Chief Complaint:  Subjective:   cMRI 3/21 with severely dilated LV with global HKS LVEF 13%. No LV thrombus seen though images limited by artifact. Mildly dilated RV with mod HKS. Mod-severe MR. LGE in mid-apical anterior and anteroseptal walls suggestive of prior MI.   Daily weights ordered this am.   Feels good this morning. Sitting on EOB.   Objective:   Weight Range: 72.6 kg Body mass index is 21.7 kg/m.   Vital Signs:   Temp:  [97.7 F (36.5 C)-98.6 F (37 C)] 98.6 F (37 C) (03/24 0430) Pulse Rate:  [80-95] 95 (03/24 0430) Resp:  [16-18] 18 (03/24 0430) BP: (110-118)/(77-91) 118/91 (03/24 0430) SpO2:  [96 %-99 %] 99 % (03/24 0430) Last BM Date : 10/10/23  Weight change: Filed Weights   10/07/23 1720  Weight: 72.6 kg    Intake/Output:   Intake/Output Summary (Last 24 hours) at 10/12/2023 0810 Last data filed at 10/12/2023 0500 Gross per 24 hour  Intake 1167.5 ml  Output --  Net 1167.5 ml    Physical Exam    General:  well appearing.  No respiratory difficulty HEENT: normal Neck: supple. JVD ~6 cm.  Cor: PMI nondisplaced. Regular rate & rhythm. No rubs, gallops or murmurs. Lungs: clear Abdomen: soft, nontender, nondistended. Good bowel sounds. Extremities: no cyanosis, clubbing, rash, edema  Neuro: alert & oriented x 3. Moves all 4 extremities w/o difficulty. Affect pleasant.   Telemetry   ST low 100s (Personally reviewed)    EKG    No new EKG to review  Labs    CBC Recent Labs    10/11/23 0411 10/12/23 0459  WBC 5.8 5.3  HGB 9.3* 9.4*  HCT 31.2* 32.3*  MCV 67.1* 67.6*  PLT 354 368   Basic Metabolic Panel Recent Labs    09/81/19 0411 10/12/23 0459  NA 134* 135  K 4.0 4.5  CL 101 106  CO2 24 23  GLUCOSE 118* 104*  BUN 19 16  CREATININE 1.40* 1.24  CALCIUM 8.7* 8.5*  MG  --  1.8   Liver Function Tests No results for input(s): "AST", "ALT", "ALKPHOS", "BILITOT",  "PROT", "ALBUMIN" in the last 72 hours. No results for input(s): "LIPASE", "AMYLASE" in the last 72 hours. Cardiac Enzymes No results for input(s): "CKTOTAL", "CKMB", "CKMBINDEX", "TROPONINI" in the last 72 hours.  BNP: BNP (last 3 results) Recent Labs    10/07/23 2312  BNP 1,830.7*    ProBNP (last 3 results) No results for input(s): "PROBNP" in the last 8760 hours.   D-Dimer No results for input(s): "DDIMER" in the last 72 hours. Hemoglobin A1C No results for input(s): "HGBA1C" in the last 72 hours. Fasting Lipid Panel No results for input(s): "CHOL", "HDL", "LDLCALC", "TRIG", "CHOLHDL", "LDLDIRECT" in the last 72 hours. Thyroid Function Tests No results for input(s): "TSH", "T4TOTAL", "T3FREE", "THYROIDAB" in the last 72 hours.  Invalid input(s): "FREET3"  Other results:   Imaging    No results found.   Medications:     Scheduled Medications:  Chlorhexidine Gluconate Cloth  6 each Topical Daily   digoxin  0.125 mg Oral Daily   mupirocin ointment  1 Application Nasal BID   spironolactone  12.5 mg Oral Daily   torsemide  40 mg Oral Daily    Infusions:  cefTRIAXone (ROCEPHIN)  IV 2 g (10/11/23 0926)   heparin 2,250 Units/hr (10/12/23 0726)   metronidazole 500 mg (10/11/23 2216)    PRN  Medications: melatonin, oxyCODONE, polyethylene glycol, prochlorperazine  Patient Profile   38 y.o. male with history of CAD, ICM/chronic systolic CHF s/p ICD, VT, hx IV drug abuse, hx DVT/PE, chronic Hep C, MSSA bactermia 10/24, Crohn's disease. Admitted with chest pain, N, V, acute colitis.   Assessment/Plan  Acute on chronic systolic CHF Mixed iCM/ICM. S/p ICD 10/24 -EF has been < 20% for several years. Echo this admit: EF < 20%, cannot rule out LV apical thrombus, RV mildly reduced, severe MR -cMRI 3/21 with severely dilated LV with global HKS LVEF 13%. No LV thrombus seen though images limited by artifact. Mildly dilated RV with mod HKS. Mod-severe MR. LGE in  mid-apical anterior and anteroseptal walls suggestive of prior MI.  -NYHA III. Appears euvolemic on exam today. Start Torsemide 20 mg daily  -Continue spiro 12.5 mg daily -Continue digoxin 0.125 mg daily -Restart home Jardiance 10 mg daily  2. CAD -Hx anterior STEMI in 2017 in setting of cocaine, heroin and tobacco use. Had PTCA/DES to ostial/prox LAD and mid LAD, PTCA to D1 -Continue hep gtt - Continue statin -HS troponin mildly elevated with flat trend   3. MR -Severe on echo -Mod-severe on cMRI   4. Hx VT -S/p ICD placement  -Amiodarone previously stopped d/t elevated LFTs in setting of septic shock -Later on mexiletine but stopped at least a few months ago   5. Possible LV thrombus -Echo as above -On heparin gtt. No evidence of thrombus on cMRI. Will stop heparin gtt and transition to Eliquis 5 mg BID at least for 3 months with low EF. Hgb stable today.    5. Chronic Anemia  Melena - Iron stores are low. OK for feraheme today - hemoccult +   6. Abdominal pain N, V -Evidence of colitis on CT -On ceftriaxone and metronidazole per primary - 1/4 BC + for staph epidermidis. Felt to be contaminant -Consider GI evaluation   7. Polysubstance abuse -Hx IV drug use -Cocaine, Methamphetamine, Heroin, Tobacco -Denies recent drug use other than suboxone -UDS + for amphetamines and opiates  8. Bacteremia - 1/4 bottles - suspect contaminant  Follows with Dr. Tollie Pizza at [facility]. Has f/u arranged in AHF clinic here.   AHF meds at discharge:  Eliquis 5 mg BID Digoxin 0.125 mg daily Jardiance 10 mg daily Spiro 12.5 mg daily Torsemide 20 mg daily   Length of Stay: 4  Alen Bleacher, NP  10/12/2023, 8:10 AM  Advanced Heart Failure Team Pager [phone] (M-F; 7a - 5p)  Please contact [facility] for night-coverage after hours (5p -7a ) and weekends on [email or web address]  Patient seen and examined with the above-signed Advanced Practice Provider and/or Housestaff. I personally  reviewed laboratory data, imaging studies and relevant notes. I independently examined the patient and formulated the important aspects of the plan. I have edited the note to reflect any of my changes or salient points. I have personally discussed the plan with the patient and/or family.  Feels good. Wants to go home. Denies CP or SOB   cMRI reviewed, EF 13% previous LAD infarct. No obvious LV clot but apical AK  General: Lying flat in bed  No resp difficulty HEENT: normal Neck: supple. no JVD. Carotids 2+ bilat; no bruits. No lymphadenopathy or thryomegaly appreciated. Cor: PMI nondisplaced. Regular rate & rhythm. No rubs, gallops or murmurs. Lungs: clear Abdomen: soft, nontender, nondistended. No hepatosplenomegaly. No bruits or masses. Good bowel sounds. Extremities: no cyanosis, clubbing, rash, edema  multiple tattoos Neuro: alert &  orientedx3, cranial nerves grossly intact. moves all 4 extremities w/o difficulty. Affect pleasant  Volume status improved. Hemodynamically stable. cMRI results reviewed with him personally. Although there is nor frank apical clot given severe LV dysfunction and apical AK would recommend anticoagulation with Eliquis for stroke prevention unless GI situation prohibits. He is in agreement.  Ok for d/c from our standpoint. He would like to f/u with Dr. Reuel Boom in [place].   The HF team will s/o   Arvilla Meres, MD  9:36 AM

## 2023-10-12 NOTE — Progress Notes (Signed)
 [facility] night cross cover note:   I was notified by RN that this patient's order for oxycodone 10 mg po q4 hours prn , has now expired after 4 doses, and that the patient has still been requiring the oxycodone for abdominal pain control during tonight's shift. I subsequently placed an updated order to resume his prn oxycodone for 4 additional doses.     Shaun Pigg, DO Hospitalist

## 2023-10-13 LAB — CULTURE, BLOOD (ROUTINE X 2): Culture: NO GROWTH

## 2023-10-16 ENCOUNTER — Telehealth (HOSPITAL_COMMUNITY): Payer: Self-pay

## 2023-10-16 NOTE — Telephone Encounter (Signed)
 Called to confirm/remind patient of their appointment at the Advanced Heart Failure Clinic on 10/19/2023 12:00.   Appointment:   [] Confirmed  [x] Left mess   [] No answer/No voice mail  [] Phone not in service  Patient reminded to bring all medications and/or complete list.  Confirmed patient has transportation. Gave directions, instructed to utilize valet parking.

## 2023-10-19 ENCOUNTER — Ambulatory Visit (HOSPITAL_COMMUNITY)
Admission: RE | Admit: 2023-10-19 | Discharge: 2023-10-19 | Disposition: A | Discharge status: Home / Self Care | Source: Ambulatory Visit | Attending: Cardiology

## 2023-10-19 ENCOUNTER — Encounter (HOSPITAL_COMMUNITY): Payer: Self-pay

## 2023-10-19 VITALS — BP 114/84 | HR 99 | Wt 165.4 lb

## 2023-10-19 DIAGNOSIS — Z95810 Presence of automatic (implantable) cardiac defibrillator: Secondary | ICD-10-CM | POA: Diagnosis not present

## 2023-10-19 DIAGNOSIS — I513 Intracardiac thrombosis, not elsewhere classified: Secondary | ICD-10-CM | POA: Diagnosis not present

## 2023-10-19 DIAGNOSIS — Z91199 Patient's noncompliance with other medical treatment and regimen due to unspecified reason: Secondary | ICD-10-CM | POA: Insufficient documentation

## 2023-10-19 DIAGNOSIS — Z79899 Other long term (current) drug therapy: Secondary | ICD-10-CM | POA: Insufficient documentation

## 2023-10-19 DIAGNOSIS — I110 Hypertensive heart disease with heart failure: Secondary | ICD-10-CM | POA: Insufficient documentation

## 2023-10-19 DIAGNOSIS — I5082 Biventricular heart failure: Secondary | ICD-10-CM | POA: Insufficient documentation

## 2023-10-19 DIAGNOSIS — I255 Ischemic cardiomyopathy: Secondary | ICD-10-CM | POA: Insufficient documentation

## 2023-10-19 DIAGNOSIS — I428 Other cardiomyopathies: Secondary | ICD-10-CM | POA: Diagnosis not present

## 2023-10-19 DIAGNOSIS — F1910 Other psychoactive substance abuse, uncomplicated: Secondary | ICD-10-CM | POA: Diagnosis not present

## 2023-10-19 DIAGNOSIS — F1290 Cannabis use, unspecified, uncomplicated: Secondary | ICD-10-CM | POA: Insufficient documentation

## 2023-10-19 DIAGNOSIS — I2510 Atherosclerotic heart disease of native coronary artery without angina pectoris: Secondary | ICD-10-CM | POA: Diagnosis not present

## 2023-10-19 DIAGNOSIS — Z7984 Long term (current) use of oral hypoglycemic drugs: Secondary | ICD-10-CM | POA: Diagnosis not present

## 2023-10-19 DIAGNOSIS — Z7901 Long term (current) use of anticoagulants: Secondary | ICD-10-CM | POA: Diagnosis not present

## 2023-10-19 DIAGNOSIS — Z8619 Personal history of other infectious and parasitic diseases: Secondary | ICD-10-CM | POA: Insufficient documentation

## 2023-10-19 DIAGNOSIS — I340 Nonrheumatic mitral (valve) insufficiency: Secondary | ICD-10-CM | POA: Diagnosis not present

## 2023-10-19 DIAGNOSIS — K5090 Crohn's disease, unspecified, without complications: Secondary | ICD-10-CM | POA: Insufficient documentation

## 2023-10-19 DIAGNOSIS — I5022 Chronic systolic (congestive) heart failure: Secondary | ICD-10-CM | POA: Insufficient documentation

## 2023-10-19 DIAGNOSIS — F17210 Nicotine dependence, cigarettes, uncomplicated: Secondary | ICD-10-CM | POA: Diagnosis not present

## 2023-10-19 DIAGNOSIS — I252 Old myocardial infarction: Secondary | ICD-10-CM | POA: Diagnosis not present

## 2023-10-19 DIAGNOSIS — Z86718 Personal history of other venous thrombosis and embolism: Secondary | ICD-10-CM | POA: Insufficient documentation

## 2023-10-19 LAB — BASIC METABOLIC PANEL WITH GFR
Anion gap: 7 (ref 5–15)
BUN: 19 mg/dL (ref 6–20)
CO2: 26 mmol/L (ref 22–32)
Calcium: 8.7 mg/dL — ABNORMAL LOW (ref 8.9–10.3)
Chloride: 104 mmol/L (ref 98–111)
Creatinine, Ser: 1.21 mg/dL (ref 0.61–1.24)
GFR, Estimated: >60 mL/min (ref >60)
Glucose, Bld: 111 mg/dL — ABNORMAL HIGH (ref 70–99)
Potassium: 4.2 mmol/L (ref 3.5–5.1)
Sodium: 137 mmol/L (ref 135–145)

## 2023-10-19 LAB — DIGOXIN LEVEL: Digoxin Level: <0.2 ng/mL — ABNORMAL LOW (ref 0.8–2.0)

## 2023-10-19 LAB — LIPID PANEL
Cholesterol: 138 mg/dL (ref 0–200)
HDL: 40 mg/dL — ABNORMAL LOW (ref >40)
LDL Cholesterol: 78 mg/dL (ref 0–99)
Total CHOL/HDL Ratio: 3.5 ratio
Triglycerides: 98 mg/dL (ref <150)
VLDL: 20 mg/dL (ref 0–40)

## 2023-10-19 LAB — BRAIN NATRIURETIC PEPTIDE: B Natriuretic Peptide: 801.6 pg/mL — ABNORMAL HIGH (ref 0.0–100.0)

## 2023-10-19 MED ORDER — LOSARTAN POTASSIUM 25 MG PO TABS: 12.5000 mg | ORAL_TABLET | Freq: Every day | ORAL | 3 refills | Status: DC

## 2023-10-19 MED ORDER — SPIRONOLACTONE 25 MG PO TABS: 25.0000 mg | ORAL_TABLET | Freq: Every day | ORAL | 8 refills | Status: DC

## 2023-10-19 MED ORDER — TORSEMIDE 20 MG PO TABS
40.0000 mg | ORAL_TABLET | Freq: Every day | ORAL | 8 refills | Status: DC
Start: 2023-10-19 — End: 2023-12-21

## 2023-10-19 NOTE — Progress Notes (Signed)
 ReDS Vest / Clip - 10/19/23 1200       ReDS Vest / Clip   Station Marker C    Ruler Value 27    ReDS Value Range Moderate volume overload    ReDS Actual Value 40

## 2023-10-19 NOTE — Patient Instructions (Addendum)
 Thank you for coming in today  If you had labs drawn today, any labs that are abnormal the clinic will call you No news is good news  Medications:PLEASE PICK UP MEDICATIONS [facility] [place] Start Losartan 12.5 mg daily  START Torsemide 40 mg daily  Increase spironolactone to 25 mg daily Stop Bumex  Stop Jardiance  Follow up appointments: Your physician recommends that you return for lab work in: 10 days bmet and bnp  Your physician recommends that you schedule a follow-up appointment in:  3 weeks in clinic   Do the following things EVERYDAY: Weigh yourself in the morning before breakfast. Write it down and keep it in a log. Take your medicines as prescribed Eat low salt foods--Limit salt (sodium) to 2000 mg per day.  Stay as active as you can everyday Limit all fluids for the day to less than 2 liters   At the Advanced Heart Failure Clinic, you and your health needs are our priority. As part of our continuing mission to provide you with exceptional heart care, we have created designated Provider Care Teams. These Care Teams include your primary Cardiologist (physician) and Advanced Practice Providers (APPs- Physician Assistants and Nurse Practitioners) who all work together to provide you with the care you need, when you need it.   You may see any of the following providers on your designated Care Team at your next follow up: Dr Arvilla Meres Dr Marca Ancona Dr. Marcos Eke, NP Robbie Lis, [place] [facility] [place] [facility], [place] Brynda Peon, NP Karle Plumber, PharmD   Please be sure to bring in all your medications bottles to every appointment.    Thank you for choosing [facility]  If you have any questions or concerns before your next appointment please send [place] a message through [place] or call our office at [phone].    TO LEAVE A MESSAGE FOR THE NURSE SELECT OPTION 2, PLEASE LEAVE A MESSAGE  INCLUDING: YOUR NAME DATE OF BIRTH CALL BACK NUMBER REASON FOR CALL**this is important as we prioritize the call backs  YOU WILL RECEIVE A CALL BACK THE SAME DAY AS LONG AS YOU CALL BEFORE 4:00 PM

## 2023-10-19 NOTE — Progress Notes (Signed)
 ADVANCED HF CLINIC CONSULT NOTE  Referring Physician: Maye Hides, PA Primary Care: Maye Hides, [place] HF Cardiologist: Dr. Shirlee Latch  Reason for Visit: Heart Failure Follow-up HPI: Shaun [name] is a 38 y.o. male with history of CAD w/ prior MI in 2017, IV drug abuse, ischemic cardiomyopathy s/p ICD, chronic systolic CHF, HTN, Crohn's disease with perianal fistula, untreated chronic HepC, hx DVT/PE 10/24.    Anterior STEMI c/b cardiac arrest in 2017 in setting of cocaine, heroin and tobacco use. S/p PCI/DES to ostial/prox LAD and mid LAD as well as PTCA to D1. EF 25% at time of MI.    EF has remained reduced. Echo 9/24 with EF 15-20%. Later had VT and underwent ICD placement in 10/24.    Admitted 10/24 with sepsis 2/2 MSSA bacteremia. [facility] back to prison with PICC line and IV antibiotics. Had TEE in 12/24 w/ no valvular or lead associated vegetations. LV function remained severely reduced.   Recent decompensation ED 10/07/23 in the setting of noncompliance with all medications for a few months d/t SE. CT A/P + colitis. Echo with EF < 20%, cannot rule out LV apical thrombus, RV mildly reduced, severe MR.    He returns today for heart failure follow up. Overall feeling poor. NYHA IIIb. Reports significant dyspnea with exertion, says he feels fluid on his lungs and is taking Bumex 3x/week (not on med list). Has generalized rash on BLE and back, since starting medications. Denies chest pain, near-syncope, orthopnea, palpitations, dizziness, and abnormal bleeding. Reports nausea, however reports that it is likely related to his Crohns. Able to perform ADLs. Appetite poor d/y nausea. Sleeping on 2 pillows. Compliant with all medications. Reports that he is only smoking marijuana, no other drugs, no ETOH.   SH: Recently released from prison on 10/05/23. Now living in [place]. Reports he did not use any drugs in prison other than suboxone. UDS 10/09/23 + opiates, amphetamine,  THC.  Past Medical History:  Diagnosis Date   Beta-hemolytic group B streptococcal sepsis (HCC) 10/2019   at [facility]   Chronic systolic CHF (congestive heart failure) (HCC) 12/30/2019   Coronary artery disease involving native coronary artery of native heart without angina pectoris 12/30/2019   Essential hypertension 12/30/2019   Hypertension    Intravenous drug abuse (HCC) 12/30/2019   Ischemic cardiomyopathy 12/30/2019   Mixed hyperlipidemia 12/30/2019   Nicotine dependence, cigarettes, uncomplicated 12/30/2019   Current Outpatient Medications  Medication Sig Dispense Refill   apixaban (ELIQUIS) 5 MG TABS tablet Take 1 tablet (5 mg total) by mouth 2 (two) times daily. 60 tablet 2   ASPIRIN ADULT PO Take 81 mg by mouth daily.     carvedilol (COREG) 3.125 MG tablet Take 1 tablet (3.125 mg total) by mouth 2 (two) times daily with a meal. 60 tablet 0   digoxin (LANOXIN) 0.125 MG tablet Take 1 tablet (0.125 mg total) by mouth daily. 30 tablet 2   empagliflozin (JARDIANCE) 10 MG TABS tablet Take 1 tablet (10 mg total) by mouth daily. 30 tablet 2   polyethylene glycol powder (GLYCOLAX/MIRALAX) 17 GM/SCOOP powder Take 1 capful (17 g) by mouth daily as needed for mild constipation. 238 g 0   spironolactone (ALDACTONE) 25 MG tablet Take 0.5 tablets (12.5 mg total) by mouth daily. 15 tablet 2   torsemide (DEMADEX) 20 MG tablet Take 1 tablet (20 mg total) by mouth daily. 30 tablet 2   No current facility-administered medications for this encounter.   Allergies  Allergen Reactions   Amoxicillin Swelling   Codeine Itching   Penicillins     Itching Did it involve swelling of the face/tongue/throat, SOB, or low BP? SOB & Swelling Which Caused Blood Clots Did it involve sudden or severe rash/hives, skin peeling, or any reaction on the inside of your mouth or nose? No Did you need to seek medical attention at a hospital or doctor's office? Y When did it last happen?  Over month ago     If all  above answers are "NO", may proceed with cephalosporin use.     Ketorolac Itching and Other (See Comments)    nightmares   Social History   Socioeconomic History   Marital status: Single    Spouse name: Not on file   Number of children: Not on file   Years of education: Not on file   Highest education level: Not on file  Occupational History   Not on file  Tobacco Use   Smoking status: Every Day    Current packs/day: 1.00    Types: Cigarettes   Smokeless tobacco: Never  Substance and Sexual Activity   Alcohol use: No   Drug use: Yes    Types: IV    Comment: opiates   Sexual activity: Not on file  Other Topics Concern   Not on file  Social History Narrative   Not on file   Social Drivers of Health   Financial Resource Strain: High Risk (05/19/2023)   Received [facility]   Overall Financial Resource Strain (CARDIA)    Difficulty of Paying Living Expenses: Very hard  Food Insecurity: No Food Insecurity (10/08/2023)   Hunger Vital Sign    Worried About Running Out of Food in the Last Year: Never true    Ran Out of Food in the Last Year: Never true  Transportation Needs: No Transportation Needs (10/08/2023)   PRAPARE - Administrator, Civil Service (Medical): No    Lack of Transportation (Non-Medical): No  Physical Activity: Not on file  Stress: Not on file  Social Connections: Unknown (07/02/2022)   Received from [facility]   Social Network    Social Network: Not on file  Intimate Partner Violence: Not At Risk (10/08/2023)   Humiliation, Afraid, Rape, and Kick questionnaire    Fear of Current or Ex-Partner: No    Emotionally Abused: No    Physically Abused: No    Sexually Abused: No    Family History  Family history unknown: Yes   Vitals:   10/19/23 1203  BP: 114/84  Pulse: 99  SpO2: 97%  Weight: 75 kg (165 lb 6.4 oz)    Filed Weights   10/19/23 1203  Weight: 75 kg (165 lb 6.4 oz)   PHYSICAL EXAM: General: Disheveled  appearing. No distress on RA Cardiac: JVP to mid neck. S1 and S2 present. No murmurs or rub. Extremities: Warm and dry.  No peripheral edema.  Neuro: Alert and oriented x3. Affect pleasant. Restless. Pinpoint pupils  ReDs reading: 40 %, abnormal  Medtronic Device (personally reviewed): 0 AF/VT, 99.9% sensing, <0.1% pacing, activity 2.2h/day, no volume report due to MDT error  ASSESSMENT & PLAN:  1. Chronic Biventricular Systolic Heart Failure: Mixed ischemic/nonischemic CM. EF 25% in 2017 s/p STEMI. Has long history of IV drug use, however has been off drugs d/t incarceration. EF persistently low <20% in 9/24 with endocarditis, s/p ICD 10/24. Recent decompensation 3/25, EF remains <20% (had been off all GDMT). CMR with  LVEF 13%, no LV thrombus, dilated RV w mod HK, LGE related to MI.  - NYHA Class III. Volume up by exam and ReDs.  - Diuretic: Increase Torsemide  to 40 mg daily. BNP/BMET today, repeat in 10 days - GDMT  a. ? blocker: hold bb in setting of volume overload and EF 13%  b. MRA: increase spirolactone 25 mg daily  c. SLGT2i: stop Jardiance 10 mg daily d/t generalized rash  d. ACE/ARB/ARNI: start losartan 12.5 mg daily  e. Digoxin: continue 0.125 mcg daily. Check level today - Devices therapies: Has Medtronic ICD.  - Advanced therapies: Not an advanced therapies candidate ATM d/t drug use and noncompliance. USD + for amphetamine and opiates 3/25.   2. CAD: STEMI in 2017 in setting of cocaine, heroin and tobacco use. S/p PTCA/DES to ostial/prox LAD and mid LAD, PTCA to D1 - Continue statin. Check lipids today   3. MR: Severe on echo. Mod-severe on CMR. With severity of HF, would likely not benefit from MitralClip.   4. Hx VT: S/p ICD placement    5. Possible LV thrombus: Could not r/o on echo 3/25, no evidence of thrombus on cMRI.  - continue Eliquis 5 mg bid at least for 3 months with low EF    6. Polysubstance abuse: H/o IV drug use. Cocaine, Methamphetamine, Heroin,  Tobacco. - Denied recent drug use other than marijuana - UDS 10/09/23 + for opiates, amphetamines, and THC   Follow up with APP in 2 weeks. High risk for readmission.  [place] Carinna Newhart, NP 10/19/23

## 2023-11-25 ENCOUNTER — Other Ambulatory Visit (HOSPITAL_COMMUNITY)

## 2023-11-30 ENCOUNTER — Emergency Department (HOSPITAL_COMMUNITY)

## 2023-11-30 ENCOUNTER — Encounter (HOSPITAL_COMMUNITY): Payer: Self-pay | Admitting: *Deleted

## 2023-11-30 ENCOUNTER — Other Ambulatory Visit: Payer: Self-pay

## 2023-11-30 ENCOUNTER — Inpatient Hospital Stay (HOSPITAL_COMMUNITY)
Admission: EM | Admit: 2023-11-30 | Discharge: 2023-12-03 | DRG: 385 | Discharge status: Against Medical Advice | Attending: Internal Medicine | Admitting: Internal Medicine

## 2023-11-30 DIAGNOSIS — F322 Major depressive disorder, single episode, severe without psychotic features: Secondary | ICD-10-CM | POA: Diagnosis present

## 2023-11-30 DIAGNOSIS — I160 Hypertensive urgency: Secondary | ICD-10-CM | POA: Diagnosis present

## 2023-11-30 DIAGNOSIS — B182 Chronic viral hepatitis C: Secondary | ICD-10-CM | POA: Diagnosis present

## 2023-11-30 DIAGNOSIS — I252 Old myocardial infarction: Secondary | ICD-10-CM

## 2023-11-30 DIAGNOSIS — I255 Ischemic cardiomyopathy: Secondary | ICD-10-CM | POA: Diagnosis present

## 2023-11-30 DIAGNOSIS — Z86718 Personal history of other venous thrombosis and embolism: Secondary | ICD-10-CM

## 2023-11-30 DIAGNOSIS — I5082 Biventricular heart failure: Secondary | ICD-10-CM | POA: Diagnosis present

## 2023-11-30 DIAGNOSIS — R1032 Left lower quadrant pain: Secondary | ICD-10-CM

## 2023-11-30 DIAGNOSIS — K625 Hemorrhage of anus and rectum: Secondary | ICD-10-CM | POA: Diagnosis present

## 2023-11-30 DIAGNOSIS — Z7982 Long term (current) use of aspirin: Secondary | ICD-10-CM

## 2023-11-30 DIAGNOSIS — K922 Gastrointestinal hemorrhage, unspecified: Secondary | ICD-10-CM | POA: Diagnosis not present

## 2023-11-30 DIAGNOSIS — I5022 Chronic systolic (congestive) heart failure: Secondary | ICD-10-CM | POA: Diagnosis present

## 2023-11-30 DIAGNOSIS — Z885 Allergy status to narcotic agent status: Secondary | ICD-10-CM

## 2023-11-30 DIAGNOSIS — K5090 Crohn's disease, unspecified, without complications: Secondary | ICD-10-CM | POA: Diagnosis present

## 2023-11-30 DIAGNOSIS — A419 Sepsis, unspecified organism: Secondary | ICD-10-CM

## 2023-11-30 DIAGNOSIS — F1590 Other stimulant use, unspecified, uncomplicated: Secondary | ICD-10-CM | POA: Diagnosis present

## 2023-11-30 DIAGNOSIS — D62 Acute posthemorrhagic anemia: Secondary | ICD-10-CM | POA: Diagnosis present

## 2023-11-30 DIAGNOSIS — G8929 Other chronic pain: Secondary | ICD-10-CM | POA: Diagnosis present

## 2023-11-30 DIAGNOSIS — K50111 Crohn's disease of large intestine with rectal bleeding: Principal | ICD-10-CM | POA: Diagnosis present

## 2023-11-30 DIAGNOSIS — D638 Anemia in other chronic diseases classified elsewhere: Secondary | ICD-10-CM | POA: Diagnosis present

## 2023-11-30 DIAGNOSIS — K50114 Crohn's disease of large intestine with abscess: Secondary | ICD-10-CM

## 2023-11-30 DIAGNOSIS — Z5329 Procedure and treatment not carried out because of patient's decision for other reasons: Secondary | ICD-10-CM | POA: Diagnosis present

## 2023-11-30 DIAGNOSIS — I2510 Atherosclerotic heart disease of native coronary artery without angina pectoris: Secondary | ICD-10-CM | POA: Diagnosis present

## 2023-11-30 DIAGNOSIS — Z79899 Other long term (current) drug therapy: Secondary | ICD-10-CM

## 2023-11-30 DIAGNOSIS — Z7901 Long term (current) use of anticoagulants: Secondary | ICD-10-CM

## 2023-11-30 DIAGNOSIS — Z955 Presence of coronary angioplasty implant and graft: Secondary | ICD-10-CM

## 2023-11-30 DIAGNOSIS — Z880 Allergy status to penicillin: Secondary | ICD-10-CM

## 2023-11-30 DIAGNOSIS — K6289 Other specified diseases of anus and rectum: Principal | ICD-10-CM

## 2023-11-30 DIAGNOSIS — I5023 Acute on chronic systolic (congestive) heart failure: Secondary | ICD-10-CM | POA: Diagnosis present

## 2023-11-30 DIAGNOSIS — K611 Rectal abscess: Secondary | ICD-10-CM | POA: Insufficient documentation

## 2023-11-30 DIAGNOSIS — Z8679 Personal history of other diseases of the circulatory system: Secondary | ICD-10-CM

## 2023-11-30 DIAGNOSIS — K5010 Crohn's disease of large intestine without complications: Secondary | ICD-10-CM | POA: Insufficient documentation

## 2023-11-30 DIAGNOSIS — E782 Mixed hyperlipidemia: Secondary | ICD-10-CM | POA: Diagnosis present

## 2023-11-30 DIAGNOSIS — I509 Heart failure, unspecified: Secondary | ICD-10-CM

## 2023-11-30 DIAGNOSIS — F1410 Cocaine abuse, uncomplicated: Secondary | ICD-10-CM | POA: Diagnosis present

## 2023-11-30 DIAGNOSIS — I340 Nonrheumatic mitral (valve) insufficiency: Secondary | ICD-10-CM | POA: Diagnosis present

## 2023-11-30 DIAGNOSIS — I10 Essential (primary) hypertension: Secondary | ICD-10-CM | POA: Diagnosis present

## 2023-11-30 DIAGNOSIS — Z5986 Financial insecurity: Secondary | ICD-10-CM

## 2023-11-30 DIAGNOSIS — K50113 Crohn's disease of large intestine with fistula: Secondary | ICD-10-CM | POA: Diagnosis present

## 2023-11-30 DIAGNOSIS — F17210 Nicotine dependence, cigarettes, uncomplicated: Secondary | ICD-10-CM | POA: Diagnosis present

## 2023-11-30 DIAGNOSIS — Z95810 Presence of automatic (implantable) cardiac defibrillator: Secondary | ICD-10-CM

## 2023-11-30 DIAGNOSIS — Z86711 Personal history of pulmonary embolism: Secondary | ICD-10-CM

## 2023-11-30 DIAGNOSIS — B1920 Unspecified viral hepatitis C without hepatic coma: Secondary | ICD-10-CM | POA: Diagnosis present

## 2023-11-30 DIAGNOSIS — K604 Rectal fistula, unspecified: Secondary | ICD-10-CM | POA: Diagnosis present

## 2023-11-30 DIAGNOSIS — K50118 Crohn's disease of large intestine with other complication: Secondary | ICD-10-CM

## 2023-11-30 DIAGNOSIS — K603 Anal fistula, unspecified: Secondary | ICD-10-CM | POA: Diagnosis present

## 2023-11-30 DIAGNOSIS — I428 Other cardiomyopathies: Secondary | ICD-10-CM | POA: Diagnosis present

## 2023-11-30 DIAGNOSIS — Z91199 Patient's noncompliance with other medical treatment and regimen due to unspecified reason: Secondary | ICD-10-CM

## 2023-11-30 DIAGNOSIS — F1210 Cannabis abuse, uncomplicated: Secondary | ICD-10-CM | POA: Diagnosis present

## 2023-11-30 DIAGNOSIS — Z8674 Personal history of sudden cardiac arrest: Secondary | ICD-10-CM

## 2023-11-30 DIAGNOSIS — I110 Hypertensive heart disease with heart failure: Secondary | ICD-10-CM | POA: Diagnosis present

## 2023-11-30 DIAGNOSIS — F1910 Other psychoactive substance abuse, uncomplicated: Secondary | ICD-10-CM | POA: Diagnosis present

## 2023-11-30 LAB — CBC
HCT: 33.9 % — ABNORMAL LOW (ref 39.0–52.0)
Hemoglobin: 9.5 g/dL — ABNORMAL LOW (ref 13.0–17.0)
MCH: 18.3 pg — ABNORMAL LOW (ref 26.0–34.0)
MCHC: 28 g/dL — ABNORMAL LOW (ref 30.0–36.0)
MCV: 65.2 fL — ABNORMAL LOW (ref 80.0–100.0)
Platelets: 448 10*3/uL — ABNORMAL HIGH (ref 150–400)
RBC: 5.2 MIL/uL (ref 4.22–5.81)
RDW: 19.7 % — ABNORMAL HIGH (ref 11.5–15.5)
WBC: 10.8 10*3/uL — ABNORMAL HIGH (ref 4.0–10.5)
nRBC: 0 % (ref 0.0–0.2)

## 2023-11-30 LAB — IRON AND TIBC
Iron: 17 ug/dL — ABNORMAL LOW (ref 45–182)
Saturation Ratios: 4 % — ABNORMAL LOW (ref 17.9–39.5)
TIBC: 427 ug/dL (ref 250–450)
UIBC: 410 ug/dL

## 2023-11-30 LAB — TYPE AND SCREEN
ABO/RH(D): A POS
Antibody Screen: NEGATIVE

## 2023-11-30 LAB — FERRITIN: Ferritin: 6 ng/mL — ABNORMAL LOW (ref 24–336)

## 2023-11-30 LAB — COMPREHENSIVE METABOLIC PANEL WITH GFR
ALT: 9 U/L (ref 0–44)
AST: 19 U/L (ref 15–41)
Albumin: 2.8 g/dL — ABNORMAL LOW (ref 3.5–5.0)
Alkaline Phosphatase: 71 U/L (ref 38–126)
Anion gap: 8 (ref 5–15)
BUN: 12 mg/dL (ref 6–20)
CO2: 23 mmol/L (ref 22–32)
Calcium: 8.2 mg/dL — ABNORMAL LOW (ref 8.9–10.3)
Chloride: 105 mmol/L (ref 98–111)
Creatinine, Ser: 1.1 mg/dL (ref 0.61–1.24)
GFR, Estimated: >60 mL/min (ref >60)
Glucose, Bld: 104 mg/dL — ABNORMAL HIGH (ref 70–99)
Potassium: 4 mmol/L (ref 3.5–5.1)
Sodium: 136 mmol/L (ref 135–145)
Total Bilirubin: 0.7 mg/dL (ref 0.0–1.2)
Total Protein: 7 g/dL (ref 6.5–8.1)

## 2023-11-30 LAB — RAPID URINE DRUG SCREEN, HOSP PERFORMED
Amphetamines: NOT DETECTED
Barbiturates: NOT DETECTED
Benzodiazepines: NOT DETECTED
Cocaine: NOT DETECTED
Opiates: NOT DETECTED
Tetrahydrocannabinol: POSITIVE — AB

## 2023-11-30 LAB — HEPATITIS PANEL, ACUTE
HCV Ab: REACTIVE — AB
Hep A IgM: NONREACTIVE
Hep B C IgM: NONREACTIVE
Hepatitis B Surface Ag: NONREACTIVE

## 2023-11-30 LAB — BRAIN NATRIURETIC PEPTIDE: B Natriuretic Peptide: 1539.9 pg/mL — ABNORMAL HIGH (ref 0.0–100.0)

## 2023-11-30 LAB — POC OCCULT BLOOD, ED: Fecal Occult Bld: POSITIVE — AB

## 2023-11-30 LAB — TROPONIN I (HIGH SENSITIVITY): Troponin I (High Sensitivity): 10 ng/L (ref <18)

## 2023-11-30 LAB — C-REACTIVE PROTEIN: CRP: 0.7 mg/dL (ref <1.0)

## 2023-11-30 LAB — I-STAT CG4 LACTIC ACID, ED: Lactic Acid, Venous: 1.4 mmol/L (ref 0.5–1.9)

## 2023-11-30 LAB — SEDIMENTATION RATE: Sed Rate: 5 mm/h (ref 0–16)

## 2023-11-30 LAB — LACTIC ACID, PLASMA: Lactic Acid, Venous: 1.6 mmol/L (ref 0.5–1.9)

## 2023-11-30 MED ORDER — SODIUM CHLORIDE 0.9 % IV BOLUS
1000.0000 mL | Freq: Once | INTRAVENOUS | Status: AC
  Administered 2023-11-30: 1000 mL via INTRAVENOUS

## 2023-11-30 MED ORDER — METRONIDAZOLE 500 MG/100ML IV SOLN: 500.0000 mg | Freq: Two times a day (BID) | INTRAVENOUS | Status: DC

## 2023-11-30 MED ORDER — HYDROMORPHONE HCL 1 MG/ML IJ SOLN
1.0000 mg | Freq: Once | INTRAMUSCULAR | Status: AC
  Administered 2023-11-30: 1 mg via INTRAVENOUS
  Filled 2023-11-30: qty 1

## 2023-11-30 MED ORDER — HYDROMORPHONE HCL 1 MG/ML IJ SOLN
0.5000 mg | INTRAMUSCULAR | Status: DC | PRN
  Administered 2023-11-30 – 2023-12-02 (×11): 1 mg via INTRAVENOUS
  Filled 2023-11-30 (×12): qty 1

## 2023-11-30 MED ORDER — SODIUM CHLORIDE 0.9 % IV SOLN
1.0000 g | Freq: Once | INTRAVENOUS | Status: AC
  Administered 2023-11-30: 1 g via INTRAVENOUS
  Filled 2023-11-30: qty 10

## 2023-11-30 MED ORDER — IOHEXOL 350 MG/ML SOLN
75.0000 mL | Freq: Once | INTRAVENOUS | Status: AC | PRN
  Administered 2023-11-30: 75 mL via INTRAVENOUS

## 2023-11-30 MED ORDER — SODIUM CHLORIDE 0.9% FLUSH
3.0000 mL | Freq: Two times a day (BID) | INTRAVENOUS | Status: DC
  Administered 2023-11-30 – 2023-12-02 (×5): 3 mL via INTRAVENOUS

## 2023-11-30 MED ORDER — SODIUM CHLORIDE 0.9 % IV SOLN
2.0000 g | INTRAVENOUS | Status: DC
  Administered 2023-12-01 – 2023-12-02 (×2): 2 g via INTRAVENOUS
  Filled 2023-11-30 (×2): qty 20

## 2023-11-30 MED ORDER — METRONIDAZOLE 500 MG/100ML IV SOLN
500.0000 mg | Freq: Two times a day (BID) | INTRAVENOUS | Status: DC
  Administered 2023-11-30 – 2023-12-02 (×5): 500 mg via INTRAVENOUS
  Filled 2023-11-30 (×5): qty 100

## 2023-11-30 MED ORDER — ASPIRIN 81 MG PO TBEC
81.0000 mg | DELAYED_RELEASE_TABLET | Freq: Every day | ORAL | Status: DC
  Administered 2023-12-01 – 2023-12-03 (×3): 81 mg via ORAL
  Filled 2023-11-30 (×3): qty 1

## 2023-11-30 MED ORDER — FENTANYL CITRATE PF 50 MCG/ML IJ SOSY
50.0000 ug | PREFILLED_SYRINGE | Freq: Once | INTRAMUSCULAR | Status: AC
  Administered 2023-11-30: 50 ug via INTRAVENOUS
  Filled 2023-11-30: qty 1

## 2023-11-30 MED ORDER — ORAL CARE MOUTH RINSE: 15.0000 mL | OROMUCOSAL | Status: DC | PRN

## 2023-11-30 MED ORDER — ACETAMINOPHEN 650 MG RE SUPP: 650.0000 mg | Freq: Four times a day (QID) | RECTAL | Status: DC | PRN

## 2023-11-30 MED ORDER — LOSARTAN POTASSIUM 25 MG PO TABS: 12.5000 mg | ORAL_TABLET | Freq: Every day | ORAL | Status: DC

## 2023-11-30 MED ORDER — DIGOXIN 125 MCG PO TABS
0.1250 mg | ORAL_TABLET | Freq: Every day | ORAL | Status: DC
  Administered 2023-12-01 – 2023-12-03 (×3): 0.125 mg via ORAL
  Filled 2023-11-30 (×3): qty 1

## 2023-11-30 MED ORDER — ACETAMINOPHEN 325 MG PO TABS
650.0000 mg | ORAL_TABLET | Freq: Four times a day (QID) | ORAL | Status: DC | PRN
  Administered 2023-11-30: 650 mg via ORAL
  Filled 2023-11-30 (×2): qty 2

## 2023-11-30 MED ORDER — HYDRALAZINE HCL 20 MG/ML IJ SOLN
10.0000 mg | Freq: Once | INTRAMUSCULAR | Status: DC
  Filled 2023-11-30: qty 1

## 2023-11-30 NOTE — ED Notes (Signed)
 Reached out to lab for 2nd set of cultures

## 2023-11-30 NOTE — ED Notes (Signed)
 CCMD called to add pt to cardiac monitoring.

## 2023-11-30 NOTE — ED Provider Triage Note (Signed)
 Emergency Medicine Provider Triage Evaluation Note  Shaun [name] , a 38 y.o. male  was evaluated in triage.  Pt complains of rectal bleeding, syncope, fatigue, lightheadedness, rectal pain, abdominal pain.  The patient has a history of CHF with reduced EF, PE and DVT, MSSA bacteremia, Crohn's disease, history of IV drug abuse presenting to the emergency department with above symptoms.  The patient states that he has had several days of bright red blood per rectum.  He endorses severe rectal pain with left lower quadrant abdominal pain as well.  He is concern for rectal abscess and/or fistula.  No fevers or chills.  Last night he was lightheaded and subsequently syncopized.  Review of Systems  Positive: Syncope, abdominal pain, rectal pain, rectal bleeding, lightheadedness, fatigue Negative: Chest pain, shortness of breath  Physical Exam  Ht 6' (1.829 m)   Wt 77.1 kg   BMI 23.06 kg/m  Gen:   Awake, no distress , appears pale, not diaphoretic Resp:  Normal effort, lungs CTAB CV:  Well perfused, mildly tachycardic MSK:   Moves extremities without difficulty  Abd:  LLQ tenderness to palpation  Medical Decision Making  Medically screening exam initiated at 10:40 AM.  Appropriate orders placed.  Tregg Etters Steinhart was informed that the remainder of the evaluation will be completed by another provider, this initial triage assessment does not replace that evaluation, and the importance of remaining in the ED until their evaluation is complete.  Concern for Crohn's flare, GI bleed, CHF exacerbation, infectious etiology such as rectal abscess.  Will obtain CT imaging of the abdomen pelvis as well as CTA of the chest.  Greening labs to include type and screen obtained.  Patient will be placed on cardiac telemetry and informed patient will need a bed.   Rosealee Concha, MD 11/30/23 1059

## 2023-11-30 NOTE — H&P (Addendum)
 History and Physical   Chief Perreault Sehgal NWG:[id number] DOB: [birth year] DOA: 11/30/2023  PCP: Otis Blocker, PA   Patient coming from: Home  Chief Complaint: Rectal pain and bleeding  HPI: Shaun [name] is a 38 y.o. male with medical history significant of hypertension, hyperlipidemia, ventricular tachycardia status post ICD, CAD, Crohn's disease, hepatitis C, DVT/PE, substance use, depression, chronic systolic CHF presenting with rectal pain and now rectal bleeding.  Patient has had around 1 week of worsening rectal pain and left lower quadrant pain.  For the last 3 days he has had some bright red blood per rectum.  Also reports fatigue, lightheadedness and a syncopal event yesterday during episode of lightheadedness.  Known history of Crohn's disease.  Not currently on management due to having been incarcerated until his most recent admission.  He was seen in March of this year for acute on chronic CHF with severely reduced EF and possible LV thrombus with severe mitral regurgitation.  He was discharged on Eliquis .  He is off most of his medications and stopped his diuretic about a week ago when he started having the pain.  Intermittently takes Eliquis  and carvedilol .  Had some shortness of breath. Denies fevers, chills, chest pain.  ED Course: Vital signs in the ED notable for blood pressure in the 120s-130s systolic, heart rate in the 100s, respiratory rate in the 20s.  Lab workup included CMP with glucose 24, calcium  8.2, albumin 2.8.  CBC with leukocytosis to 10.8, hemoglobin currently stable at 9.5, platelets 440.  Troponin normal.  BNP elevated to 1539.  FOBT positive.  Patient typed and screened in the ED.  Blood cultures pending.  Imaging studies included a CTA PE study which was negative for PE or other acute abnormality.  CT abdomen pelvis showed changes consistent with small perirectal abscess as well as rectal wall thickening possibly consistent with his IBD and colonic  thickening which was similar to prior.  Patient received fentanyl , Dilaudid , ceftriaxone , Flagyl , 1 L IV fluids in the ED.  GI consulted and are seeing the patient.  Review of Systems: As per HPI otherwise all other systems reviewed and are negative.  Past Medical History:  Diagnosis Date   Acute deep vein thrombosis (DVT) of brachial vein of left upper extremity (HCC) 05/19/2023   Beta-hemolytic group B streptococcal sepsis (HCC) 10/2019   at [facility]   Cellulitis of left upper extremity 12/30/2019   Chronic systolic CHF (congestive heart failure) (HCC) 12/30/2019   Coronary artery disease involving native coronary artery of native heart without angina pectoris 12/30/2019   Essential hypertension 12/30/2019   Hypertension    Intravenous drug abuse (HCC) 12/30/2019   Ischemic cardiomyopathy 12/30/2019   Mixed hyperlipidemia 12/30/2019   Nicotine  dependence, cigarettes, uncomplicated 12/30/2019   Other pulmonary embolism without acute cor pulmonale (HCC) 05/19/2023   V tach (HCC) 04/22/2023    Past Surgical History:  Procedure Laterality Date   I & D EXTREMITY Left 01/03/2020   Procedure: IRRIGATION AND DEBRIDEMENT LEFT UPPER EXTREMITY;  Surgeon: Saundra Curl, MD;  Location: [facility];  Service: Orthopedics;  Laterality: Left;    Social History  reports that he has been smoking cigarettes. He has never used smokeless tobacco. He reports current drug use. Drugs: IV and Marijuana. He reports that he does not drink alcohol.  Allergies  Allergen Reactions   Penicillins Hives and Swelling   Codeine Itching   Ketorolac  Itching and Other (See Comments)    nightmares  Family History  Family history unknown: Yes  Reviewed on admission  Prior to Admission medications   Medication Sig Start Date End Date Taking? Authorizing Provider  apixaban  (ELIQUIS ) 5 MG TABS tablet Take 1 tablet (5 mg total) by mouth 2 (two) times daily. 10/12/23  Yes Pokhrel, Laxman, MD  aspirin  EC 81 MG  tablet Take 81 mg by mouth daily. Swallow whole.   Yes [provider]  carvedilol  (COREG ) 3.125 MG tablet Take 1 tablet (3.125 mg total) by mouth 2 (two) times daily with a meal. 01/08/20  Yes Kraig Peru, MD  digoxin  (LANOXIN ) 0.125 MG tablet Take 1 tablet (0.125 mg total) by mouth daily. 10/13/23 01/11/24 Yes Pokhrel, Laxman, MD  torsemide  (DEMADEX ) 20 MG tablet Take 2 tablets (40 mg total) by mouth daily. Patient taking differently: Take 20 mg by mouth daily. 10/19/23  Yes Lee, [place], NP  losartan  (COZAAR ) 25 MG tablet Take 0.5 tablets (12.5 mg total) by mouth daily. Patient not taking: Reported on 11/30/2023 10/19/23 01/17/24  Lee, [place], NP  polyethylene glycol powder (GLYCOLAX /MIRALAX ) 17 GM/SCOOP powder Take 1 capful (17 g) by mouth daily as needed for mild constipation. Patient not taking: Reported on 11/30/2023 10/12/23   Rosena Conradi, MD  spironolactone  (ALDACTONE ) 25 MG tablet Take 1 tablet (25 mg total) by mouth daily. Patient not taking: Reported on 11/30/2023 10/19/23   Lee, [place], NP    Physical Exam: Vitals:   11/30/23 1029 11/30/23 1150 11/30/23 1400  BP:  (!) 133/100 (!) 121/95  Pulse:  (!) 107 (!) 101  Resp:  18 (!) 32  Temp:  98.5 F (36.9 C)   TempSrc:  Oral   SpO2:  100% 99%  Weight: 77.1 kg    Height: 6' (1.829 m)      Physical Exam Constitutional:      General: He is not in acute distress.    Appearance: Normal appearance.  HENT:     Head: Normocephalic and atraumatic.     Mouth/Throat:     Mouth: Mucous membranes are moist.     Pharynx: Oropharynx is clear.  Eyes:     Extraocular Movements: Extraocular movements intact.     Pupils: Pupils are equal, round, and reactive to light.  Cardiovascular:     Rate and Rhythm: Regular rhythm. Tachycardia present.     Pulses: Normal pulses.     Heart sounds: Normal heart sounds.  Pulmonary:     Effort: Pulmonary effort is normal. No respiratory distress.     Breath sounds: Normal breath sounds.   Abdominal:     General: Bowel sounds are normal. There is no distension.     Palpations: Abdomen is soft.     Tenderness: There is abdominal tenderness.  Musculoskeletal:        General: No swelling or deformity.  Skin:    General: Skin is warm and dry.  Neurological:     General: No focal deficit present.     Mental Status: Mental status is at baseline.    Labs on Admission: I have personally reviewed following labs and imaging studies  CBC: Recent Labs  Lab 11/30/23 1132  WBC 10.8*  HGB 9.5*  HCT 33.9*  MCV 65.2*  PLT 448*    Basic Metabolic Panel: Recent Labs  Lab 11/30/23 1132  NA 136  K 4.0  CL 105  CO2 23  GLUCOSE 104*  BUN 12  CREATININE 1.10  CALCIUM  8.2*    GFR: Estimated Creatinine Clearance: 99.3 mL/min (by  C-G formula based on SCr of 1.1 mg/dL).  Liver Function Tests: Recent Labs  Lab 11/30/23 1132  AST 19  ALT 9  ALKPHOS 71  BILITOT 0.7  PROT 7.0  ALBUMIN 2.8*    Urine analysis:    Component Value Date/Time   COLORURINE YELLOW 12/30/2019 1931   APPEARANCEUR CLEAR 12/30/2019 1931   LABSPEC 1.019 12/30/2019 1931   PHURINE 7.0 12/30/2019 1931   GLUCOSEU NEGATIVE 12/30/2019 1931   HGBUR SMALL (A) 12/30/2019 1931   BILIRUBINUR NEGATIVE 12/30/2019 1931   KETONESUR NEGATIVE 12/30/2019 1931   PROTEINUR NEGATIVE 12/30/2019 1931   NITRITE NEGATIVE 12/30/2019 1931   LEUKOCYTESUR NEGATIVE 12/30/2019 1931    Radiological Exams on Admission: CT ABDOMEN PELVIS W CONTRAST Result Date: 11/30/2023 CLINICAL DATA:  Crohn's exacerbation, rectal pain EXAM: CT ABDOMEN AND PELVIS WITH CONTRAST TECHNIQUE: Multidetector CT imaging of the abdomen and pelvis was performed using the standard protocol following bolus administration of intravenous contrast. RADIATION DOSE REDUCTION: This exam was performed according to the departmental dose-optimization program which includes automated exposure control, adjustment of the mA and/or kV according to patient size  and/or use of iterative reconstruction technique. CONTRAST:  75mL OMNIPAQUE  IOHEXOL  350 MG/ML SOLN COMPARISON:  CT of the abdomen pelvis performed October 07, 2023 FINDINGS: Lower chest: Nothing significant. Hepatobiliary: No focal liver abnormality is seen. No gallstones, gallbladder wall thickening, or biliary dilatation. Pancreas: Unremarkable. No pancreatic ductal dilatation or surrounding inflammatory changes. Spleen: Normal in size without focal abnormality. Adrenals/Urinary Tract: The adrenal glands are within normal limits. Unchanged appearance of 2 cm left lower pole renal cyst. Stomach/Bowel: The stomach is nondilated. Scattered air-fluid levels are present within the small bowel. There are segments of small bowel which are mildly dilated in diameter and fluid-filled. There is no clear transition point. Wall thickening is suspected in the left colon with mild inflammatory stranding in the adjacent fat. The rectum is decompressed and wall thickening is favored. There is a small focus of soft tissue thickening in the medial right gluteal soft tissues with a possible small focus of air annotated on image 93 of series 5. This may represent a small perianal abscess or site of soft tissue infection. Vascular/Lymphatic: Mildly prominent, but not clearly pathologically enlarged lymph nodes are present. No significant vascular abnormality is appreciated. Reproductive: Nothing significant. Other: No abdominal wall hernia or abnormality. No abdominopelvic ascites. Musculoskeletal: No acute or significant osseous findings. IMPRESSION: 1. Small focus of soft tissue thickening and air in the medial right gluteal soft tissues, possibly representing a small perianal abscess or soft tissue infection. 2. Diffuse rectal wall thickening which can be observed in the setting of inflammatory bowel disease. 3. Mild diffuse thickening of the left colon, similar when compared to CT performed October 07, 2023. 4. No evidence of  peritoneal abscess. Electronically Signed   By: Reagan Camera M.D.   On: 11/30/2023 14:36   CT Angio Chest PE W and/or Wo Contrast Result Date: 11/30/2023 CLINICAL DATA:  Pulmonary embolism suspected EXAM: CT ANGIOGRAPHY CHEST WITH CONTRAST TECHNIQUE: Multidetector CT imaging of the chest was performed using the standard protocol during bolus administration of intravenous contrast. Multiplanar CT image reconstructions and MIPs were obtained to evaluate the vascular anatomy. Multiplanar image (3D post-processing) reconstructions and MIPs were obtained to evaluate the vascular anatomy. RADIATION DOSE REDUCTION: This exam was performed according to the departmental dose-optimization program which includes automated exposure control, adjustment of the mA and/or kV according to patient size and/or use of iterative reconstruction technique.  CONTRAST:  75mL OMNIPAQUE  IOHEXOL  350 MG/ML SOLN COMPARISON:  CT angiogram of the chest performed October 07, 2023 FINDINGS: Cardiovascular: Enlarged heart. Main pulmonary artery is within normal limits for size. No evidence of pulmonary embolism to the segmental level. Mediastinum/Nodes: No enlarged mediastinal, hilar, or axillary lymph nodes. Thyroid gland, trachea, and esophagus demonstrate no significant findings. Lungs/Pleura: Lungs are clear. No pleural effusion or pneumothorax. Upper Abdomen: Reported separately Musculoskeletal: No chest wall abnormality. No acute or significant osseous findings. Review of the MIP images confirms the above findings. IMPRESSION: 1. No evidence of pulmonary embolism to the segmental level. Electronically Signed   By: Reagan Camera M.D.   On: 11/30/2023 14:26    EKG: Independently reviewed.  Sinus tachycardia at 109 bpm.  Low voltage multiple leads.  Evidence of LVH.  Nonspecific T wave changes.  Some baseline wander.  PVC noted.  Assessment/Plan Active Problems:   History of DVT (deep vein thrombosis)   Coronary artery disease involving  native coronary artery of native heart without angina pectoris   Essential hypertension   Mixed hyperlipidemia   Chronic systolic CHF (congestive heart failure) (HCC)   AICD (automatic cardioverter/defibrillator) present   Chronic hepatitis C virus infection (HCC)   Crohn's disease (HCC)   Hx of ventricular tachycardia   Severe major depression without psychotic features (HCC)   Substance abuse (HCC)   GI bleed Crohn's flare Perirectal cellulitis > Known history of Crohn's disease but not on medical management beyond intermittent steroids due to being incarcerated until recently. > 1 week of worsening rectal and left lower quadrant pain and now 3 days of rectal bleeding. > Concern exam for perirectal cellulitis.  FOBT positive.  Hemoglobin remained stable at 9.5 on first check.  CT abdomen pelvis with rectal wall thickening which is new, colonic wall thickening which is similar, changes consistent with perirectal abscess possibly fistula. > GI consulted in the ED.  Patient received fentanyl , Dilaudid , ceftriaxone , Flagyl . - Monitor on progressive unit overnight given the send comorbidity of well to heart failure off medications - Appreciate GI recommendations and assistance - Continue with ceftriaxone , Flagyl  - Holding off on steroids for now while treating for his abscess - Trend fever curve and WBC - Trend hemoglobin - Follow-up blood cultures  Chronic combined systolic and diastolic CHF > Last echo in March with EF less than 20% with global hypokinesis, G3 DD, mildly reduced RV function, severe mitral regurgitation and possible RV thrombus.  MRI was unable to definitively rule out thrombus.  Patient discharged on Eliquis  after admission during which his echo was obtained. > S/P ICD > Has had 1 follow-up with advanced heart failure but has been not been taking his losartan , spironolactone .  Inconsistently taking carvedilol , digoxin , Eliquis . > Received 1 L IV fluids in the ED with BNP  elevated to 15 3 9  but not currently requiring oxygen.  - Will consult cardiology for assistance in medication regimen and plan for resumption given the severity of his CHF. - Monitoring on progressive unit - Strict I's and O's, daily weights - Check magnesium  - Hold off on diuretics for now. - Will continue to hold Eliquis  in the setting of above - Plan to resume digoxin  - Hold losartan , spironolactone  and Coreg   Hypertension - BP only mildly elevated in the ED. - Holding most antihypertensives as he has been off of them  History of V. Tach - Had previously been on antiarrhythmics but is no longer - Status post ICD  History of DVT  and PE - Holding Eliquis  as above  Chronic hepatitis C - Noted  History of cocaine and methamphetamine use - Noted  DVT prophylaxis: SCDs Code Status:   Full Family Communication:  None on admission Disposition Plan:   Patient is from:  Home  Anticipated DC to:  Home  Anticipated DC date:  1 to 4 days  Anticipated DC barriers: None  Consults called:  Gastroenterology, cardiology Admission status:     Observation, progressive  Severity of Illness: The appropriate patient status for this patient is OBSERVATION. Observation status is judged to be reasonable and necessary in order to provide the required intensity of service to ensure the patient's safety. The patient's presenting symptoms, physical exam findings, and initial radiographic and laboratory data in the context of their medical condition is felt to place them at decreased risk for further clinical deterioration. Furthermore, it is anticipated that the patient will be medically stable for discharge from the hospital within 2 midnights of admission.    Johnetta Nab MD [facility]  How to contact the [facility] Attending or Consulting provider 7A - 7P or covering provider during after hours 7P -7A, for this patient?   Check the care team in [facility] and look for a) attending/consulting  [facility] provider listed and b) the [facility] team listed Log into [email or web address] and use [place]'s universal password to access. If you do not have the password, please contact the hospital operator. Locate the [facility] provider you are looking for under [facility] and page to a number that you can be directly reached. If you still have difficulty reaching the provider, please page the [facility] (Director on Call) for the Hospitalists listed on amion for assistance.  11/30/2023, 3:35 PM

## 2023-11-30 NOTE — Consult Note (Signed)
 Reason for Consult:Crohn's, possible perianal abscess Referring Physician: [name] is an 38 y.o. male.  HPI: Shaun [name] with PMHx Crohn's disease, ischemic cardiomyopathy with heart failure and ICD in place, hypertension, previous IV drug abuse, DVT and PE in the past presented to the emergency department with a 4-day history of left lower quadrant abdominal pain abdominal pain, blood per rectum, and rectal pain.  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  I was asked to see him for possible perianal abscess seen on CT.  In addition to the above complaints, Shaun [name] also reports that Shaun [name] has had some vomiting as well as diarrhea.  Past Medical History:  Diagnosis Date   Acute deep vein thrombosis (DVT) of brachial vein of left upper extremity (HCC) 05/19/2023   Beta-hemolytic group B streptococcal sepsis (HCC) 10/2019   at [facility]   Cellulitis of left upper extremity 12/30/2019   Chronic systolic CHF (congestive heart failure) (HCC) 12/30/2019   Coronary artery disease involving native coronary artery of native heart without angina pectoris 12/30/2019   Essential hypertension 12/30/2019   Hypertension    Intravenous drug abuse (HCC) 12/30/2019   Ischemic cardiomyopathy 12/30/2019   Mixed hyperlipidemia 12/30/2019   Nicotine  dependence, cigarettes, uncomplicated 12/30/2019   Other pulmonary embolism without acute cor pulmonale (HCC) 05/19/2023   V tach (HCC) 04/22/2023    Past Surgical History:  Procedure Laterality Date   I & D EXTREMITY Left 01/03/2020   Procedure: IRRIGATION AND DEBRIDEMENT LEFT UPPER EXTREMITY;  Surgeon: Saundra Curl, MD;  Location: [facility];  Service: Orthopedics;  Laterality: Left;    Family History  Family history unknown: Yes    Social History:  reports that Shaun [name] has been smoking cigarettes. Shaun [name] has never used smokeless tobacco. Shaun [name] reports current drug use. Drugs: IV  and Marijuana. Shaun [name] reports that Shaun [name] does not drink alcohol.  Allergies:  Allergies  Allergen Reactions   Penicillins Hives and Swelling   Codeine Itching   Ketorolac  Itching and Other (See Comments)    nightmares    Medications: I have reviewed the patient's current medications.  Results for orders placed or performed during the hospital encounter of 11/30/23 (from the past 48 hours)  Comprehensive metabolic panel     Status: Abnormal   Collection Time: 11/30/23 11:32 AM  Result Value Ref Range   Sodium 136 135 - 145 mmol/L   Potassium 4.0 3.5 - 5.1 mmol/L   Chloride 105 98 - 111 mmol/L   CO2 23 22 - 32 mmol/L   Glucose, Bld 104 (H) 70 - 99 mg/dL    Comment: Glucose reference range applies only to samples taken after fasting for at least 8 hours.   BUN 12 6 - 20 mg/dL   Creatinine, Ser 1.61 0.61 - 1.24 mg/dL   Calcium  8.2 (L) 8.9 - 10.3 mg/dL   Total Protein 7.0 6.5 - 8.1 g/dL   Albumin 2.8 (L) 3.5 - 5.0 g/dL   AST 19 15 - 41 U/L   ALT 9 0 - 44 U/L   Alkaline Phosphatase 71 38 - 126 U/L   Total Bilirubin 0.7 0.0 - 1.2 mg/dL   GFR, Estimated >09 >60 mL/min    Comment: (NOTE) Calculated using the CKD-EPI Creatinine Equation (2021)    Anion gap 8 5 - 15    Comment: Performed at [facility], [address] [place], [place] [address]  CBC     Status: Abnormal   Collection Time: 11/30/23 11:32 AM  Result Value Ref Range   WBC 10.8 (H) 4.0 - 10.5 K/uL   RBC 5.20 4.22 - 5.81 MIL/uL   Hemoglobin 9.5 (L) 13.0 - 17.0 g/dL   HCT 56.2 (L) 13.0 - 86.5 %   MCV 65.2 (L) 80.0 - 100.0 fL   MCH 18.3 (L) 26.0 - 34.0 pg   MCHC 28.0 (L) 30.0 - 36.0 g/dL   RDW 78.4 (H) 69.6 - 29.5 %   Platelets 448 (H) 150 - 400 K/uL    Comment: REPEATED TO VERIFY   nRBC 0.0 0.0 - 0.2 %    Comment: Performed at [facility], [address] [place], [place] [address]  Troponin I (High Sensitivity)     Status: None   Collection Time: 11/30/23 11:32 AM  Result Value Ref Range   Troponin I  (High Sensitivity) 10 <18 ng/L    Comment: (NOTE) Elevated high sensitivity troponin I (hsTnI) values and significant  changes across serial measurements may suggest ACS but many other  chronic and acute conditions are known to elevate hsTnI results.  Refer to the "Links" section for chest pain algorithms and additional  guidance. Performed at [facility], [address], [place], [place] [address]   Brain natriuretic peptide     Status: Abnormal   Collection Time: 11/30/23 11:32 AM  Result Value Ref Range   B Natriuretic Peptide 1,539.9 (H) 0.0 - 100.0 pg/mL    Comment: Performed at [facility], [address] [place], [place] [address]  Type and screen [facility]     Status: None   Collection Time: 11/30/23 11:38 AM  Result Value Ref Range   ABO/RH(D) A POS    Antibody Screen NEG    Sample Expiration      12/03/2023,2359 Performed at [facility], [address] [place], [place] [address]   POC occult blood, ED     Status: Abnormal   Collection Time: 11/30/23  2:20 PM  Result Value Ref Range   Fecal Occult Bld POSITIVE (A) NEGATIVE    CT ABDOMEN PELVIS W CONTRAST Result Date: 11/30/2023 CLINICAL DATA:  Crohn's exacerbation, rectal pain EXAM: CT ABDOMEN AND PELVIS WITH CONTRAST TECHNIQUE: Multidetector CT imaging of the abdomen and pelvis was performed using the standard protocol following bolus administration of intravenous contrast. RADIATION DOSE REDUCTION: This exam was performed according to the departmental dose-optimization program which includes automated exposure control, adjustment of the mA and/or kV according to patient size and/or use of iterative reconstruction technique. CONTRAST:  75mL OMNIPAQUE  IOHEXOL  350 MG/ML SOLN COMPARISON:  CT of the abdomen pelvis performed October 07, 2023 FINDINGS: Lower chest: Nothing significant. Hepatobiliary: No focal liver abnormality is seen. No gallstones, gallbladder wall thickening, or biliary  dilatation. Pancreas: Unremarkable. No pancreatic ductal dilatation or surrounding inflammatory changes. Spleen: Normal in size without focal abnormality. Adrenals/Urinary Tract: The adrenal glands are within normal limits. Unchanged appearance of 2 cm left lower pole renal cyst. Stomach/Bowel: The stomach is nondilated. Scattered air-fluid levels are present within the small bowel. There are segments of small bowel which are mildly dilated in diameter and fluid-filled. There is no clear transition point. Wall thickening is suspected in the left colon with mild inflammatory stranding in the adjacent fat. The rectum is decompressed and wall thickening is favored. There is a small focus of soft tissue thickening in the medial right  gluteal soft tissues with a possible small focus of air annotated on image 93 of series 5. This may represent a small perianal abscess or site of soft tissue infection. Vascular/Lymphatic: Mildly prominent, but not clearly pathologically enlarged lymph nodes are present. No significant vascular abnormality is appreciated. Reproductive: Nothing significant. Other: No abdominal wall hernia or abnormality. No abdominopelvic ascites. Musculoskeletal: No acute or significant osseous findings. IMPRESSION: 1. Small focus of soft tissue thickening and air in the medial right gluteal soft tissues, possibly representing a small perianal abscess or soft tissue infection. 2. Diffuse rectal wall thickening which can be observed in the setting of inflammatory bowel disease. 3. Mild diffuse thickening of the left colon, similar when compared to CT performed October 07, 2023. 4. No evidence of peritoneal abscess. Electronically Signed   By: Reagan Camera [name].D.   On: 11/30/2023 14:36   CT Angio Chest PE W and/or Wo Contrast Result Date: 11/30/2023 CLINICAL DATA:  Pulmonary embolism suspected EXAM: CT ANGIOGRAPHY CHEST WITH CONTRAST TECHNIQUE: Multidetector CT imaging of the chest was performed using the  standard protocol during bolus administration of intravenous contrast. Multiplanar CT image reconstructions and MIPs were obtained to evaluate the vascular anatomy. Multiplanar image (3D post-processing) reconstructions and MIPs were obtained to evaluate the vascular anatomy. RADIATION DOSE REDUCTION: This exam was performed according to the departmental dose-optimization program which includes automated exposure control, adjustment of the mA and/or kV according to patient size and/or use of iterative reconstruction technique. CONTRAST:  75mL OMNIPAQUE  IOHEXOL  350 MG/ML SOLN COMPARISON:  CT angiogram of the chest performed October 07, 2023 FINDINGS: Cardiovascular: Enlarged heart. Main pulmonary artery is within normal limits for size. No evidence of pulmonary embolism to the segmental level. Mediastinum/Nodes: No enlarged mediastinal, hilar, or axillary lymph nodes. Thyroid gland, trachea, and esophagus demonstrate no significant findings. Lungs/Pleura: Lungs are clear. No pleural effusion or pneumothorax. Upper Abdomen: Reported separately Musculoskeletal: No chest wall abnormality. No acute or significant osseous findings. Review of the MIP images confirms the above findings. IMPRESSION: 1. No evidence of pulmonary embolism to the segmental level. Electronically Signed   By: Reagan Camera [name].D.   On: 11/30/2023 14:26    Review of Systems  Constitutional:  Positive for appetite change.  HENT: Negative.    Eyes: Negative.   Respiratory: Negative.    Cardiovascular:  Negative for chest pain.  Gastrointestinal:  Positive for abdominal pain, blood in stool, diarrhea, nausea, rectal pain and vomiting.  Endocrine: Negative.   Genitourinary: Negative.   Musculoskeletal: Negative.   Allergic/Immunologic: Negative.   Neurological: Negative.   Hematological: Negative.   Psychiatric/Behavioral: Negative.     Blood pressure (!) 129/99, pulse (!) 110, temperature 97.9 F (36.6 C), resp. rate 18, height 6' (1.829  [name]), weight 77.1 kg, SpO2 100%. Physical Exam HENT:     Head: Normocephalic.     Mouth/Throat:     Mouth: Mucous membranes are moist.  Cardiovascular:     Rate and Rhythm: Regular rhythm. Tachycardia present.     Pulses: Normal pulses.  Pulmonary:     Effort: Pulmonary effort is normal.     Breath sounds: Normal breath sounds. No wheezing.  Abdominal:     Palpations: Abdomen is soft.     Tenderness: There is abdominal tenderness. There is no guarding or rebound.     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  There is a lot of chronic scarring. Musculoskeletal:        General: No tenderness.  Skin:    General: Skin is warm.  Neurological:     Mental Status: Shaun [name] is alert and oriented to person, place, and time.  Psychiatric:     Comments: anxious     Assessment/Plan: Crohn's disease with nausea vomiting diarrhea, abdominal pain, chronic perirectal changes, and blood per rectum.  Appreciate GI evaluation.  Shaun [name] does not have an acute perirectal abscess at this time.  The dot of air on his CT is from one of the 2 fistula tracts that Shaun [name] has in place.  There is no acute infection in the region.  Agree with hospitalist admission and we will follow-up.  Shaun [name] 11/30/2023, 5:49 PM

## 2023-11-30 NOTE — Sepsis Progress Note (Signed)
 Sepsis protocol is being followed by [facility].

## 2023-11-30 NOTE — Consult Note (Signed)
 Consultation  Referring Provider: Dr. Adrain Alar     Primary Care Physician:  Otis Blocker, PA Primary Gastroenterologist:   [facility]     Reason for Consultation: Crohn's flare with rectal abscess and fistula         HPI:   Shaun [name] is a 38 y.o. male with a past medical history as listed below including CAD, hypertension, ischemic cardiomyopathy with an ICD in place, DVT, nicotine  dependence, IV drug abuse, NSTEMI and Crohn's disease who presented to the ED with rectal bleeding, syncope, fatigue, lightheadedness, rectal pain and abdominal pain.    It was noted the patient has a history of CHF with reduced EF, PE and DVT, MSSA bacteremia, Crohn's disease, history of IV drug abuse.  At time of presentation reported several days of bright red blood per rectum and severe rectal pain with left lower quadrant abdominal pain as well.  He was concern for abscess or fistula.  Also describes some lightheadedness and syncope the night before.      10/05/2023 patient released from prison.    10/07/2023-10/12/2023 patient admitted with chest pain and AKI as well as NSTEMI.  At that time CT showed left-sided colitis but patient did not complain of abdominal pain.  He was given IV Flagyl  and Rocephin  and FOBT positive.  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  Noted to have acute on chronic heart failure with reduced ejection fraction at the time EF 25%, status post AICD.  Echo at the time showed an LVEF of 13%.    Today, patient reports colonoscopy 2 years ago done in prison with diagnosis of Crohn's and severe damage throughout his colon, we do not have report, apparently was maintained on Prednisone tapers over the past couple of years off-and-on while in prison, but was never on maintenance therapy.  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  Describes what I think are setons placed in the abscess at some point and antibiotics, but it never resolved.  Tells me he has not been able to sleep in 4 days due to the discomfort.  Also reports pouring blood with loose stool constantly many times a day.    Denies fever or chills.  ED course: Rectal exam with fistulas, rectal erythema and TTP, fecal occult positive, CMP with an albumin low at 2.8, CBC with a white count of 10.8, hemoglobin 9.5, Hemoccult positive; CTAP with small focus of soft tissue thickening and air in the medial right gluteal soft tissues, possibly representing a small perianal abscess or soft tissue infection, diffuse rectal wall thickening which can be observed in the setting of inflammatory bowel disease, mild diffuse thickening of the left colon similar when compared to CT performed October 07, 2023 and no evidence of peritoneal abscess  Past Medical History:  Diagnosis Date   Beta-hemolytic group B streptococcal sepsis (HCC) 10/2019   at [facility]   Chronic systolic CHF (congestive  heart failure) (HCC) 12/30/2019   Coronary artery disease involving native coronary artery of native heart without angina pectoris 12/30/2019   Essential hypertension 12/30/2019   Hypertension    Intravenous drug abuse (HCC) 12/30/2019   Ischemic cardiomyopathy 12/30/2019   Mixed hyperlipidemia 12/30/2019   Nicotine  dependence, cigarettes, uncomplicated 12/30/2019    Past Surgical History:  Procedure Laterality Date   I & D EXTREMITY Left 01/03/2020   Procedure: IRRIGATION AND DEBRIDEMENT LEFT UPPER EXTREMITY;  Surgeon: Saundra Curl, MD;  Location: [facility];  Service: Orthopedics;  Laterality: Left;    Family History  Family history unknown: Yes    Social History   Tobacco Use   Smoking status: Every Day    Current packs/day: 1.00    Types: Cigarettes   Smokeless tobacco: Never  Substance Use Topics   Alcohol use: No   Drug use: Yes    Types: IV, Marijuana    Comment: opiates    Prior to Admission medications   Medication Sig Start Date End Date Taking? Authorizing Provider  apixaban  (ELIQUIS ) 5 MG TABS tablet Take 1 tablet (5 mg total) by mouth 2 (two) times daily. 10/12/23  Yes Pokhrel, Laxman, MD  aspirin  EC 81 MG tablet Take 81 mg by mouth daily. Swallow whole.   Yes [provider]  carvedilol  (COREG ) 3.125 MG tablet Take 1 tablet (3.125 mg total) by mouth 2 (two) times daily with a meal. 01/08/20  Yes Kraig Peru, MD  digoxin  (LANOXIN ) 0.125 MG tablet Take 1 tablet (0.125 mg total) by mouth daily. 10/13/23 01/11/24 Yes Pokhrel, Laxman, MD  torsemide  (DEMADEX ) 20 MG tablet Take 2 tablets (40 mg total) by mouth daily. Patient taking differently: Take 20 mg by mouth daily. 10/19/23  Yes Lee, [place], NP  losartan  (COZAAR ) 25 MG tablet Take 0.5 tablets (12.5 mg total) by mouth daily. Patient not taking: Reported on 11/30/2023 10/19/23 01/17/24  Lee, [place], NP  polyethylene glycol powder (GLYCOLAX /MIRALAX ) 17 GM/SCOOP powder Take 1 capful (17 g) by mouth daily as needed for mild constipation. Patient not taking: Reported on 11/30/2023 10/12/23   Rosena Conradi, MD  spironolactone  (ALDACTONE ) 25 MG tablet Take 1 tablet (25 mg total) by mouth daily. Patient not taking: Reported on 11/30/2023 10/19/23   Lee, [place], NP    Current Facility-Administered Medications  Medication Dose Route Frequency Provider Last Rate Last Admin   cefTRIAXone  (ROCEPHIN ) 1 g in sodium chloride  0.9 % 100 mL IVPB  1 g Intravenous Once Lawsing, James, MD       metroNIDAZOLE  (FLAGYL ) IVPB 500 mg  500 mg Intravenous Q12H Rosealee Concha, MD        Current Outpatient Medications  Medication Sig Dispense Refill   apixaban  (ELIQUIS ) 5 MG TABS tablet Take 1 tablet (5 mg total) by mouth 2 (two) times daily. 60 tablet 2   aspirin  EC 81 MG tablet Take 81 mg by mouth daily. Swallow whole.     carvedilol  (COREG ) 3.125 MG tablet Take 1 tablet (3.125 mg total) by mouth 2 (two) times daily with a meal. 60 tablet 0   digoxin  (LANOXIN ) 0.125 MG tablet Take 1 tablet (0.125 mg total) by mouth daily. 30 tablet 2   torsemide  (DEMADEX ) 20 MG tablet Take 2 tablets (40 mg total) by mouth daily. (Patient taking differently: Take 20 mg by mouth daily.) 60 tablet 8   losartan  (COZAAR ) 25 MG tablet Take 0.5 tablets (12.5 mg total) by mouth daily. (Patient not taking: Reported on 11/30/2023) 45 tablet  3   polyethylene glycol powder (GLYCOLAX /MIRALAX ) 17 GM/SCOOP powder Take 1 capful (17 g) by mouth daily as needed for mild constipation. (Patient not taking: Reported on 11/30/2023) 238 g 0   spironolactone  (ALDACTONE ) 25 MG tablet Take 1 tablet (25 mg total) by mouth daily. (Patient not taking: Reported on 11/30/2023) 30 tablet 8    Allergies as of 11/30/2023 - Review Complete 11/30/2023  Allergen Reaction Noted   Penicillins Hives and Swelling 03/13/2016   Codeine Itching 12/30/2019   Ketorolac  Itching and Other (See Comments) 05/07/2023     Review of Systems:    Constitutional: No weight loss, fever or chills Skin: No rash  Cardiovascular: No chest pain Respiratory: No SOB  Gastrointestinal: See HPI and otherwise negative Genitourinary: No dysuria  Neurological: No headache, dizziness or syncope Musculoskeletal: No new muscle or joint pain Hematologic: No bruising Psychiatric: No history of depression or anxiety    Physical Exam:  Vital signs in last 24 hours: Temp:  [98.5 F (36.9 C)] 98.5 F (36.9 C) (05/12 1150) Pulse Rate:  [101-107] 101 (05/12 1400) Resp:  [18-32] 32 (05/12 1400) BP: (121-133)/(95-100) 121/95 (05/12 1400) SpO2:  [99 %-100  %] 99 % (05/12 1400) Weight:  [77.1 kg] 77.1 kg (05/12 1029)   General:   Pleasant Caucasian male appears to be in mild distress, Well developed, Well nourished, alert and cooperative Head:  Normocephalic and atraumatic. Eyes:   PEERL, EOMI. No icterus. Conjunctiva pink. Ears:  Normal auditory acuity. Neck:  Supple Throat: Oral cavity and pharynx without inflammation, swelling or lesion. Teeth in good condition. Lungs: Respirations even and unlabored. Lungs clear to auscultation bilaterally.   No wheezes, crackles, or rhonchi.  Heart: Normal S1, S2. No MRG. Regular rate and rhythm. No peripheral edema, cyanosis or pallor.  Abdomen:  Soft, nondistended, marked generalized TTP, worse on the left side. Normal bowel sounds. No appreciable masses or hepatomegaly. Rectal: External with visible fistula openings on the left side of buttocks with a hard area with possible fluctuance very tender to palpation about the size of a golf ball, erythema Msk:  Symmetrical without gross deformities. Peripheral pulses intact.  Extremities:  Without edema, no deformity or joint abnormality. Normal ROM, normal sensation. Neurologic:  Alert and  oriented x4;  grossly normal neurologically. CN II-XII intact.  Skin:   Dry and intact without significant lesions or rashes. Psychiatric: Demonstrates good judgement and reason without abnormal affect or behaviors.   LAB RESULTS: Recent Labs    11/30/23 1132  WBC 10.8*  HGB 9.5*  HCT 33.9*  PLT 448*   BMET Recent Labs    11/30/23 1132  NA 136  K 4.0  CL 105  CO2 23  GLUCOSE 104*  BUN 12  CREATININE 1.10  CALCIUM  8.2*   LFT Recent Labs    11/30/23 1132  PROT 7.0  ALBUMIN 2.8*  AST 19  ALT 9  ALKPHOS 71  BILITOT 0.7   STUDIES: CT ABDOMEN PELVIS W CONTRAST Result Date: 11/30/2023 CLINICAL DATA:  Crohn's exacerbation, rectal pain EXAM: CT ABDOMEN AND PELVIS WITH CONTRAST TECHNIQUE: Multidetector CT imaging of the abdomen and pelvis was performed  using the standard protocol following bolus administration of intravenous contrast. RADIATION DOSE REDUCTION: This exam was performed according to the departmental dose-optimization program which includes automated exposure control, adjustment of the mA and/or kV according to patient size and/or use of iterative reconstruction technique. CONTRAST:  75mL OMNIPAQUE  IOHEXOL  350 MG/ML SOLN COMPARISON:  CT of the abdomen pelvis performed  October 07, 2023 FINDINGS: Lower chest: Nothing significant. Hepatobiliary: No focal liver abnormality is seen. No gallstones, gallbladder wall thickening, or biliary dilatation. Pancreas: Unremarkable. No pancreatic ductal dilatation or surrounding inflammatory changes. Spleen: Normal in size without focal abnormality. Adrenals/Urinary Tract: The adrenal glands are within normal limits. Unchanged appearance of 2 cm left lower pole renal cyst. Stomach/Bowel: The stomach is nondilated. Scattered air-fluid levels are present within the small bowel. There are segments of small bowel which are mildly dilated in diameter and fluid-filled. There is no clear transition point. Wall thickening is suspected in the left colon with mild inflammatory stranding in the adjacent fat. The rectum is decompressed and wall thickening is favored. There is a small focus of soft tissue thickening in the medial right gluteal soft tissues with a possible small focus of air annotated on image 93 of series 5. This may represent a small perianal abscess or site of soft tissue infection. Vascular/Lymphatic: Mildly prominent, but not clearly pathologically enlarged lymph nodes are present. No significant vascular abnormality is appreciated. Reproductive: Nothing significant. Other: No abdominal wall hernia or abnormality. No abdominopelvic ascites. Musculoskeletal: No acute or significant osseous findings. IMPRESSION: 1. Small focus of soft tissue thickening and air in the medial right gluteal soft tissues, possibly  representing a small perianal abscess or soft tissue infection. 2. Diffuse rectal wall thickening which can be observed in the setting of inflammatory bowel disease. 3. Mild diffuse thickening of the left colon, similar when compared to CT performed October 07, 2023. 4. No evidence of peritoneal abscess. Electronically Signed   By: Reagan Camera M.D.   On: 11/30/2023 14:36   CT Angio Chest PE W and/or Wo Contrast Result Date: 11/30/2023 CLINICAL DATA:  Pulmonary embolism suspected EXAM: CT ANGIOGRAPHY CHEST WITH CONTRAST TECHNIQUE: Multidetector CT imaging of the chest was performed using the standard protocol during bolus administration of intravenous contrast. Multiplanar CT image reconstructions and MIPs were obtained to evaluate the vascular anatomy. Multiplanar image (3D post-processing) reconstructions and MIPs were obtained to evaluate the vascular anatomy. RADIATION DOSE REDUCTION: This exam was performed according to the departmental dose-optimization program which includes automated exposure control, adjustment of the mA and/or kV according to patient size and/or use of iterative reconstruction technique. CONTRAST:  75mL OMNIPAQUE  IOHEXOL  350 MG/ML SOLN COMPARISON:  CT angiogram of the chest performed October 07, 2023 FINDINGS: Cardiovascular: Enlarged heart. Main pulmonary artery is within normal limits for size. No evidence of pulmonary embolism to the segmental level. Mediastinum/Nodes: No enlarged mediastinal, hilar, or axillary lymph nodes. Thyroid gland, trachea, and esophagus demonstrate no significant findings. Lungs/Pleura: Lungs are clear. No pleural effusion or pneumothorax. Upper Abdomen: Reported separately Musculoskeletal: No chest wall abnormality. No acute or significant osseous findings. Review of the MIP images confirms the above findings. IMPRESSION: 1. No evidence of pulmonary embolism to the segmental level. Electronically Signed   By: Reagan Camera M.D.   On: 11/30/2023 14:26     Impression / Plan:  Impression: 1.  Crohn's disease with flare: Patient reports colonoscopy 2 years ago done in prison with diagnosis of Crohn's and severe damage throughout his colon, we do not have report, apparently was maintained on Prednisone tapers over the past couple of years off-and-on, never on maintenance therapy, symptoms worsening since early March with liquid stool and hematochezia as well as rectal pain, fistulas and abscess 2.  Suspected perirectal cellulitis with abscess 3.  Chronic biventricular systolic heart failure: Mixed ischemic/nonischemic cardiomyopathy with an EF of less than 20%,  status post ICD 04/2023 4.  CAD: STEMI in 2017 in the setting of cocaine, heroin and tobacco, currently not on anticoagulation 5.  Severe mitral regurg  Plan: 1.  Will hold off on steroids at this very moment until patient is seen by surgical team. 2.  Ordered labs today including iron  studies, Quantiferon gold and viral hepatitis panel, ESR, CRP and fecal calprotectin 3.  Also GI pathogen panel 4.  Agree with antibiotics as ordered including Ceftriaxone  and Flagyl  5.  Will consult surgical team 6.  Patient to be placed on clear liquid diet for now 7.  Continue analgesics as needed  Thank you for your kind consultation, we will continue to follow.  Kathy Parker Emeterio Balke  11/30/2023, 3:17 PM

## 2023-11-30 NOTE — ED Notes (Signed)
 Page NP Zhao-Paged By Reeves Canter

## 2023-11-30 NOTE — Consult Note (Cosign Needed)
 Advanced Heart Failure Team Consult Note  Primary Physician: Otis Blocker, PA Cardiologist:  None Reason for Consultation: A/C Heart Failure HPI:    Shaun [name] is seen today for evaluation of a/c heart failure at the request of Dr. Rufina Cough.   Onni Bennis is a 38 y.o. male with history of CAD w/ prior MI in 2017, IV drug abuse, ischemic cardiomyopathy s/p ICD, chronic systolic CHF, HTN, Crohn's disease with perianal fistula, untreated chronic HepC, hx DVT/PE 10/24.    Anterior STEMI c/b cardiac arrest in 2017 in setting of cocaine, heroin and tobacco use. S/p PCI/DES to ostial/prox LAD and mid LAD as well as PTCA to D1. EF 25% at time of MI.    EF has remained reduced. Echo 9/24 with EF 15-20%. Later had VT and underwent ICD placement in 10/24.    Admitted 10/24 with sepsis 2/2 MSSA bacteremia. [facility] with PICC line and IV antibiotics. Had TEE in 12/24 w/ no valvular or lead associated vegetations. LV function remained severely reduced.   Recent decompensation ED 10/07/23 in the setting of noncompliance with all medications for a few months d/t SE. CT A/P + colitis. Echo with EF < 20%, cannot rule out LV apical thrombus, RV mildly reduced, severe MR.   Seen in clinic for follow up doing poor. GDMT was titrated, scheduled for close follow up, however no showed.  Presented to [facility] by EMS with rectal bleeding and syncope. He had been off his Bumex for 1 week. Vitals notable for SBP in 120-130s with narrow pulse pressure and tachycardic in the 100s. Labs notable for K 4.0, BUN/Cr 12/1.10, BNP 1540, hs-trop 10, hgb 9.5, WBC 10.8. Blood cultures obtained. EKG with ST 108 bpm and PVCs. CTPE and A/P showed perianal abscess and concern for inflammatory bowel disease. General Surgery consulted.   Reports adherence to coreg  and aspirin . Takes Bumex PRN for volume, last took 3 days ago. Reports that he has been clean since his last admission. Has been having shortness  of breath for the last couple of days, however biggest complaint is his abdominal and rectal pain.    Home Medications Prior to Admission medications   Medication Sig Start Date End Date Taking? Authorizing Provider  apixaban  (ELIQUIS ) 5 MG TABS tablet Take 1 tablet (5 mg total) by mouth 2 (two) times daily. 10/12/23  Yes Pokhrel, Laxman, MD  aspirin  EC 81 MG tablet Take 81 mg by mouth daily. Swallow whole.   Yes [provider]  carvedilol  (COREG ) 3.125 MG tablet Take 1 tablet (3.125 mg total) by mouth 2 (two) times daily with a meal. 01/08/20  Yes Kraig Peru, MD  digoxin  (LANOXIN ) 0.125 MG tablet Take 1 tablet (0.125 mg total) by mouth daily. 10/13/23 01/11/24 Yes Pokhrel, Laxman, MD  torsemide  (DEMADEX ) 20 MG tablet Take 2 tablets (40 mg total) by mouth daily. Patient taking differently: Take 20 mg by mouth daily. 10/19/23  Yes Lee, [place], NP  losartan  (COZAAR ) 25 MG tablet Take 0.5 tablets (12.5 mg total) by mouth daily. Patient not taking: Reported on 11/30/2023 10/19/23 01/17/24  Lee, [place], NP  polyethylene glycol powder (GLYCOLAX /MIRALAX ) 17 GM/SCOOP powder Take 1 capful (17 g) by mouth daily as needed for mild constipation. Patient not taking: Reported on 11/30/2023 10/12/23   Pokhrel, Laxman, MD  spironolactone  (ALDACTONE ) 25 MG tablet Take 1 tablet (25 mg total) by mouth daily. Patient not taking: Reported on 11/30/2023 10/19/23   Lee, [place], NP  Past Medical History: Past Medical History:  Diagnosis Date   Acute deep vein thrombosis (DVT) of brachial vein of left upper extremity (HCC) 05/19/2023   Beta-hemolytic group B streptococcal sepsis (HCC) 10/2019   at [facility]   Cellulitis of left upper extremity 12/30/2019   Chronic systolic CHF (congestive heart failure) (HCC) 12/30/2019   Coronary artery disease involving native coronary artery of native heart without angina pectoris 12/30/2019   Essential hypertension 12/30/2019   Hypertension    Intravenous drug abuse  (HCC) 12/30/2019   Ischemic cardiomyopathy 12/30/2019   Mixed hyperlipidemia 12/30/2019   Nicotine  dependence, cigarettes, uncomplicated 12/30/2019   Other pulmonary embolism without acute cor pulmonale (HCC) 05/19/2023   V tach (HCC) 04/22/2023   Past Surgical History: Past Surgical History:  Procedure Laterality Date   I & D EXTREMITY Left 01/03/2020   Procedure: IRRIGATION AND DEBRIDEMENT LEFT UPPER EXTREMITY;  Surgeon: Saundra Curl, MD;  Location: [facility];  Service: Orthopedics;  Laterality: Left;   Family History: Family History  Family history unknown: Yes   Social History: Social History   Socioeconomic History   Marital status: Single    Spouse name: Not on file   Number of children: Not on file   Years of education: Not on file   Highest education level: Not on file  Occupational History   Not on file  Tobacco Use   Smoking status: Every Day    Current packs/day: 1.00    Types: Cigarettes   Smokeless tobacco: Never  Substance and Sexual Activity   Alcohol use: No   Drug use: Yes    Types: IV, Marijuana    Comment: opiates   Sexual activity: Not on file  Other Topics Concern   Not on file  Social History Narrative   Not on file   Social Drivers of Health   Financial Resource Strain: High Risk (05/19/2023)   [facility]   Overall Financial Resource Strain (CARDIA)    Difficulty of Paying Living Expenses: Very hard  Food Insecurity: No Food Insecurity (10/08/2023)   Hunger Vital Sign    Worried About Running Out of Food in the Last Year: Never true    Ran Out of Food in the Last Year: Never true  Transportation Needs: No Transportation Needs (10/08/2023)   PRAPARE - Administrator, Civil Service (Medical): No    Lack of Transportation (Non-Medical): No  Physical Activity: Not on file  Stress: Not on file  Social Connections: Unknown (07/02/2022)   [facility]   Social Network    Social Network: Not on  file   Allergies:  Allergies  Allergen Reactions   Penicillins Hives and Swelling   Codeine Itching   Ketorolac  Itching and Other (See Comments)    nightmares   Objective:    Vital Signs:   Temp:  [97.9 F (36.6 C)-98.5 F (36.9 C)] 97.9 F (36.6 C) (05/12 1547) Pulse Rate:  [94-110] 110 (05/12 1545) Resp:  [18-32] 18 (05/12 1545) BP: (121-133)/(95-100) 129/99 (05/12 1545) SpO2:  [99 %-100 %] 100 % (05/12 1545) Weight:  [77.1 kg] 77.1 kg (05/12 1029)   Weight change: Filed Weights   11/30/23 1029  Weight: 77.1 kg   Intake/Output:  No intake or output data in the 24 hours ending 11/30/23 1611   Physical Exam    General: Disheveled appearing.  Cardiac: JVP ~11cm. S1 and S2 present. No murmurs or rub. Resp: Lung sounds clear and equal  B/L Extremities: Warm and dry.  No peripheral edema.  Neuro: Alert and oriented x3. Affect pleasant. Moves all extremities without difficulty.  Telemetry   SR in low 100s (personally reviewed)  EKG    As above  Labs   Basic Metabolic Panel: Recent Labs  Lab 11/30/23 1132  NA 136  K 4.0  CL 105  CO2 23  GLUCOSE 104*  BUN 12  CREATININE 1.10  CALCIUM  8.2*   Liver Function Tests: Recent Labs  Lab 11/30/23 1132  AST 19  ALT 9  ALKPHOS 71  BILITOT 0.7  PROT 7.0  ALBUMIN 2.8*   CBC: Recent Labs  Lab 11/30/23 1132  WBC 10.8*  HGB 9.5*  HCT 33.9*  MCV 65.2*  PLT 448*   BNP: BNP (last 3 results) Recent Labs    10/07/23 2312 10/19/23 1242 11/30/23 1132  BNP 1,830.7* 801.6* 1,539.9*   Imaging   CT ABDOMEN PELVIS W CONTRAST Result Date: 11/30/2023 CLINICAL DATA:  Crohn's exacerbation, rectal pain EXAM: CT ABDOMEN AND PELVIS WITH CONTRAST TECHNIQUE: Multidetector CT imaging of the abdomen and pelvis was performed using the standard protocol following bolus administration of intravenous contrast. RADIATION DOSE REDUCTION: This exam was performed according to the departmental dose-optimization program which  includes automated exposure control, adjustment of the mA and/or kV according to patient size and/or use of iterative reconstruction technique. CONTRAST:  75mL OMNIPAQUE  IOHEXOL  350 MG/ML SOLN COMPARISON:  CT of the abdomen pelvis performed October 07, 2023 FINDINGS: Lower chest: Nothing significant. Hepatobiliary: No focal liver abnormality is seen. No gallstones, gallbladder wall thickening, or biliary dilatation. Pancreas: Unremarkable. No pancreatic ductal dilatation or surrounding inflammatory changes. Spleen: Normal in size without focal abnormality. Adrenals/Urinary Tract: The adrenal glands are within normal limits. Unchanged appearance of 2 cm left lower pole renal cyst. Stomach/Bowel: The stomach is nondilated. Scattered air-fluid levels are present within the small bowel. There are segments of small bowel which are mildly dilated in diameter and fluid-filled. There is no clear transition point. Wall thickening is suspected in the left colon with mild inflammatory stranding in the adjacent fat. The rectum is decompressed and wall thickening is favored. There is a small focus of soft tissue thickening in the medial right gluteal soft tissues with a possible small focus of air annotated on image 93 of series 5. This may represent a small perianal abscess or site of soft tissue infection. Vascular/Lymphatic: Mildly prominent, but not clearly pathologically enlarged lymph nodes are present. No significant vascular abnormality is appreciated. Reproductive: Nothing significant. Other: No abdominal wall hernia or abnormality. No abdominopelvic ascites. Musculoskeletal: No acute or significant osseous findings. IMPRESSION: 1. Small focus of soft tissue thickening and air in the medial right gluteal soft tissues, possibly representing a small perianal abscess or soft tissue infection. 2. Diffuse rectal wall thickening which can be observed in the setting of inflammatory bowel disease. 3. Mild diffuse thickening of  the left colon, similar when compared to CT performed October 07, 2023. 4. No evidence of peritoneal abscess. Electronically Signed   By: Reagan Camera M.D.   On: 11/30/2023 14:36   CT Angio Chest PE W and/or Wo Contrast Result Date: 11/30/2023 CLINICAL DATA:  Pulmonary embolism suspected EXAM: CT ANGIOGRAPHY CHEST WITH CONTRAST TECHNIQUE: Multidetector CT imaging of the chest was performed using the standard protocol during bolus administration of intravenous contrast. Multiplanar CT image reconstructions and MIPs were obtained to evaluate the vascular anatomy. Multiplanar image (3D post-processing) reconstructions and MIPs were obtained to evaluate the  vascular anatomy. RADIATION DOSE REDUCTION: This exam was performed according to the departmental dose-optimization program which includes automated exposure control, adjustment of the mA and/or kV according to patient size and/or use of iterative reconstruction technique. CONTRAST:  75mL OMNIPAQUE  IOHEXOL  350 MG/ML SOLN COMPARISON:  CT angiogram of the chest performed October 07, 2023 FINDINGS: Cardiovascular: Enlarged heart. Main pulmonary artery is within normal limits for size. No evidence of pulmonary embolism to the segmental level. Mediastinum/Nodes: No enlarged mediastinal, hilar, or axillary lymph nodes. Thyroid gland, trachea, and esophagus demonstrate no significant findings. Lungs/Pleura: Lungs are clear. No pleural effusion or pneumothorax. Upper Abdomen: Reported separately Musculoskeletal: No chest wall abnormality. No acute or significant osseous findings. Review of the MIP images confirms the above findings. IMPRESSION: 1. No evidence of pulmonary embolism to the segmental level. Electronically Signed   By: Reagan Camera M.D.   On: 11/30/2023 14:26   Medications:    Current Medications:  sodium chloride  flush  3 mL Intravenous Q12H   Infusions:  cefTRIAXone  (ROCEPHIN )  IV 1 g (11/30/23 1544)   [START ON 12/01/2023] cefTRIAXone  (ROCEPHIN )  IV      metronidazole      Patient Profile   Shaun [name] is a 38 y.o. male with history of CAD w/ prior MI in 2017, IV drug abuse, ischemic cardiomyopathy s/p ICD, chronic systolic CHF, HTN, Crohn's disease with perianal fistula, untreated chronic HepC, hx DVT/PE 10/24.   Assessment/Plan   1. Acute on Chronic Biventricular Systolic Heart Failure: Mixed ischemic/nonischemic CM. EF 25% in 2017 s/p STEMI. Has long history of IV drug use, had been off drugs while incarcerated, has since been released.  EF persistently low <20% in 9/24 with endocarditis, s/p ICD 10/24. Recent decompensation 3/25, EF remains <20% (had been off all GDMT). CMR with LVEF 13%, no LV thrombus, dilated RV w mod HK, LGE related to MI.  - BNP 1540. Volume up on exam.  - Will hold off on diuresis and afterload reduction pending surgical plan  - Resume digoxin  0.125 mcg daily.  - Hold beta blocker with volume overload  - No SGLT2i with perianal fistula and rash - Not an advanced therapies candidate ATM d/t drug use and noncompliance. USD + for amphetamine and opiates 3/25.    2. CAD: STEMI in 2017 in setting of cocaine, heroin and tobacco use. S/p PTCA/DES to ostial/prox LAD and mid LAD, PTCA to D1. - Continue statin + ASA   3. MR: Severe on echo. Mod-severe on CMR. With severity of HF, would likely not benefit from MitralClip.   4. Hx VT: S/p ICD placement    5. Possible LV thrombus: Could not r/o on echo 3/25, no evidence of thrombus on cMRI.  - continue Eliquis  5 mg bid     6. Polysubstance abuse: H/o IV drug use. Cocaine, Methamphetamine, Heroin, Tobacco. - UDS 3/25 + for opiates, amphetamines, and THC - repeat UDS   7. Rectal bleeding - r/t perianal fistula and fissures - on abx per primary. - hgb stable - consult to general surgery  8. Concern for sepsis - code sepsis called in ED - blood cultures pending  Length of Stay: 0  [place] Lee, NP  11/30/2023, 4:11 PM  Advanced Heart Failure Team Pager  [phone] (M-F; 7a - 5p)  Please contact [facility] for night-coverage after hours (4p -7a ) and weekends on [email or web address]  Patient seen and examined with the above-signed Advanced Practice Provider and/or Housestaff. I personally reviewed laboratory data, imaging studies and  relevant notes. I independently examined the patient and formulated the important aspects of the plan. I have edited the note to reflect any of my changes or salient points. I have personally discussed the plan with the patient and/or family.  38 yo with polysubstance abuse, Crohn's disease, CAD with previous anterior infarct and systolic HF due to iCM.   Recently incarcerated. Presents today with rectal bleeding.  HF team asked to see to help with management  Has not been compliant with HF meds. Says he hurts everywhere.   General:  Chronically ill appearing No resp difficulty HEENT: normal Neck: supple. JVP 8-9 Carotids 2+ bilat; no bruits. No lymphadenopathy or thryomegaly appreciated. Cor:  Regular rate & rhythm. 2/6 MR Lungs: clear Abdomen: soft, mildly tender, nondistended. No hepatosplenomegaly. No bruits or masses. Good bowel sounds. Extremities: no cyanosis, clubbing, rash, trace edema multiple tattoos Neuro: alert & orientedx3, cranial nerves grossly intact. moves all 4 extremities w/o difficulty. Affect pleasant  Overall stable from HF perspective. CVP up a bit but given bleeding and poor po intake will not diurese tonight. If clinically stable can start diuresis in am. Attempt to re-establish GDMT as tolerated.   Not candidate for advanced HF therapies.   Jules Oar, MD  11:33 AM

## 2023-11-30 NOTE — ED Provider Notes (Signed)
 [place] EMERGENC[facility] DEPARTMENT AT [facility] Provider Note   CSN: [id number] Arrival date & time: 11/30/23  1005     History  Chief Complaint  Patient presents with   Rectal Pain    Rad Shroff is a 38 y.o. male.  HPI   38 year old male with medical history significant for CAD, HTN, ischemic cardiomyopathy with an ICD in place, DVT, nicotine  dependence, IV drug abuse, NSTEMI, Crohn's disease who presents to the emergency department with rectal bleeding, syncope, fatigue, lightheadedness, rectal pain, abdominal pain.  The patient has a history of CHF with reduced EF, PE and DVT, MSSA bacteremia, Crohn's disease, history of IV drug abuse presenting to the emergency department with above symptoms.  The patient states that he has had several days of bright red blood per rectum.  He endorses severe rectal pain with left lower quadrant abdominal pain as well.  He is concern for rectal abscess and/or fistula.  No fevers or chills.  Last night he was lightheaded and subsequently syncopized.  He states that he stopped taking his Bumex 1 week ago when he had developed worsening abdominal and rectal pain.  Home Medications Prior to Admission medications   Medication Sig Start Date End Date Taking? Authorizing Provider  apixaban  (ELIQUIS ) 5 MG TABS tablet Take 1 tablet (5 mg total) by mouth 2 (two) times daily. 10/12/23  Yes Pokhrel, Laxman, MD  aspirin  EC 81 MG tablet Take 81 mg by mouth daily. Swallow whole.   Yes [provider]  carvedilol  (COREG ) 3.125 MG tablet Take 1 tablet (3.125 mg total) by mouth 2 (two) times daily with a meal. 01/08/20  Yes Kraig [place], MD  digoxin  (LANOXIN ) 0.125 MG tablet Take 1 tablet (0.125 mg total) by mouth daily. 10/13/23 01/11/24 Yes Pokhrel, Laxman, MD  torsemide  (DEMADEX ) 20 MG tablet Take 2 tablets (40 mg total) by mouth daily. Patient taking differently: Take 20 mg by mouth daily. 10/19/23  Yes Lee, [place], NP  losartan  (COZAAR ) 25  MG tablet Take 0.5 tablets (12.5 mg total) by mouth daily. Patient not taking: Reported on 11/30/2023 10/19/23 01/17/24  Lee, [place], NP  polyethylene glycol powder (GLYCOLAX /MIRALAX ) 17 GM/SCOOP powder Take 1 capful (17 g) by mouth daily as needed for mild constipation. Patient not taking: Reported on 11/30/2023 10/12/23   Pokhrel, Laxman, MD  spironolactone  (ALDACTONE ) 25 MG tablet Take 1 tablet (25 mg total) by mouth daily. Patient not taking: Reported on 11/30/2023 10/19/23   Lee, [place], NP      Allergies    Penicillins, Codeine, and Ketorolac     Review of Systems   Review of Systems  Physical Exam Updated Vital Signs BP (!) 121/95   Pulse (!) 101   Temp 98.5 F (36.9 C) (Oral)   Resp (!) 32   Ht 6' (1.829 m)   Wt 77.1 kg   SpO2 99%   BMI 23.06 kg/m  Physical Exam Vitals and nursing note reviewed. Exam conducted with a chaperone present.  Constitutional:      General: He is not in acute distress.    Appearance: He is well-developed.  HENT:     Head: Normocephalic and atraumatic.     Mouth/Throat:     Mouth: Mucous membranes are dry.  Eyes:     Conjunctiva/sclera: Conjunctivae normal.  Cardiovascular:     Rate and Rhythm: Normal rate and regular rhythm.     Heart sounds: No murmur heard. Pulmonary:     Effort: Pulmonary effort is normal.  No respiratory distress.     Breath sounds: Normal breath sounds.  Abdominal:     Palpations: Abdomen is soft.     Tenderness: There is abdominal tenderness. There is guarding.  Genitourinary:    Rectum: Guaiac result positive. Tenderness present.     Comments: Fistulas present, rectal erythema and TTP, fecal occult positive Musculoskeletal:        General: No swelling.     Cervical back: Neck supple.  Skin:    General: Skin is warm and dry.     Capillary Refill: Capillary refill takes less than 2 seconds.  Neurological:     Mental Status: He is alert.  Psychiatric:        Mood and Affect: Mood normal.     ED Results /  Procedures / Treatments   Labs (all labs ordered are listed, but only abnormal results are displayed) Labs Reviewed  COMPREHENSIVE METABOLIC PANEL WITH GFR - Abnormal; Notable for the following components:      Result Value   Glucose, Bld 104 (*)    Calcium  8.2 (*)    Albumin 2.8 (*)    All other components within normal limits  CBC - Abnormal; Notable for the following components:   WBC 10.8 (*)    Hemoglobin 9.5 (*)    HCT 33.9 (*)    MCV 65.2 (*)    MCH 18.3 (*)    MCHC 28.0 (*)    RDW 19.7 (*)    Platelets 448 (*)    All other components within normal limits  BRAIN NATRIURETIC PEPTIDE - Abnormal; Notable for the following components:   B Natriuretic Peptide 1,539.9 (*)    All other components within normal limits  POC OCCULT BLOOD, ED - Abnormal; Notable for the following components:   Fecal Occult Bld POSITIVE (*)    All other components within normal limits  CULTURE, BLOOD (ROUTINE X 2)  CULTURE, BLOOD (ROUTINE X 2)  I-STAT CG4 LACTIC ACID, ED  TYPE AND SCREEN  ABO/RH  TROPONIN I (HIGH SENSITIVITY)    EKG EKG Interpretation Date/Time:  Monday Nov 30 2023 10:52:24 EDT Ventricular Rate:  109 PR Interval:  170 QRS Duration:  92 QT Interval:  364 QTC Calculation: 490 R Axis:   -44  Text Interpretation: Sinus tachycardia with occasional and consecutive Premature ventricular complexes Possible Left atrial enlargement Left axis deviation Left ventricular hypertrophy with repolarization abnormality ( Cornell product ) Abnormal ECG When compared with ECG of 19-Oct-2023 12:08, PREVIOUS ECG IS PRESENT Confirmed by Rosealee Concha ([id number]) on 11/30/2023 12:59:04 PM  Radiology CT ABDOMEN PELVIS W CONTRAST Result Date: 11/30/2023 CLINICAL DATA:  Crohn's exacerbation, rectal pain EXAM: CT ABDOMEN AND PELVIS WITH CONTRAST TECHNIQUE: Multidetector CT imaging of the abdomen and pelvis was performed using the standard protocol following bolus administration of intravenous contrast.  RADIATION DOSE REDUCTION: This exam was performed according to the departmental dose-optimization program which includes automated exposure control, adjustment of the mA and/or kV according to patient size and/or use of iterative reconstruction technique. CONTRAST:  75mL OMNIPAQUE  IOHEXOL  350 MG/ML SOLN COMPARISON:  CT of the abdomen pelvis performed October 07, 2023 FINDINGS: Lower chest: Nothing significant. Hepatobiliary: No focal liver abnormality is seen. No gallstones, gallbladder wall thickening, or biliary dilatation. Pancreas: Unremarkable. No pancreatic ductal dilatation or surrounding inflammatory changes. Spleen: Normal in size without focal abnormality. Adrenals/Urinary Tract: The adrenal glands are within normal limits. Unchanged appearance of 2 cm left lower pole renal cyst. Stomach/Bowel: The stomach is nondilated. Scattered  air-fluid levels are present within the small bowel. There are segments of small bowel which are mildly dilated in diameter and fluid-filled. There is no clear transition point. Wall thickening is suspected in the left colon with mild inflammatory stranding in the adjacent fat. The rectum is decompressed and wall thickening is favored. There is a small focus of soft tissue thickening in the medial right gluteal soft tissues with a possible small focus of air annotated on image 93 of series 5. This may represent a small perianal abscess or site of soft tissue infection. Vascular/Lymphatic: Mildly prominent, but not clearly pathologically enlarged lymph nodes are present. No significant vascular abnormality is appreciated. Reproductive: Nothing significant. Other: No abdominal wall hernia or abnormality. No abdominopelvic ascites. Musculoskeletal: No acute or significant osseous findings. IMPRESSION: 1. Small focus of soft tissue thickening and air in the medial right gluteal soft tissues, possibly representing a small perianal abscess or soft tissue infection. 2. Diffuse rectal wall  thickening which can be observed in the setting of inflammatory bowel disease. 3. Mild diffuse thickening of the left colon, similar when compared to CT performed October 07, 2023. 4. No evidence of peritoneal abscess. Electronically Signed   By: Reagan Camera M.D.   On: 11/30/2023 14:36   CT Angio Chest PE W and/or Wo Contrast Result Date: 11/30/2023 CLINICAL DATA:  Pulmonary embolism suspected EXAM: CT ANGIOGRAPHY CHEST WITH CONTRAST TECHNIQUE: Multidetector CT imaging of the chest was performed using the standard protocol during bolus administration of intravenous contrast. Multiplanar CT image reconstructions and MIPs were obtained to evaluate the vascular anatomy. Multiplanar image (3D post-processing) reconstructions and MIPs were obtained to evaluate the vascular anatomy. RADIATION DOSE REDUCTION: This exam was performed according to the departmental dose-optimization program which includes automated exposure control, adjustment of the mA and/or kV according to patient size and/or use of iterative reconstruction technique. CONTRAST:  75mL OMNIPAQUE  IOHEXOL  350 MG/ML SOLN COMPARISON:  CT angiogram of the chest performed October 07, 2023 FINDINGS: Cardiovascular: Enlarged heart. Main pulmonary artery is within normal limits for size. No evidence of pulmonary embolism to the segmental level. Mediastinum/Nodes: No enlarged mediastinal, hilar, or axillary lymph nodes. Thyroid gland, trachea, and esophagus demonstrate no significant findings. Lungs/Pleura: Lungs are clear. No pleural effusion or pneumothorax. Upper Abdomen: Reported separately Musculoskeletal: No chest wall abnormality. No acute or significant osseous findings. Review of the MIP images confirms the above findings. IMPRESSION: 1. No evidence of pulmonary embolism to the segmental level. Electronically Signed   By: Reagan Camera M.D.   On: 11/30/2023 14:26    Procedures .Critical Care  Performed by: Rosealee Concha, MD Authorized by: Rosealee Concha, MD   Critical care provider statement:    Critical care time (minutes):  30   Critical care was necessary to treat or prevent imminent or life-threatening deterioration of the following conditions:  Sepsis   Critical care was time spent personally by me on the following activities:  Development of treatment plan with patient or surrogate, discussions with consultants, evaluation of patient's response to treatment, examination of patient, ordering and review of laboratory studies, ordering and review of radiographic studies, ordering and performing treatments and interventions, pulse oximetry, re-evaluation of patient's condition and review of old charts   Care discussed with: admitting provider       Medications Ordered in ED Medications  cefTRIAXone  (ROCEPHIN ) 1 g in sodium chloride  0.9 % 100 mL IVPB (has no administration in time range)  metroNIDAZOLE  (FLAGYL ) IVPB 500 mg (has no administration  in time range)  sodium chloride  flush (NS) 0.9 % injection 3 mL (has no administration in time range)  acetaminophen  (TYLENOL ) tablet 650 mg (has no administration in time range)    Or  acetaminophen  (TYLENOL ) suppository 650 mg (has no administration in time range)  cefTRIAXone  (ROCEPHIN ) 2 g in sodium chloride  0.9 % 100 mL IVPB (has no administration in time range)  fentaNYL  (SUBLIMAZE ) injection 50 mcg (50 mcg Intravenous Given 11/30/23 1128)  sodium chloride  0.9 % bolus 1,000 mL (0 mLs Intravenous Stopped 11/30/23 1346)  fentaNYL  (SUBLIMAZE ) injection 50 mcg (50 mcg Intravenous Given 11/30/23 1325)  iohexol  (OMNIPAQUE ) 350 MG/ML injection 75 mL (75 mLs Intravenous Contrast Given 11/30/23 1321)  HYDROmorphone  (DILAUDID ) injection 1 mg (1 mg Intravenous Given 11/30/23 1420)    ED Course/ Medical Decision Making/ A&P                                 Medical Decision Making Amount and/or Complexity of Data Reviewed Labs: ordered. Radiology: ordered.  Risk Prescription drug  management. Decision regarding hospitalization.    38 year old male with medical history significant for CAD, HTN, ischemic cardiomyopathy with an ICD in place, DVT, nicotine  dependence, IV drug abuse, NSTEMI, Crohn's disease who presents to the emergency department with rectal bleeding, syncope, fatigue, lightheadedness, rectal pain, abdominal pain.  The patient has a history of CHF with reduced EF, PE and DVT, MSSA bacteremia, Crohn's disease, history of IV drug abuse presenting to the emergency department with above symptoms.  The patient states that he has had several days of bright red blood per rectum.  He endorses severe rectal pain with left lower quadrant abdominal pain as well.  He is concern for rectal abscess and/or fistula.  No fevers or chills.  Last night he was lightheaded and subsequently syncopized.  He states that he stopped taking his Bumex 1 week ago when he had developed worsening abdominal and rectal pain.  On arrival, the patient was afebrile, mildly tachycardic heart rate 107, BP 133/100, saturating 100% on room air, not tachypneic RR 18.  Patient presenting with concern for Crohn's flare, bright red blood per rectum.  Initially administered IV fluids for volume resuscitation as well as pain management with IV fentanyl  and IV Dilaudid .  Patient now stating he is also having some shortness of breath and does have a history of CHF with an ICD in place.  Will carefully monitor the patient's respiratory symptoms although not tachypneic at this time with no rales on exam.  Not appear to be volume overloaded.  CTA PE:  IMPRESSION:  1. No evidence of pulmonary embolism to the segmental level.    CT Abdomen Pelvis:  IMPRESSION:  1. Small focus of soft tissue thickening and air in the medial right  gluteal soft tissues, possibly representing a small perianal abscess  or soft tissue infection.  2. Diffuse rectal wall thickening which can be observed in the  setting of inflammatory  bowel disease.  3. Mild diffuse thickening of the left colon, similar when compared  to CT performed October 07, 2023.  4. No evidence of peritoneal abscess.    Labs: Fecal occult blood positive BNP elevated to 1540.  CBC with leukocytosis 10.8, hemoglobin 9.5, stable from previous measurements. Troponin 10.   GI Consult: Spoke with Esmeralda Hedge, NP, who will see patient in consultation and provide recommendations regarding the patient's Crohn's disease.  Patient with likely perirectal cellulitis,  Crohn's flare, plan for GI consultation and medicine admission.  Patient started on IV antibiotics, Rocephin  and Flagyl  ordered.  Has been fluid resuscitated with 1 L however will caution additional fluid administration given the patient's history of CHF last EF of less than 20%.  No evidence of pulmonary edema or PE on CT imaging.  No abscess as patient has a visible fistula seen on exam and erythema consistent with cellulitis and air seen on CT is likely due to the patient's fistula.  The finding of perirectal cellulitis, patient with tachycardia, leukocytosis, concern for sepsis from this, patient fluid resuscitated with 1 L, we will not administer further fluids given his CHF, Rocephin  and Flagyl  ordered for further management. Medicine consulted for admission, Dr. Melvin accepting.   Final Clinical Impression(s) / ED Diagnoses Final diagnoses:  Rectal pain  Bright red rectal bleeding  Left lower quadrant abdominal pain  Acute on chronic congestive heart failure, unspecified heart failure type (HCC)  Perirectal cellulitis  Sepsis, due to unspecified organism, unspecified whether acute organ dysfunction present [facility])    Rx / DC Orders ED Discharge Orders     None         Rosealee Concha, MD 11/30/23 1542

## 2023-11-30 NOTE — ED Notes (Signed)
 Report given to 6E RN

## 2023-11-30 NOTE — ED Notes (Signed)
 Unable to obtain blood culture at this time. IV started.

## 2023-11-30 NOTE — ED Triage Notes (Signed)
 Patient presents to ed via [facility] from home c/o rectal pain onset 4 days ago  with some bleeding , states he was here for the same and hospitalized with anal fissure, c/o sob and abd. Pain. States he was given medication however medication is making him very sick

## 2023-12-01 DIAGNOSIS — I5082 Biventricular heart failure: Secondary | ICD-10-CM | POA: Diagnosis present

## 2023-12-01 DIAGNOSIS — E782 Mixed hyperlipidemia: Secondary | ICD-10-CM | POA: Diagnosis present

## 2023-12-01 DIAGNOSIS — K50113 Crohn's disease of large intestine with fistula: Secondary | ICD-10-CM | POA: Diagnosis present

## 2023-12-01 DIAGNOSIS — Z8674 Personal history of sudden cardiac arrest: Secondary | ICD-10-CM | POA: Diagnosis not present

## 2023-12-01 DIAGNOSIS — Z5329 Procedure and treatment not carried out because of patient's decision for other reasons: Secondary | ICD-10-CM | POA: Diagnosis present

## 2023-12-01 DIAGNOSIS — Z7901 Long term (current) use of anticoagulants: Secondary | ICD-10-CM | POA: Diagnosis not present

## 2023-12-01 DIAGNOSIS — R768 Other specified abnormal immunological findings in serum: Secondary | ICD-10-CM | POA: Diagnosis not present

## 2023-12-01 DIAGNOSIS — K603 Anal fistula, unspecified: Secondary | ICD-10-CM

## 2023-12-01 DIAGNOSIS — K50111 Crohn's disease of large intestine with rectal bleeding: Secondary | ICD-10-CM | POA: Diagnosis present

## 2023-12-01 DIAGNOSIS — F17210 Nicotine dependence, cigarettes, uncomplicated: Secondary | ICD-10-CM | POA: Diagnosis present

## 2023-12-01 DIAGNOSIS — K6289 Other specified diseases of anus and rectum: Secondary | ICD-10-CM | POA: Diagnosis present

## 2023-12-01 DIAGNOSIS — G8929 Other chronic pain: Secondary | ICD-10-CM | POA: Diagnosis present

## 2023-12-01 DIAGNOSIS — K922 Gastrointestinal hemorrhage, unspecified: Secondary | ICD-10-CM | POA: Diagnosis not present

## 2023-12-01 DIAGNOSIS — I2510 Atherosclerotic heart disease of native coronary artery without angina pectoris: Secondary | ICD-10-CM | POA: Diagnosis present

## 2023-12-01 DIAGNOSIS — D638 Anemia in other chronic diseases classified elsewhere: Secondary | ICD-10-CM | POA: Diagnosis present

## 2023-12-01 DIAGNOSIS — B182 Chronic viral hepatitis C: Secondary | ICD-10-CM | POA: Diagnosis present

## 2023-12-01 DIAGNOSIS — I5023 Acute on chronic systolic (congestive) heart failure: Secondary | ICD-10-CM | POA: Diagnosis present

## 2023-12-01 DIAGNOSIS — F1410 Cocaine abuse, uncomplicated: Secondary | ICD-10-CM | POA: Diagnosis present

## 2023-12-01 DIAGNOSIS — F322 Major depressive disorder, single episode, severe without psychotic features: Secondary | ICD-10-CM | POA: Diagnosis present

## 2023-12-01 DIAGNOSIS — K625 Hemorrhage of anus and rectum: Secondary | ICD-10-CM | POA: Diagnosis not present

## 2023-12-01 DIAGNOSIS — I160 Hypertensive urgency: Secondary | ICD-10-CM | POA: Diagnosis present

## 2023-12-01 DIAGNOSIS — Z955 Presence of coronary angioplasty implant and graft: Secondary | ICD-10-CM | POA: Diagnosis not present

## 2023-12-01 DIAGNOSIS — D509 Iron deficiency anemia, unspecified: Secondary | ICD-10-CM | POA: Diagnosis not present

## 2023-12-01 DIAGNOSIS — I255 Ischemic cardiomyopathy: Secondary | ICD-10-CM | POA: Diagnosis present

## 2023-12-01 DIAGNOSIS — Z95810 Presence of automatic (implantable) cardiac defibrillator: Secondary | ICD-10-CM | POA: Diagnosis not present

## 2023-12-01 DIAGNOSIS — I340 Nonrheumatic mitral (valve) insufficiency: Secondary | ICD-10-CM | POA: Diagnosis present

## 2023-12-01 DIAGNOSIS — I428 Other cardiomyopathies: Secondary | ICD-10-CM | POA: Diagnosis present

## 2023-12-01 DIAGNOSIS — D62 Acute posthemorrhagic anemia: Secondary | ICD-10-CM | POA: Diagnosis present

## 2023-12-01 DIAGNOSIS — I110 Hypertensive heart disease with heart failure: Secondary | ICD-10-CM | POA: Diagnosis present

## 2023-12-01 DIAGNOSIS — K5010 Crohn's disease of large intestine without complications: Secondary | ICD-10-CM | POA: Diagnosis present

## 2023-12-01 DIAGNOSIS — Z885 Allergy status to narcotic agent status: Secondary | ICD-10-CM | POA: Diagnosis not present

## 2023-12-01 LAB — COMPREHENSIVE METABOLIC PANEL WITH GFR
ALT: 9 U/L (ref 0–44)
AST: 14 U/L — ABNORMAL LOW (ref 15–41)
Albumin: 2.4 g/dL — ABNORMAL LOW (ref 3.5–5.0)
Alkaline Phosphatase: 62 U/L (ref 38–126)
Anion gap: 9 (ref 5–15)
BUN: 17 mg/dL (ref 6–20)
CO2: 20 mmol/L — ABNORMAL LOW (ref 22–32)
Calcium: 8.1 mg/dL — ABNORMAL LOW (ref 8.9–10.3)
Chloride: 107 mmol/L (ref 98–111)
Creatinine, Ser: 1.01 mg/dL (ref 0.61–1.24)
GFR, Estimated: >60 mL/min (ref >60)
Glucose, Bld: 102 mg/dL — ABNORMAL HIGH (ref 70–99)
Potassium: 4.3 mmol/L (ref 3.5–5.1)
Sodium: 136 mmol/L (ref 135–145)
Total Bilirubin: 0.5 mg/dL (ref 0.0–1.2)
Total Protein: 6 g/dL — ABNORMAL LOW (ref 6.5–8.1)

## 2023-12-01 LAB — CBC
HCT: 28.7 % — ABNORMAL LOW (ref 39.0–52.0)
Hemoglobin: 8.3 g/dL — ABNORMAL LOW (ref 13.0–17.0)
MCH: 18.7 pg — ABNORMAL LOW (ref 26.0–34.0)
MCHC: 28.9 g/dL — ABNORMAL LOW (ref 30.0–36.0)
MCV: 64.6 fL — ABNORMAL LOW (ref 80.0–100.0)
Platelets: 384 10*3/uL (ref 150–400)
RBC: 4.44 MIL/uL (ref 4.22–5.81)
RDW: 19.4 % — ABNORMAL HIGH (ref 11.5–15.5)
WBC: 9.5 10*3/uL (ref 4.0–10.5)
nRBC: 0.2 % (ref 0.0–0.2)

## 2023-12-01 LAB — LACTIC ACID, PLASMA: Lactic Acid, Venous: 0.9 mmol/L (ref 0.5–1.9)

## 2023-12-01 LAB — PROTIME-INR
INR: 1.3 — ABNORMAL HIGH (ref 0.8–1.2)
Prothrombin Time: 16.2 s — ABNORMAL HIGH (ref 11.4–15.2)

## 2023-12-01 LAB — ABO/RH: ABO/RH(D): A POS

## 2023-12-01 LAB — PROCALCITONIN: Procalcitonin: <0.1 ng/mL

## 2023-12-01 MED ORDER — ATORVASTATIN CALCIUM 40 MG PO TABS
40.0000 mg | ORAL_TABLET | Freq: Every day | ORAL | Status: DC
  Administered 2023-12-01 – 2023-12-03 (×3): 40 mg via ORAL
  Filled 2023-12-01 (×3): qty 1

## 2023-12-01 MED ORDER — FUROSEMIDE 10 MG/ML IJ SOLN: 40.0000 mg | Freq: Once | INTRAMUSCULAR | Status: DC

## 2023-12-01 MED ORDER — FUROSEMIDE 10 MG/ML IJ SOLN
40.0000 mg | Freq: Two times a day (BID) | INTRAMUSCULAR | Status: AC
  Administered 2023-12-01 (×2): 40 mg via INTRAVENOUS
  Filled 2023-12-01 (×2): qty 4

## 2023-12-01 MED ORDER — SPIRONOLACTONE 12.5 MG HALF TABLET
12.5000 mg | ORAL_TABLET | Freq: Every day | ORAL | Status: DC
  Administered 2023-12-01 – 2023-12-03 (×3): 12.5 mg via ORAL
  Filled 2023-12-01 (×3): qty 1

## 2023-12-01 MED ORDER — POTASSIUM CHLORIDE CRYS ER 20 MEQ PO TBCR
20.0000 meq | EXTENDED_RELEASE_TABLET | Freq: Once | ORAL | Status: AC
  Administered 2023-12-01: 20 meq via ORAL
  Filled 2023-12-01: qty 1

## 2023-12-01 NOTE — Progress Notes (Signed)
 PROGRESS NOTE    Shaun [name]  ZOX:[id number] DOB: 01-Dec-1985 DOA: 11/30/2023 PCP: Otis Blocker, PA   Brief Narrative:  HPI: Shaun [name] is a 38 y.o. male with medical history significant of hypertension, hyperlipidemia, ventricular tachycardia status post ICD, CAD, Crohn's disease, hepatitis C, DVT/PE, substance use, depression, chronic systolic CHF presenting with rectal pain and now rectal bleeding.   Patient has had around 1 week of worsening rectal pain and left lower quadrant pain.  For the last 3 days he has had some bright red blood per rectum.  Also reports fatigue, lightheadedness and a syncopal event yesterday during episode of lightheadedness.  Known history of Crohn's disease.  Not currently on management due to having been incarcerated until his most recent admission.   He was seen in March of this year for acute on chronic CHF with severely reduced EF and possible LV thrombus with severe mitral regurgitation.  He was discharged on Eliquis .   He is off most of his medications and stopped his diuretic about a week ago when he started having the pain.  Intermittently takes Eliquis  and carvedilol .   Had some shortness of breath. Denies fevers, chills, chest pain.   ED Course: Vital signs in the ED notable for blood pressure in the 120s-130s systolic, heart rate in the 100s, respiratory rate in the 20s.  Lab workup included CMP with glucose 24, calcium  8.2, albumin 2.8.  CBC with leukocytosis to 10.8, hemoglobin currently stable at 9.5, platelets 440.  Troponin normal.  BNP elevated to 1539.  FOBT positive.  Patient typed and screened in the ED.  Blood cultures pending.  Imaging studies included a CTA PE study which was negative for PE or other acute abnormality.  CT abdomen pelvis showed changes consistent with small perirectal abscess as well as rectal wall thickening possibly consistent with his IBD and colonic thickening which was similar to prior.  Patient received  fentanyl , Dilaudid , ceftriaxone , Flagyl , 1 L IV fluids in the ED.  GI consulted and are seeing the patient.  Assessment & Plan:   Principal Problem:   Acute GI bleeding Active Problems:   History of DVT (deep vein thrombosis)   Coronary artery disease involving native coronary artery of native heart without angina pectoris   Essential hypertension   Mixed hyperlipidemia   Chronic systolic CHF (congestive heart failure) (HCC)   AICD (automatic cardioverter/defibrillator) present   Chronic hepatitis C virus infection (HCC)   Crohn's disease (HCC)   Hx of ventricular tachycardia   Severe major depression without psychotic features (HCC)   Substance abuse (HCC)   Crohn's colitis (HCC)   Perirectal cellulitis   Rectal pain   Crohn's disease of perianal region with abscess (HCC)  Rectal bleed / Crohn's flare / Perirectal cellulitis Known history of Crohn's disease but not on medical management beyond intermittent steroids due to being incarcerated until recently. Concern exam for perirectal cellulitis.  FOBT positive.  Hemoglobin remained stable at 9.5 on first check.  CT abdomen pelvis with rectal wall thickening which is new, colonic wall thickening which is similar, changes consistent with perirectal abscess possibly fistula.  Seen by general surgery but general surgery does not think he has abscess.  Seen by GI, started on ceftriaxone , Flagyl , per GI rec lesions holding off on steroids for now while treating for his presumed abscess.  Blood cultures pending.  Iron  deficiency and anemia of chronic disease/anemia due to GI bleed: Hemoglobin is stable.  Monitor closely.  FOBT positive.  May benefit from IV iron .  Will defer to GI.   Acute on chronic combined systolic and diastolic/biventricular CHF Mixed ischemic/nonischemic CM. EF 25% in 2017 s/p STEMI. Has long history of IV drug use, had been off drugs while incarcerated, has since been released.  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 Recent decompensation 3/25, EF remains <20% (had been off all GDMT). CMR with LVEF 13%, no LV thrombus, dilated RV w mod HK, LGE related to MI.  - BNP 1540.  Cardiology on board and managing.  Holding diuretics and beta-blocker, resumed digoxin . No SGLT2i with perianal fistula and rash - Not an advanced therapies candidate ATM d/t drug use and noncompliance. USD + for amphetamine and opiates 3/25.   Hypertensive urgency Blood pressure was elevated in the ED up to 100 diastolic but blood pressure within normal range now despite of holding Coreg  and losartan .   History of V. Tach - Had previously been on antiarrhythmics but is no longer - Status post ICD   History of DVT and PE - Holding Eliquis  as above   Chronic hepatitis C - Noted   History of cocaine and methamphetamine use - Noted  THC abuse: UDS positive for THC.  HCV antibodies positive: Noted.  DVT prophylaxis: SCDs Start: 11/30/23 1533   Code Status: Full Code  Family Communication:  None present at bedside.  Plan of care discussed with patient in length and he/she verbalized understanding and agreed with it.  Status is: Observation The patient will require care spanning > 2 midnights and should be moved to inpatient because: Still in pain, needs further management by GI   Estimated body mass index is 22.28 kg/m as calculated from the following:   Height as of this encounter: 6' (1.829 m).   Weight as of this encounter: 74.5 kg.    Nutritional Assessment: Body mass index is 22.28 kg/m.Aaron Aas Seen by dietician.  I agree with the assessment and plan as outlined below: Nutrition Status:        . Skin Assessment: I have examined the patient's skin and I agree with the wound assessment as performed by the wound care RN as outlined below:    Consultants:  GI, general surgery and cardiology  Procedures:  As above  Antimicrobials:  Anti-infectives (From admission, onward)    Start      Dose/Rate Route Frequency Ordered Stop   12/01/23 1000  cefTRIAXone  (ROCEPHIN ) 2 g in sodium chloride  0.9 % 100 mL IVPB        2 g 200 mL/hr over 30 Minutes Intravenous Every 24 hours 11/30/23 1539     11/30/23 2200  metroNIDAZOLE  (FLAGYL ) IVPB 500 mg  Status:  Discontinued        500 mg 100 mL/hr over 60 Minutes Intravenous Every 12 hours 11/30/23 1539 11/30/23 1540   11/30/23 1500  cefTRIAXone  (ROCEPHIN ) 1 g in sodium chloride  0.9 % 100 mL IVPB        1 g 200 mL/hr over 30 Minutes Intravenous  Once 11/30/23 1449 11/30/23 1625   11/30/23 1500  metroNIDAZOLE  (FLAGYL ) IVPB 500 mg        500 mg 100 mL/hr over 60 Minutes Intravenous Every 12 hours 11/30/23 1449           Subjective: Patient seen and examined, still complains of rectal pain and rectal bleeding.  No other complaint.  Wants to eat.  Objective: Vitals:   11/30/23 1918 11/30/23 2123 12/01/23 0408 12/01/23 4696  BP: 109/82 (!) 138/97 112/80 112/79  Pulse: (!) 102 (!) 102 88 90  Resp: (!) 24 20 18 18   Temp: 97.8 F (36.6 C) 98.3 F (36.8 C) 97.8 F (36.6 C) 98.1 F (36.7 C)  TempSrc: Oral Oral Oral Oral  SpO2: 99% 100% 100%   Weight:  74.8 kg 74.5 kg   Height:  6' (1.829 m)      Intake/Output Summary (Last 24 hours) at 12/01/2023 0834 Last data filed at 11/30/2023 1927 Gross per 24 hour  Intake 100 ml  Output --  Net 100 ml   Filed Weights   11/30/23 1029 11/30/23 2123 12/01/23 0408  Weight: 77.1 kg 74.8 kg 74.5 kg    Examination:  General exam: Appears calm and comfortable  Respiratory system: Clear to auscultation. Respiratory effort normal. Cardiovascular system: S1 & S2 heard, RRR. No JVD, murmurs, rubs, gallops or clicks. No pedal edema. Gastrointestinal system: Abdomen is nondistended, soft and mild lower abdominal tenderness. No organomegaly or masses felt. Normal bowel sounds heard. Central nervous system: Alert and oriented. No focal neurological deficits. Extremities: Symmetric 5 x 5  power. Skin: No rashes, lesions or ulcers Psychiatry: Judgement and insight appear normal. Mood & affect appropriate.    Data Reviewed: I have personally reviewed following labs and imaging studies  CBC: Recent Labs  Lab 11/30/23 1132 12/01/23 0027  WBC 10.8* 9.5  HGB 9.5* 8.3*  HCT 33.9* 28.7*  MCV 65.2* 64.6*  PLT 448* 384   Basic Metabolic Panel: Recent Labs  Lab 11/30/23 1132 12/01/23 0027  NA 136 136  K 4.0 4.3  CL 105 107  CO2 23 20*  GLUCOSE 104* 102*  BUN 12 17  CREATININE 1.10 1.01  CALCIUM  8.2* 8.1*   GFR: Estimated Creatinine Clearance: 104.5 mL/min (by C-G formula based on SCr of 1.01 mg/dL). Liver Function Tests: Recent Labs  Lab 11/30/23 1132 12/01/23 0027  AST 19 14*  ALT 9 9  ALKPHOS 71 62  BILITOT 0.7 0.5  PROT 7.0 6.0*  ALBUMIN 2.8* 2.4*   No results for input(s): "LIPASE", "AMYLASE" in the last 168 hours. No results for input(s): "AMMONIA" in the last 168 hours. Coagulation Profile: Recent Labs  Lab 12/01/23 0027  INR 1.3*   Cardiac Enzymes: No results for input(s): "CKTOTAL", "CKMB", "CKMBINDEX", "TROPONINI" in the last 168 hours. BNP (last 3 results) No results for input(s): "PROBNP" in the last 8760 hours. HbA1C: No results for input(s): "HGBA1C" in the last 72 hours. CBG: No results for input(s): "GLUCAP" in the last 168 hours. Lipid Profile: No results for input(s): "CHOL", "HDL", "LDLCALC", "TRIG", "CHOLHDL", "LDLDIRECT" in the last 72 hours. Thyroid Function Tests: No results for input(s): "TSH", "T4TOTAL", "FREET4", "T3FREE", "THYROIDAB" in the last 72 hours. Anemia Panel: Recent Labs    11/30/23 1740  FERRITIN 6*  TIBC 427  IRON  17*   Sepsis Labs: Recent Labs  Lab 11/30/23 1749 11/30/23 2139 12/01/23 0027  PROCALCITON  --   --  <0.10  LATICACIDVEN 1.4 1.6 0.9    Recent Results (from the past 240 hours)  Blood culture (routine x 2)     Status: None (Preliminary result)   Collection Time: 11/30/23 11:02 AM    Specimen: BLOOD RIGHT HAND  Result Value Ref Range Status   Specimen Description BLOOD RIGHT HAND  Final   Special Requests   Final    BOTTLES DRAWN AEROBIC AND ANAEROBIC Blood Culture results may not be optimal due to an inadequate volume of blood received  in culture bottles   Culture   Final    NO GROWTH < 24 HOURS Performed at [facility], [address] [place], [place] [address]    Report Status PENDING  Incomplete  Blood culture (routine x 2)     Status: None (Preliminary result)   Collection Time: 11/30/23  1:42 PM   Specimen: BLOOD  Result Value Ref Range Status   Specimen Description BLOOD BLOOD RIGHT FOREARM  Final   Special Requests   Final    BOTTLES DRAWN AEROBIC AND ANAEROBIC Blood Culture results may not be optimal due to an inadequate volume of blood received in culture bottles   Culture   Final    NO GROWTH < 24 HOURS Performed at [facility], [address] [place], [place] [address]    Report Status PENDING  Incomplete     Radiology Studies: CT ABDOMEN PELVIS W CONTRAST Result Date: 11/30/2023 CLINICAL DATA:  Crohn's exacerbation, rectal pain EXAM: CT ABDOMEN AND PELVIS WITH CONTRAST TECHNIQUE: Multidetector CT imaging of the abdomen and pelvis was performed using the standard protocol following bolus administration of intravenous contrast. RADIATION DOSE REDUCTION: This exam was performed according to the departmental dose-optimization program which includes automated exposure control, adjustment of the mA and/or kV according to patient size and/or use of iterative reconstruction technique. CONTRAST:  75mL OMNIPAQUE  IOHEXOL  350 MG/ML SOLN COMPARISON:  CT of the abdomen pelvis performed October 07, 2023 FINDINGS: Lower chest: Nothing significant. Hepatobiliary: No focal liver abnormality is seen. No gallstones, gallbladder wall thickening, or biliary dilatation. Pancreas: Unremarkable. No pancreatic ductal dilatation or surrounding inflammatory changes.  Spleen: Normal in size without focal abnormality. Adrenals/Urinary Tract: The adrenal glands are within normal limits. Unchanged appearance of 2 cm left lower pole renal cyst. Stomach/Bowel: The stomach is nondilated. Scattered air-fluid levels are present within the small bowel. There are segments of small bowel which are mildly dilated in diameter and fluid-filled. There is no clear transition point. Wall thickening is suspected in the left colon with mild inflammatory stranding in the adjacent fat. The rectum is decompressed and wall thickening is favored. There is a small focus of soft tissue thickening in the medial right gluteal soft tissues with a possible small focus of air annotated on image 93 of series 5. This may represent a small perianal abscess or site of soft tissue infection. Vascular/Lymphatic: Mildly prominent, but not clearly pathologically enlarged lymph nodes are present. No significant vascular abnormality is appreciated. Reproductive: Nothing significant. Other: No abdominal wall hernia or abnormality. No abdominopelvic ascites. Musculoskeletal: No acute or significant osseous findings. IMPRESSION: 1. Small focus of soft tissue thickening and air in the medial right gluteal soft tissues, possibly representing a small perianal abscess or soft tissue infection. 2. Diffuse rectal wall thickening which can be observed in the setting of inflammatory bowel disease. 3. Mild diffuse thickening of the left colon, similar when compared to CT performed October 07, 2023. 4. No evidence of peritoneal abscess. Electronically Signed   By: Reagan Camera M.D.   On: 11/30/2023 14:36   CT Angio Chest PE W and/or Wo Contrast Result Date: 11/30/2023 CLINICAL DATA:  Pulmonary embolism suspected EXAM: CT ANGIOGRAPHY CHEST WITH CONTRAST TECHNIQUE: Multidetector CT imaging of the chest was performed using the standard protocol during bolus administration of intravenous contrast. Multiplanar CT image reconstructions  and MIPs were obtained to evaluate the vascular anatomy. Multiplanar image (3D post-processing) reconstructions and MIPs were obtained to evaluate the vascular anatomy. RADIATION  DOSE REDUCTION: This exam was performed according to the departmental dose-optimization program which includes automated exposure control, adjustment of the mA and/or kV according to patient size and/or use of iterative reconstruction technique. CONTRAST:  75mL OMNIPAQUE  IOHEXOL  350 MG/ML SOLN COMPARISON:  CT angiogram of the chest performed October 07, 2023 FINDINGS: Cardiovascular: Enlarged heart. Main pulmonary artery is within normal limits for size. No evidence of pulmonary embolism to the segmental level. Mediastinum/Nodes: No enlarged mediastinal, hilar, or axillary lymph nodes. Thyroid gland, trachea, and esophagus demonstrate no significant findings. Lungs/Pleura: Lungs are clear. No pleural effusion or pneumothorax. Upper Abdomen: Reported separately Musculoskeletal: No chest wall abnormality. No acute or significant osseous findings. Review of the MIP images confirms the above findings. IMPRESSION: 1. No evidence of pulmonary embolism to the segmental level. Electronically Signed   By: Reagan Camera M.D.   On: 11/30/2023 14:26    Scheduled Meds:  aspirin  EC  81 mg Oral Daily   atorvastatin   40 mg Oral Daily   digoxin   0.125 mg Oral Daily   furosemide   40 mg Intravenous BID   potassium chloride   20 mEq Oral Once   sodium chloride  flush  3 mL Intravenous Q12H   spironolactone   12.5 mg Oral Daily   Continuous Infusions:  cefTRIAXone  (ROCEPHIN )  IV     metronidazole  500 mg (12/01/23 0527)     LOS: 0 days   Modena Andes, MD [facility]  12/01/2023, 8:34 AM   *Please note that this is a verbal dictation therefore any spelling or grammatical errors are due to the "[employer] One" system interpretation.  Please page via Amion and do not message via secure chat for urgent patient care matters. Secure chat  can be used for non urgent patient care matters.  How to contact the [facility] Attending or Consulting provider 7A - 7P or covering provider during after hours 7P -7A, for this patient?  Check the care team in [facility] and look for a) attending/consulting [facility] provider listed and b) the [facility] team listed. Page or secure chat 7A-7P. Log into [email or web address] and use [place]'s universal password to access. If you do not have the password, please contact the hospital operator. Locate the [facility] provider you are looking for under [facility] and page to a number that you can be directly reached. If you still have difficulty reaching the provider, please page the [facility] (Director on Call) for the Hospitalists listed on amion for assistance.

## 2023-12-01 NOTE — Progress Notes (Addendum)
 Advanced Heart Failure Rounding Note  Cardiologist: None  Chief Complaint: SOB and abdominal pain  Subjective:    Continues w/ abdominal pain r/t Crohn's flare but asking for food. Currently on clear liquid diet.   Feels SOB when he lays flat. No current resting SOB w/ HOB elevated.    Objective:   Weight Range: 74.5 kg Body mass index is 22.28 kg/m.   Vital Signs:   Temp:  [97.8 F (36.6 C)-98.5 F (36.9 C)] 97.8 F (36.6 C) (05/13 0408) Pulse Rate:  [88-110] 88 (05/13 0408) Resp:  [16-32] 18 (05/13 0408) BP: (109-138)/(80-100) 112/80 (05/13 0408) SpO2:  [95 %-100 %] 100 % (05/13 0408) Weight:  [74.5 kg-77.1 kg] 74.5 kg (05/13 0408) Last BM Date : 11/30/23  Weight change: Filed Weights   11/30/23 1029 11/30/23 2123 12/01/23 0408  Weight: 77.1 kg 74.8 kg 74.5 kg    Intake/Output:   Intake/Output Summary (Last 24 hours) at 12/01/2023 0749 Last data filed at 11/30/2023 1927 Gross per 24 hour  Intake 100 ml  Output --  Net 100 ml      Physical Exam    General:  fatigued appearing. No resp difficulty HEENT: Normal Neck: Supple. JVP elevated 12 cm . Carotids 2+ bilat; no bruits. No lymphadenopathy or thyromegaly appreciated. Cor: PMI nondisplaced. Regular rate & rhythm. No rubs, gallops or murmurs. Lungs: decreased BS bilaterally  Abdomen: Soft, nontender, nondistended. No hepatosplenomegaly. No bruits or masses. Good bowel sounds. Extremities: No cyanosis, clubbing, rash, edema Neuro: Alert & orientedx3, cranial nerves grossly intact. moves all 4 extremities w/o difficulty.   Telemetry   NSR 90s, personally reviewed   EKG    N/A   Labs    CBC Recent Labs    11/30/23 1132 12/01/23 0027  WBC 10.8* 9.5  HGB 9.5* 8.3*  HCT 33.9* 28.7*  MCV 65.2* 64.6*  PLT 448* 384   Basic Metabolic Panel Recent Labs    16/10/96 1132 12/01/23 0027  NA 136 136  K 4.0 4.3  CL 105 107  CO2 23 20*  GLUCOSE 104* 102*  BUN 12 17  CREATININE 1.10 1.01   CALCIUM  8.2* 8.1*   Liver Function Tests Recent Labs    11/30/23 1132 12/01/23 0027  AST 19 14*  ALT 9 9  ALKPHOS 71 62  BILITOT 0.7 0.5  PROT 7.0 6.0*  ALBUMIN 2.8* 2.4*   No results for input(s): "LIPASE", "AMYLASE" in the last 72 hours. Cardiac Enzymes No results for input(s): "CKTOTAL", "CKMB", "CKMBINDEX", "TROPONINI" in the last 72 hours.  BNP: BNP (last 3 results) Recent Labs    10/07/23 2312 10/19/23 1242 11/30/23 1132  BNP 1,830.7* 801.6* 1,539.9*    ProBNP (last 3 results) No results for input(s): "PROBNP" in the last 8760 hours.   D-Dimer No results for input(s): "DDIMER" in the last 72 hours. Hemoglobin A1C No results for input(s): "HGBA1C" in the last 72 hours. Fasting Lipid Panel No results for input(s): "CHOL", "HDL", "LDLCALC", "TRIG", "CHOLHDL", "LDLDIRECT" in the last 72 hours. Thyroid Function Tests No results for input(s): "TSH", "T4TOTAL", "T3FREE", "THYROIDAB" in the last 72 hours.  Invalid input(s): "FREET3"  Other results:   Imaging    CT ABDOMEN PELVIS W CONTRAST Result Date: 11/30/2023 CLINICAL DATA:  Crohn's exacerbation, rectal pain EXAM: CT ABDOMEN AND PELVIS WITH CONTRAST TECHNIQUE: Multidetector CT imaging of the abdomen and pelvis was performed using the standard protocol following bolus administration of intravenous contrast. RADIATION DOSE REDUCTION: This exam was performed according  to the departmental dose-optimization program which includes automated exposure control, adjustment of the mA and/or kV according to patient size and/or use of iterative reconstruction technique. CONTRAST:  75mL OMNIPAQUE  IOHEXOL  350 MG/ML SOLN COMPARISON:  CT of the abdomen pelvis performed October 07, 2023 FINDINGS: Lower chest: Nothing significant. Hepatobiliary: No focal liver abnormality is seen. No gallstones, gallbladder wall thickening, or biliary dilatation. Pancreas: Unremarkable. No pancreatic ductal dilatation or surrounding inflammatory  changes. Spleen: Normal in size without focal abnormality. Adrenals/Urinary Tract: The adrenal glands are within normal limits. Unchanged appearance of 2 cm left lower pole renal cyst. Stomach/Bowel: The stomach is nondilated. Scattered air-fluid levels are present within the small bowel. There are segments of small bowel which are mildly dilated in diameter and fluid-filled. There is no clear transition point. Wall thickening is suspected in the left colon with mild inflammatory stranding in the adjacent fat. The rectum is decompressed and wall thickening is favored. There is a small focus of soft tissue thickening in the medial right gluteal soft tissues with a possible small focus of air annotated on image 93 of series 5. This may represent a small perianal abscess or site of soft tissue infection. Vascular/Lymphatic: Mildly prominent, but not clearly pathologically enlarged lymph nodes are present. No significant vascular abnormality is appreciated. Reproductive: Nothing significant. Other: No abdominal wall hernia or abnormality. No abdominopelvic ascites. Musculoskeletal: No acute or significant osseous findings. IMPRESSION: 1. Small focus of soft tissue thickening and air in the medial right gluteal soft tissues, possibly representing a small perianal abscess or soft tissue infection. 2. Diffuse rectal wall thickening which can be observed in the setting of inflammatory bowel disease. 3. Mild diffuse thickening of the left colon, similar when compared to CT performed October 07, 2023. 4. No evidence of peritoneal abscess. Electronically Signed   By: Reagan Camera M.D.   On: 11/30/2023 14:36   CT Angio Chest PE W and/or Wo Contrast Result Date: 11/30/2023 CLINICAL DATA:  Pulmonary embolism suspected EXAM: CT ANGIOGRAPHY CHEST WITH CONTRAST TECHNIQUE: Multidetector CT imaging of the chest was performed using the standard protocol during bolus administration of intravenous contrast. Multiplanar CT image  reconstructions and MIPs were obtained to evaluate the vascular anatomy. Multiplanar image (3D post-processing) reconstructions and MIPs were obtained to evaluate the vascular anatomy. RADIATION DOSE REDUCTION: This exam was performed according to the departmental dose-optimization program which includes automated exposure control, adjustment of the mA and/or kV according to patient size and/or use of iterative reconstruction technique. CONTRAST:  75mL OMNIPAQUE  IOHEXOL  350 MG/ML SOLN COMPARISON:  CT angiogram of the chest performed October 07, 2023 FINDINGS: Cardiovascular: Enlarged heart. Main pulmonary artery is within normal limits for size. No evidence of pulmonary embolism to the segmental level. Mediastinum/Nodes: No enlarged mediastinal, hilar, or axillary lymph nodes. Thyroid gland, trachea, and esophagus demonstrate no significant findings. Lungs/Pleura: Lungs are clear. No pleural effusion or pneumothorax. Upper Abdomen: Reported separately Musculoskeletal: No chest wall abnormality. No acute or significant osseous findings. Review of the MIP images confirms the above findings. IMPRESSION: 1. No evidence of pulmonary embolism to the segmental level. Electronically Signed   By: Reagan Camera M.D.   On: 11/30/2023 14:26     Medications:     Scheduled Medications:  aspirin  EC  81 mg Oral Daily   digoxin   0.125 mg Oral Daily   sodium chloride  flush  3 mL Intravenous Q12H    Infusions:  cefTRIAXone  (ROCEPHIN )  IV     metronidazole  500 mg (12/01/23 0527)  PRN Medications: acetaminophen  **OR** acetaminophen , HYDROmorphone  (DILAUDID ) injection, mouth rinse    Patient Profile   Shaun [name] is a 38 y.o. male with history of CAD w/ prior MI in 2017, IV drug abuse, ischemic cardiomyopathy s/p ICD, chronic systolic CHF, HTN, Crohn's disease with perianal fistula, untreated chronic HepC, hx DVT/PE 10/24.   Assessment/Plan   1. Acute on Chronic Biventricular Systolic Heart Failure: Mixed  ischemic/nonischemic CM. EF 25% in 2017 s/p STEMI. Has long history of IV drug use, had been off drugs while incarcerated, has since been released.  EF persistently low <20% in 9/24 with endocarditis, s/p ICD 10/24. Recent decompensation 3/25, EF remains <20% (had been off all GDMT). CMR with LVEF 13%, no LV thrombus, dilated RV w mod HK, LGE related to MI.  - BNP 1540. Volume up on exam. + orthopnea  - start diuresis IV Lasix  40 mg bid  - Continue digoxin  0.125 mcg daily.  - Hold beta blocker with volume overload  - No SGLT2i with perianal fistula and rash - add spiro 12.5 mg daily  - Not an advanced therapies candidate ATM d/t drug use and noncompliance. USD + for amphetamine and opiates 3/25.    2. CAD: STEMI in 2017 in setting of cocaine, heroin and tobacco use. S/p PTCA/DES to ostial/prox LAD and mid LAD, PTCA to D1. No current ischemic CP  - Continue statin + ASA   3. MR: Severe on echo. Mod-severe on CMR. With severity of HF, would likely not benefit from MitralClip.   4. Hx VT: S/p ICD placement    5. Possible LV thrombus: Could not r/o on echo 3/25, no evidence of thrombus on cMRI.  -  on Eliquis  PTA. Holding for now given recent rectal bleeding     6. Polysubstance abuse: H/o IV drug use. Cocaine, Methamphetamine, Heroin, Tobacco. - UDS 3/25 + for opiates, amphetamines, and THC - USD this admit + for THC    7. Rectal bleeding - r/t perianal fistula and fissures - on abx per primary. - hgb stable, 8.3  - GI and GS following     8. Concern for sepsis - code sepsis called in ED - blood cultures pending  9. Crohn's Flare - on ceftriaxone  and metronidazole  - GI following  - clear liquid diet   Length of Stay: 0  Shaun Simmons, PA-C  12/01/2023, 7:49 AM  Advanced Heart Failure Team Pager [phone] (M-F; 7a - 5p)  Please contact [facility] for night-coverage after hours (5p -7a ) and weekends on [email or web address]  Patient seen with PA, I formulated the plan and agree  with the above note.   He has ongoing abdominal pain, now on clear liquid diet.  He reports frequent urination on IV Lasix .  Creatinine stable.   General: NAD Neck: JVP 12-14 cm, no thyromegaly or thyroid nodule.  Lungs: Clear to auscultation bilaterally with normal respiratory effort. CV: Lateral PMI.  Heart regular S1/S2, no S3/S4, 3/6 HSM apex.  No peripheral edema.    Abdomen: Soft, mild diffuse tenderness, no hepatosplenomegaly, no distention.  Skin: Intact without lesions or rashes.  Neurologic: Alert and oriented x 3.  Psych: Normal affect. Extremities: No clubbing or cyanosis.  HEENT: Normal.   1. 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  Has ICD.  Echo in 3/25 with EF < 20%, severe LV dilation, mildly decreased RV systolic function, severe MR.  Cardiac MRI (3/25) with LV EF 13%, moderate RV dysfunction, moderate-severe MR,  coronary-type LGE in the mid-apical anterior and anteroseptal walls suggestive of prior MI. I suspect a mixed ischemic/nonischemic cardiomyopathy as the patient has global severe hypokinesis. He was only taking Coreg  at home, was out of diuretics. He did not followup in CHF clinic.  He is significantly volume overloaded.  Good diuresis so far with IV Lasix .  - Lasix  40 mg IV bid.  - Digoxin  0.125 daily.  - Hold beta blocker with volume overload, ?low output.  - Add spironolactone  12.5 daily.  - No SGLT2 inhibitor with peri-anal fistula.  Viola Greulich tomorrow if BP remains stable.  - Not candidate for advanced therapies with active substance abuse, social situation, and noncompliance.  2. CAD: STEMI in 2017 in setting of cocaine, heroin and tobacco use. S/p DES to ostial/prox LAD and mid LAD, PTCA to D1.  No chest pain.  - Continue statin + ASA 81.  3. Mitral regurgitation: Possibly severe.  Functional MR.  - Can consider Mitraclip in the future if he can be compliant with medical therapy.  4.  Possible LV thrombus: Not seen on 3/25 cMRI.  - Eliquis  on hold with rectal bleeding.  5. Polysubstance abuse: h/o cocaine, methamphetamines.  UDS only positive of THC this admission.  6. Crohn's disease: Flare this admission, still with abdominal pain.  Has peri-anal fistula.  - GI following.  - Clear liquids - On ceftriaxone /Flagyl .   Peder Bourdon 12/01/2023 10:26 AM

## 2023-12-01 NOTE — Plan of Care (Signed)

## 2023-12-01 NOTE — Progress Notes (Signed)
 Progress Note   Subjective  Hospital day #2 Chief Complaint: Crohn's flare with rectal pain and fistulas  Per review of chart patient has been seen and consulted by general surgery who have now signed off.  They do not believe patient has a true rectal abscess.  Today, patient tells me that he is actually doing much better surprisingly.  Reports a solid stool this morning which would have been his first in weeks per his prior report, with only a small amount of bright red blood.  He continues with rectal pain which is the largest complaint today still rated as a 9/10.  He is really wanting to eat something.  Has been on clears overnight.   Objective   Vital signs in last 24 hours: Temp:  [97.8 F (36.6 C)-98.3 F (36.8 C)] 98.1 F (36.7 C) (05/13 0821) Pulse Rate:  [88-110] 90 (05/13 0821) Resp:  [16-32] 18 (05/13 0821) BP: (109-138)/(79-99) 112/79 (05/13 0821) SpO2:  [95 %-100 %] 100 % (05/13 0408) Weight:  [74.5 kg-74.8 kg] 74.5 kg (05/13 0408) Last BM Date : 11/30/23 General:    white male in NAD Heart:  Regular rate and rhythm; no murmurs Lungs: Respirations even and unlabored, lungs CTA bilaterally Abdomen:  Soft, moderate generalized TTP and nondistended. Normal bowel sounds. Psych:  Cooperative. Normal mood and affect.  Intake/Output from previous day: 05/12 0701 - 05/13 0700 In: 100 [IV Piggyback:100] Out: -    Lab Results: Recent Labs    11/30/23 1132 12/01/23 0027  WBC 10.8* 9.5  HGB 9.5* 8.3*  HCT 33.9* 28.7*  PLT 448* 384   BMET Recent Labs    11/30/23 1132 12/01/23 0027  NA 136 136  K 4.0 4.3  CL 105 107  CO2 23 20*  GLUCOSE 104* 102*  BUN 12 17  CREATININE 1.10 1.01  CALCIUM  8.2* 8.1*   LFT Recent Labs    12/01/23 0027  PROT 6.0*  ALBUMIN 2.4*  AST 14*  ALT 9  ALKPHOS 62  BILITOT 0.5   PT/INR Recent Labs    12/01/23 0027  LABPROT 16.2*  INR 1.3*    Studies/Results: CT ABDOMEN PELVIS W CONTRAST Result Date:  11/30/2023 CLINICAL DATA:  Crohn's exacerbation, rectal pain EXAM: CT ABDOMEN AND PELVIS WITH CONTRAST TECHNIQUE: Multidetector CT imaging of the abdomen and pelvis was performed using the standard protocol following bolus administration of intravenous contrast. RADIATION DOSE REDUCTION: This exam was performed according to the departmental dose-optimization program which includes automated exposure control, adjustment of the mA and/or kV according to patient size and/or use of iterative reconstruction technique. CONTRAST:  75mL OMNIPAQUE  IOHEXOL  350 MG/ML SOLN COMPARISON:  CT of the abdomen pelvis performed October 07, 2023 FINDINGS: Lower chest: Nothing significant. Hepatobiliary: No focal liver abnormality is seen. No gallstones, gallbladder wall thickening, or biliary dilatation. Pancreas: Unremarkable. No pancreatic ductal dilatation or surrounding inflammatory changes. Spleen: Normal in size without focal abnormality. Adrenals/Urinary Tract: The adrenal glands are within normal limits. Unchanged appearance of 2 cm left lower pole renal cyst. Stomach/Bowel: The stomach is nondilated. Scattered air-fluid levels are present within the small bowel. There are segments of small bowel which are mildly dilated in diameter and fluid-filled. There is no clear transition point. Wall thickening is suspected in the left colon with mild inflammatory stranding in the adjacent fat. The rectum is decompressed and wall thickening is favored. There is a small focus of soft tissue thickening in the medial right gluteal soft tissues with a possible  small focus of air annotated on image 93 of series 5. This may represent a small perianal abscess or site of soft tissue infection. Vascular/Lymphatic: Mildly prominent, but not clearly pathologically enlarged lymph nodes are present. No significant vascular abnormality is appreciated. Reproductive: Nothing significant. Other: No abdominal wall hernia or abnormality. No abdominopelvic  ascites. Musculoskeletal: No acute or significant osseous findings. IMPRESSION: 1. Small focus of soft tissue thickening and air in the medial right gluteal soft tissues, possibly representing a small perianal abscess or soft tissue infection. 2. Diffuse rectal wall thickening which can be observed in the setting of inflammatory bowel disease. 3. Mild diffuse thickening of the left colon, similar when compared to CT performed October 07, 2023. 4. No evidence of peritoneal abscess. Electronically Signed   By: Reagan Camera M.D.   On: 11/30/2023 14:36   CT Angio Chest PE W and/or Wo Contrast Result Date: 11/30/2023 CLINICAL DATA:  Pulmonary embolism suspected EXAM: CT ANGIOGRAPHY CHEST WITH CONTRAST TECHNIQUE: Multidetector CT imaging of the chest was performed using the standard protocol during bolus administration of intravenous contrast. Multiplanar CT image reconstructions and MIPs were obtained to evaluate the vascular anatomy. Multiplanar image (3D post-processing) reconstructions and MIPs were obtained to evaluate the vascular anatomy. RADIATION DOSE REDUCTION: This exam was performed according to the departmental dose-optimization program which includes automated exposure control, adjustment of the mA and/or kV according to patient size and/or use of iterative reconstruction technique. CONTRAST:  75mL OMNIPAQUE  IOHEXOL  350 MG/ML SOLN COMPARISON:  CT angiogram of the chest performed October 07, 2023 FINDINGS: Cardiovascular: Enlarged heart. Main pulmonary artery is within normal limits for size. No evidence of pulmonary embolism to the segmental level. Mediastinum/Nodes: No enlarged mediastinal, hilar, or axillary lymph nodes. Thyroid gland, trachea, and esophagus demonstrate no significant findings. Lungs/Pleura: Lungs are clear. No pleural effusion or pneumothorax. Upper Abdomen: Reported separately Musculoskeletal: No chest wall abnormality. No acute or significant osseous findings. Review of the MIP images  confirms the above findings. IMPRESSION: 1. No evidence of pulmonary embolism to the segmental level. Electronically Signed   By: Reagan Camera M.D.   On: 11/30/2023 14:26    Assessment / Plan:   Assessment: 1.  Crohn's disease with flare: Patient reports colonoscopy 2 years ago done in prison with diagnosis of Crohn's and severe damage throughout his colon, we do not have report, apparently maintained on Prednisone tapers over the past couple of years off-and-on, never on maintenance therapy, symptoms worsening since March with liquid stool hematochezia as well as rectal pain, fistula and possible abscess, patient has had improvement in symptoms overnight 2.  Suspected perirectal cellulitis with possible abscess: Evaluated by general surgery, they have already signed off as they do not think this is a rectal abscess 3.  Chronic biventricular systolic heart failure: Mixed ischemic/nonischemic cardiomyopathy with an EF of less than 20%, status post ICD 04/2023 4.  CAD: STEMI in 2017 in the setting of cocaine, heroin and tobacco, currently not on anticoagulation 5.  Severe mitral regurg 6.  HCV antibody reactive  Plan: 1.  QuantiFERON-TB gold pending, iron  studies with an iron  low at 17, percent saturation low at 4, ferritin low at 6, hep C antibody reactive-ordered hep C antibody PCR testing today, CRP and ESR surprisingly normal 2.  Appreciate surgical team's consultation, will call them back if things worsen 3.  Continue antibiotics 4.  Increased patient to a low fiber diet today as tolerated 5.  Continue analgesics  Thank you for your kind consultation,  we will continue to follow.   LOS: 0 days   Graciella Lavender  12/01/2023, 12:52 PM

## 2023-12-01 NOTE — TOC Initial Note (Signed)
 Transition of Care [facility]) - Initial/Assessment Note    Patient Details  Name: Shaun [name] MRN: [id number] Date of Birth: March 29, 1986  Transition of Care [facility]) CM/SW Contact:    Ernst Heap Phone Number: [phone] 12/01/2023, 3:25 PM  Clinical Narrative:  HF CSW met with patient at bedside. Patient stated that he lives with his brother. Patient stated  that he has no history of HH services. Patient stated that he does not use any equipment. Patient stated that the PCP on file is not the correct one and will need a new PCP closer towards dc.   TOC will continue following            Expected Discharge Plan: Home/Self Care Barriers to Discharge: Continued Medical Work up   Patient Goals and CMS Choice            Expected Discharge Plan and Services       Living arrangements for the past 2 months: Single Family Home                                      Prior Living Arrangements/Services Living arrangements for the past 2 months: Single Family Home Lives with:: Siblings Patient language and need for interpreter reviewed:: Yes Do you feel safe going back to the place where you live?: Yes      Need for Family Participation in Patient Care: No (Comment) Care giver support system in place?: Yes (comment)   Criminal Activity/Legal Involvement Pertinent to Current Situation/Hospitalization: No - Comment as needed  Activities of Daily Living   ADL Screening (condition at time of admission) Independently performs ADLs?: Yes (appropriate for developmental age) Is the patient deaf or have difficulty hearing?: No Does the patient have difficulty seeing, even when wearing glasses/contacts?: No Does the patient have difficulty concentrating, remembering, or making decisions?: No  Permission Sought/Granted                  Emotional Assessment Appearance:: Appears stated age Attitude/Demeanor/Rapport: Engaged Affect (typically observed):  Appropriate Orientation: : Oriented to Self, Oriented to Place, Oriented to  Time, Oriented to Situation Alcohol / Substance Use: Not Applicable Psych Involvement: No (comment)  Admission diagnosis:  Rectal pain [K62.89] Perirectal cellulitis [K61.1] Bright red rectal bleeding [K62.5] Acute GI bleeding [K92.2] Acute on chronic congestive heart failure, unspecified heart failure type (HCC) [I50.9] Left lower quadrant abdominal pain [R10.32] Sepsis, due to unspecified organism, unspecified whether acute organ dysfunction present (HCC) [A41.9] Crohn's disease of colon (HCC) [K50.10] Patient Active Problem List   Diagnosis Date Noted   Crohn's disease of colon (HCC) 12/01/2023   Acute GI bleeding 11/30/2023   Crohn's colitis (HCC) 11/30/2023   Perirectal cellulitis 11/30/2023   Rectal pain 11/30/2023   Crohn's disease of perianal region with abscess (HCC) 11/30/2023   Non-ST elevation (NSTEMI) myocardial infarction (HCC) 10/10/2023   Hx of ventricular tachycardia 05/18/2023   History of substance use disorder 05/11/2023   AICD (automatic cardioverter/defibrillator) present 05/07/2023   Crohn's disease (HCC) 07/25/2022   History of intravenous drug abuse    Lung field abnormal finding on examination    Coronary artery disease involving native coronary artery of native heart without angina pectoris 12/30/2019   Essential hypertension 12/30/2019   Ischemic cardiomyopathy 12/30/2019   Mixed hyperlipidemia 12/30/2019   History of DVT (deep vein thrombosis) 12/30/2019   Nicotine  dependence, cigarettes, uncomplicated 12/30/2019  Intravenous drug abuse (HCC) 12/30/2019   Chronic systolic CHF (congestive heart failure) (HCC) 12/30/2019   Severe major depression without psychotic features (HCC) 08/30/2018   Chronic hepatitis C virus infection (HCC) 04/29/2016   Substance abuse (HCC) 04/29/2016   PCP:  Otis Blocker, PA Pharmacy:   [facility] [phone] - [place], [place] - [address] AT [facility] [address] [place] [address] Phone: [phone] Fax: [phone]  [facility] [address] [place] [address] Phone: [phone] Fax: [phone]     Social Drivers of Health (SDOH) Social History: SDOH Screenings   Food Insecurity: No Food Insecurity (11/30/2023)  Housing: Low Risk  (11/30/2023)  Transportation Needs: No Transportation Needs (11/30/2023)  Utilities: Not At Risk (11/30/2023)  Financial Resource Strain: High Risk (05/19/2023)   Received from [facility]  Social Connections: Unknown (07/02/2022)   Received from [facility]  Tobacco Use: High Risk (11/30/2023)   SDOH Interventions:     Readmission Risk Interventions     No data to display

## 2023-12-01 NOTE — Progress Notes (Signed)
 Progress Note     Subjective: States drainage from buttocks is about the same and pain is better. He declines exam   Objective: Vital signs in last 24 hours: Temp:  [97.8 F (36.6 C)-98.5 F (36.9 C)] 98.1 F (36.7 C) (05/13 0821) Pulse Rate:  [88-110] 90 (05/13 0821) Resp:  [16-32] 18 (05/13 0821) BP: (109-138)/(79-100) 112/79 (05/13 0821) SpO2:  [95 %-100 %] 100 % (05/13 0408) Weight:  [74.5 kg-77.1 kg] 74.5 kg (05/13 0408) Last BM Date : 11/30/23  Intake/Output from previous day: 05/12 0701 - 05/13 0700 In: 100 [IV Piggyback:100] Out: -  Intake/Output this shift: No intake/output data recorded.  PE: General: pleasant, WD, male who is sitting up in bed in NAD HEENT: head is normocephalic, atraumatic.  Sclera are noninjected.  Pupils equal and round. EOMs intact.  Ears and nose without any masses or lesions.  Mouth is pink and moist Lungs: Respiratory effort nonlabored GU: declines exam Skin: warm and dry Psych: A&Ox3 with an appropriate affect.    Lab Results:  Recent Labs    11/30/23 1132 12/01/23 0027  WBC 10.8* 9.5  HGB 9.5* 8.3*  HCT 33.9* 28.7*  PLT 448* 384   BMET Recent Labs    11/30/23 1132 12/01/23 0027  NA 136 136  K 4.0 4.3  CL 105 107  CO2 23 20*  GLUCOSE 104* 102*  BUN 12 17  CREATININE 1.10 1.01  CALCIUM  8.2* 8.1*   PT/INR Recent Labs    12/01/23 0027  LABPROT 16.2*  INR 1.3*   CMP     Component Value Date/Time   NA 136 12/01/2023 0027   K 4.3 12/01/2023 0027   CL 107 12/01/2023 0027   CO2 20 (L) 12/01/2023 0027   GLUCOSE 102 (H) 12/01/2023 0027   BUN 17 12/01/2023 0027   CREATININE 1.01 12/01/2023 0027   CALCIUM  8.1 (L) 12/01/2023 0027   PROT 6.0 (L) 12/01/2023 0027   ALBUMIN 2.4 (L) 12/01/2023 0027   AST 14 (L) 12/01/2023 0027   ALT 9 12/01/2023 0027   ALKPHOS 62 12/01/2023 0027   BILITOT 0.5 12/01/2023 0027   GFRNONAA >60 12/01/2023 0027   GFRAA >60 03/03/2020 2246   Lipase  No results found for:  "LIPASE"     Studies/Results: CT ABDOMEN PELVIS W CONTRAST Result Date: 11/30/2023 CLINICAL DATA:  Crohn's exacerbation, rectal pain EXAM: CT ABDOMEN AND PELVIS WITH CONTRAST TECHNIQUE: Multidetector CT imaging of the abdomen and pelvis was performed using the standard protocol following bolus administration of intravenous contrast. RADIATION DOSE REDUCTION: This exam was performed according to the departmental dose-optimization program which includes automated exposure control, adjustment of the mA and/or kV according to patient size and/or use of iterative reconstruction technique. CONTRAST:  75mL OMNIPAQUE  IOHEXOL  350 MG/ML SOLN COMPARISON:  CT of the abdomen pelvis performed October 07, 2023 FINDINGS: Lower chest: Nothing significant. Hepatobiliary: No focal liver abnormality is seen. No gallstones, gallbladder wall thickening, or biliary dilatation. Pancreas: Unremarkable. No pancreatic ductal dilatation or surrounding inflammatory changes. Spleen: Normal in size without focal abnormality. Adrenals/Urinary Tract: The adrenal glands are within normal limits. Unchanged appearance of 2 cm left lower pole renal cyst. Stomach/Bowel: The stomach is nondilated. Scattered air-fluid levels are present within the small bowel. There are segments of small bowel which are mildly dilated in diameter and fluid-filled. There is no clear transition point. Wall thickening is suspected in the left colon with mild inflammatory stranding in the adjacent fat. The rectum is decompressed and wall  thickening is favored. There is a small focus of soft tissue thickening in the medial right gluteal soft tissues with a possible small focus of air annotated on image 93 of series 5. This may represent a small perianal abscess or site of soft tissue infection. Vascular/Lymphatic: Mildly prominent, but not clearly pathologically enlarged lymph nodes are present. No significant vascular abnormality is appreciated. Reproductive: Nothing  significant. Other: No abdominal wall hernia or abnormality. No abdominopelvic ascites. Musculoskeletal: No acute or significant osseous findings. IMPRESSION: 1. Small focus of soft tissue thickening and air in the medial right gluteal soft tissues, possibly representing a small perianal abscess or soft tissue infection. 2. Diffuse rectal wall thickening which can be observed in the setting of inflammatory bowel disease. 3. Mild diffuse thickening of the left colon, similar when compared to CT performed October 07, 2023. 4. No evidence of peritoneal abscess. Electronically Signed   By: Reagan Camera M.D.   On: 11/30/2023 14:36   CT Angio Chest PE W and/or Wo Contrast Result Date: 11/30/2023 CLINICAL DATA:  Pulmonary embolism suspected EXAM: CT ANGIOGRAPHY CHEST WITH CONTRAST TECHNIQUE: Multidetector CT imaging of the chest was performed using the standard protocol during bolus administration of intravenous contrast. Multiplanar CT image reconstructions and MIPs were obtained to evaluate the vascular anatomy. Multiplanar image (3D post-processing) reconstructions and MIPs were obtained to evaluate the vascular anatomy. RADIATION DOSE REDUCTION: This exam was performed according to the departmental dose-optimization program which includes automated exposure control, adjustment of the mA and/or kV according to patient size and/or use of iterative reconstruction technique. CONTRAST:  75mL OMNIPAQUE  IOHEXOL  350 MG/ML SOLN COMPARISON:  CT angiogram of the chest performed October 07, 2023 FINDINGS: Cardiovascular: Enlarged heart. Main pulmonary artery is within normal limits for size. No evidence of pulmonary embolism to the segmental level. Mediastinum/Nodes: No enlarged mediastinal, hilar, or axillary lymph nodes. Thyroid gland, trachea, and esophagus demonstrate no significant findings. Lungs/Pleura: Lungs are clear. No pleural effusion or pneumothorax. Upper Abdomen: Reported separately Musculoskeletal: No chest wall  abnormality. No acute or significant osseous findings. Review of the MIP images confirms the above findings. IMPRESSION: 1. No evidence of pulmonary embolism to the segmental level. Electronically Signed   By: Reagan Camera M.D.   On: 11/30/2023 14:26    Anti-infectives: Anti-infectives (From admission, onward)    Start     Dose/Rate Route Frequency Ordered Stop   12/01/23 1000  cefTRIAXone  (ROCEPHIN ) 2 g in sodium chloride  0.9 % 100 mL IVPB        2 g 200 mL/hr over 30 Minutes Intravenous Every 24 hours 11/30/23 1539     11/30/23 2200  metroNIDAZOLE  (FLAGYL ) IVPB 500 mg  Status:  Discontinued        500 mg 100 mL/hr over 60 Minutes Intravenous Every 12 hours 11/30/23 1539 11/30/23 1540   11/30/23 1500  cefTRIAXone  (ROCEPHIN ) 1 g in sodium chloride  0.9 % 100 mL IVPB        1 g 200 mL/hr over 30 Minutes Intravenous  Once 11/30/23 1449 11/30/23 1625   11/30/23 1500  metroNIDAZOLE  (FLAGYL ) IVPB 500 mg        500 mg 100 mL/hr over 60 Minutes Intravenous Every 12 hours 11/30/23 1449          Assessment/Plan N/V/abd pain Crohn's disease H/o perirectal abscesses now with chronic fistulas  No acute perirectal abscesses on exam yesterday with 2 fistulas tracts and no infection. Patient declines exam today but denies any changes from yesterday  We  will sign off but please do not hesitate to contact us  with any questions or concerns or reconsult if needed.   I reviewed Consultant GI notes, hospitalist notes, last 24 h vitals and pain scores, last 48 h intake and output, last 24 h labs and trends, and last 24 h imaging results.     LOS: 0 days   Elwin Hammond, [facility] 12/01/2023, 10:27 AM Please see Amion for pager number during day hours 7:00am-4:30pm

## 2023-12-02 ENCOUNTER — Encounter: Payer: Self-pay | Admitting: Gastroenterology

## 2023-12-02 DIAGNOSIS — K50113 Crohn's disease of large intestine with fistula: Secondary | ICD-10-CM | POA: Diagnosis not present

## 2023-12-02 DIAGNOSIS — D509 Iron deficiency anemia, unspecified: Secondary | ICD-10-CM

## 2023-12-02 DIAGNOSIS — R768 Other specified abnormal immunological findings in serum: Secondary | ICD-10-CM | POA: Diagnosis not present

## 2023-12-02 DIAGNOSIS — K625 Hemorrhage of anus and rectum: Secondary | ICD-10-CM

## 2023-12-02 LAB — QUANTIFERON-TB GOLD PLUS (RQFGPL)
QuantiFERON Mitogen Value: >10 [IU]/mL
QuantiFERON Nil Value: 0.03 [IU]/mL
QuantiFERON TB1 Ag Value: 0.03 [IU]/mL
QuantiFERON TB2 Ag Value: 0.03 [IU]/mL

## 2023-12-02 LAB — HCV RT-PCR, QUANT (NON-GRAPH)
HCV log10: 4.292 {Log_IU}/mL
Hepatitis C Quantitation: 19600 [IU]/mL

## 2023-12-02 LAB — BASIC METABOLIC PANEL WITH GFR
Anion gap: 5 (ref 5–15)
BUN: 17 mg/dL (ref 6–20)
CO2: 27 mmol/L (ref 22–32)
Calcium: 7.9 mg/dL — ABNORMAL LOW (ref 8.9–10.3)
Chloride: 104 mmol/L (ref 98–111)
Creatinine, Ser: 1.21 mg/dL (ref 0.61–1.24)
GFR, Estimated: >60 mL/min (ref >60)
Glucose, Bld: 99 mg/dL (ref 70–99)
Potassium: 4.4 mmol/L (ref 3.5–5.1)
Sodium: 136 mmol/L (ref 135–145)

## 2023-12-02 LAB — C DIFFICILE QUICK SCREEN W PCR REFLEX
C Diff antigen: NEGATIVE
C Diff interpretation: NOT DETECTED
C Diff toxin: NEGATIVE

## 2023-12-02 LAB — QUANTIFERON-TB GOLD PLUS: QuantiFERON-TB Gold Plus: NEGATIVE

## 2023-12-02 LAB — MAGNESIUM: Magnesium: 1.9 mg/dL (ref 1.7–2.4)

## 2023-12-02 LAB — HCV AB W REFLEX TO QUANT PCR: HCV Ab: REACTIVE — AB

## 2023-12-02 MED ORDER — METRONIDAZOLE 500 MG PO TABS
500.0000 mg | ORAL_TABLET | Freq: Two times a day (BID) | ORAL | Status: DC
  Administered 2023-12-02 – 2023-12-03 (×2): 500 mg via ORAL
  Filled 2023-12-02 (×2): qty 1

## 2023-12-02 MED ORDER — OXYCODONE HCL 5 MG PO TABS: 5.0000 mg | ORAL_TABLET | Freq: Four times a day (QID) | ORAL | Status: DC | PRN

## 2023-12-02 MED ORDER — OXYCODONE HCL 5 MG PO TABS
5.0000 mg | ORAL_TABLET | Freq: Four times a day (QID) | ORAL | Status: AC | PRN
  Administered 2023-12-02 (×2): 5 mg via ORAL
  Filled 2023-12-02 (×2): qty 1

## 2023-12-02 MED ORDER — MAGNESIUM SULFATE 2 GM/50ML IV SOLN
2.0000 g | Freq: Once | INTRAVENOUS | Status: AC
  Administered 2023-12-02: 2 g via INTRAVENOUS
  Filled 2023-12-02: qty 50

## 2023-12-02 MED ORDER — OXYCODONE-ACETAMINOPHEN 5-325 MG PO TABS
1.0000 | ORAL_TABLET | Freq: Four times a day (QID) | ORAL | Status: DC | PRN
  Administered 2023-12-02 – 2023-12-03 (×4): 2 via ORAL
  Filled 2023-12-02 (×4): qty 2

## 2023-12-02 MED ORDER — PREDNISONE 20 MG PO TABS
40.0000 mg | ORAL_TABLET | Freq: Every day | ORAL | Status: DC
Start: 2023-12-02 — End: 2023-12-03
  Administered 2023-12-02 – 2023-12-03 (×2): 40 mg via ORAL
  Filled 2023-12-02 (×2): qty 2

## 2023-12-02 MED ORDER — NALOXONE HCL 0.4 MG/ML IJ SOLN: 0.4000 mg | INTRAMUSCULAR | Status: DC | PRN

## 2023-12-02 MED ORDER — IRON SUCROSE 200 MG IVPB - SIMPLE MED
200.0000 mg | Freq: Once | Status: AC
  Administered 2023-12-02: 200 mg via INTRAVENOUS
  Filled 2023-12-02: qty 200

## 2023-12-02 MED ORDER — NICOTINE 21 MG/24HR TD PT24
21.0000 mg | MEDICATED_PATCH | Freq: Every day | TRANSDERMAL | Status: DC
  Administered 2023-12-02 – 2023-12-03 (×2): 21 mg via TRANSDERMAL
  Filled 2023-12-02 (×2): qty 1

## 2023-12-02 NOTE — Progress Notes (Signed)
 Patient states that Dilaudid  is not controlling his pain for more than an hour at a time.  Stated that on previous admits he has had oxycodone  10 mg every six hours with adequate pain control.  Dr Michell Ahumada made aware, new orders received and implemented.

## 2023-12-02 NOTE — Progress Notes (Signed)
 Daily Progress Note  DOA: 11/30/2023 Hospital Day: 3   Chief Complaint: Fistulizing Crohn's with flare  ASSESSMENT    38 y.o. year old male with   Fistulizing  (perianal) Crohn's disease, untreated Surgery evaluated - no perirectal abscess . Surgery has signed off. Feeling better on Flagyl . Wants to transition from IV to oral pain meds.   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  Acute on chronic systolic heart failure / cardiomyopathy in setting of polysubstance abuse.   Cardiology following. Started on [employer]  Principal Problem:   Acute GI bleeding Active Problems:   Coronary artery disease involving native coronary artery of native heart without angina pectoris   Essential hypertension   Mixed hyperlipidemia   History of DVT (deep vein thrombosis)   Chronic systolic CHF (congestive heart failure) (HCC)   AICD (automatic cardioverter/defibrillator) present   Hepatitis C virus infection without hepatic coma   Crohn's disease (HCC)   Hx of ventricular tachycardia   Severe major depression without psychotic features (HCC)   Substance abuse (HCC)   Crohn's colitis (HCC)   Perirectal cellulitis   Rectal pain   Crohn's disease of perianal region with abscess (HCC)   Crohn's disease of colon (HCC)   Perianal fistula   PLAN   --Continue Flagyl  upon discharge - 250 mg TID x 1 month --Holding off on steroids for now --Needs referral to ID for HCV though PCR is pending and in some cases patients do clear virus spontaneously but Ab will remain positive.   --He will eventually need Biologics but need to address HCV first.  --Quantiferon Gold pending. HBV surface Ag negative.  --Fecal calprotectin and GI panel not collected. Will ask RN to collect --Our office will contact him for a 4-6 week follow up appt. stressed the importance of  compliance with medications/appointments.  Patient tells me he lives with his brother and he has reliable transportation.  Illicit drug use is an issue  Subjective   Rectal pain has significantly improved.  Tolerating diet. He would like to change from IV to oral pain meds   Objective    Recent Labs    11/30/23 1132 12/01/23 0027  WBC 10.8* 9.5  HGB 9.5* 8.3*  HCT 33.9* 28.7*  MCV 65.2* 64.6*  PLT 448* 384   Recent Labs    11/30/23 1740  FERRITIN 6*  TIBC 427  IRONPCTSAT 4*   Recent Labs    11/30/23 1132 12/01/23 0027 12/02/23 0434  NA 136 136 136  K 4.0 4.3 4.4  CL 105 107 104  CO2 23 20* 27  GLUCOSE 104* 102* 99  BUN 12 17 17   CREATININE 1.10 1.01 1.21  CALCIUM  8.2* 8.1* 7.9*   Recent Labs    11/30/23 1132 12/01/23 0027  PROT 7.0 6.0*  ALBUMIN 2.8* 2.4*  AST 19 14*  ALT 9 9  ALKPHOS 71 62  BILITOT 0.7 0.5   Recent Labs    12/01/23 0027  INR 1.3*      Imaging:  CT ABDOMEN PELVIS W CONTRAST CLINICAL DATA:  Crohn's exacerbation, rectal pain  EXAM: CT ABDOMEN AND PELVIS WITH CONTRAST  TECHNIQUE: Multidetector CT imaging of the abdomen and pelvis was performed using the standard protocol following bolus administration of intravenous contrast.  RADIATION DOSE REDUCTION: This exam was performed according to the departmental dose-optimization  program which includes automated exposure control, adjustment of the mA and/or kV according to patient size and/or use of iterative reconstruction technique.  CONTRAST:  75mL OMNIPAQUE  IOHEXOL  350 MG/ML SOLN  COMPARISON:  CT of the abdomen pelvis performed October 07, 2023  FINDINGS: Lower chest: Nothing significant.  Hepatobiliary: No focal liver abnormality is seen. No gallstones, gallbladder wall thickening, or biliary dilatation.  Pancreas: Unremarkable. No pancreatic ductal dilatation or surrounding inflammatory changes.  Spleen: Normal in size without focal abnormality.  Adrenals/Urinary  Tract: The adrenal glands are within normal limits. Unchanged appearance of 2 cm left lower pole renal cyst.  Stomach/Bowel: The stomach is nondilated. Scattered air-fluid levels are present within the small bowel. There are segments of small bowel which are mildly dilated in diameter and fluid-filled. There is no clear transition point. Wall thickening is suspected in the left colon with mild inflammatory stranding in the adjacent fat. The rectum is decompressed and wall thickening is favored.  There is a small focus of soft tissue thickening in the medial right gluteal soft tissues with a possible small focus of air annotated on image 93 of series 5. This may represent a small perianal abscess or site of soft tissue infection.  Vascular/Lymphatic: Mildly prominent, but not clearly pathologically enlarged lymph nodes are present. No significant vascular abnormality is appreciated.  Reproductive: Nothing significant.  Other: No abdominal wall hernia or abnormality. No abdominopelvic ascites.  Musculoskeletal: No acute or significant osseous findings.  IMPRESSION: 1. Small focus of soft tissue thickening and air in the medial right gluteal soft tissues, possibly representing a small perianal abscess or soft tissue infection. 2. Diffuse rectal wall thickening which can be observed in the setting of inflammatory bowel disease. 3. Mild diffuse thickening of the left colon, similar when compared to CT performed October 07, 2023. 4. No evidence of peritoneal abscess.  Electronically Signed   By: Reagan Camera M.D.   On: 11/30/2023 14:36 CT Angio Chest PE W and/or Wo Contrast CLINICAL DATA:  Pulmonary embolism suspected  EXAM: CT ANGIOGRAPHY CHEST WITH CONTRAST  TECHNIQUE: Multidetector CT imaging of the chest was performed using the standard protocol during bolus administration of intravenous contrast. Multiplanar CT image reconstructions and MIPs were obtained to evaluate the  vascular anatomy. Multiplanar image (3D post-processing) reconstructions and MIPs were obtained to evaluate the vascular anatomy.  RADIATION DOSE REDUCTION: This exam was performed according to the departmental dose-optimization program which includes automated exposure control, adjustment of the mA and/or kV according to patient size and/or use of iterative reconstruction technique.  CONTRAST:  75mL OMNIPAQUE  IOHEXOL  350 MG/ML SOLN  COMPARISON:  CT angiogram of the chest performed October 07, 2023  FINDINGS: Cardiovascular: Enlarged heart. Main pulmonary artery is within normal limits for size.  No evidence of pulmonary embolism to the segmental level.  Mediastinum/Nodes: No enlarged mediastinal, hilar, or axillary lymph nodes. Thyroid gland, trachea, and esophagus demonstrate no significant findings.  Lungs/Pleura: Lungs are clear. No pleural effusion or pneumothorax.  Upper Abdomen: Reported separately  Musculoskeletal: No chest wall abnormality. No acute or significant osseous findings.  Review of the MIP images confirms the above findings.  IMPRESSION: 1. No evidence of pulmonary embolism to the segmental level.  Electronically Signed   By: Reagan Camera M.D.   On: 11/30/2023 14:26     Scheduled inpatient medications:   aspirin  EC  81 mg Oral Daily   atorvastatin   40 mg Oral Daily   digoxin   0.125 mg Oral Daily   sodium  chloride flush  3 mL Intravenous Q12H   spironolactone   12.5 mg Oral Daily   Continuous inpatient infusions:   cefTRIAXone  (ROCEPHIN )  IV 2 g (12/02/23 0806)   metronidazole  500 mg (12/02/23 0550)   PRN inpatient medications: acetaminophen  **OR** acetaminophen , HYDROmorphone  (DILAUDID ) injection, naLOXone (NARCAN)  injection, mouth rinse  Vital signs in last 24 hours: Temp:  [98 F (36.7 C)-98.9 F (37.2 C)] 98.4 F (36.9 C) (05/14 0738) Pulse Rate:  [84-96] 84 (05/14 0454) Resp:  [18-20] 18 (05/14 0738) BP: (101-125)/(71-89) 125/89  (05/14 0738) SpO2:  [98 %-99 %] 99 % (05/14 0738) Weight:  [68.4 kg] 68.4 kg (05/14 0454) Last BM Date : 11/30/23  Intake/Output Summary (Last 24 hours) at 12/02/2023 1109 Last data filed at 12/02/2023 0854 Gross per 24 hour  Intake 480 ml  Output --  Net 480 ml    Intake/Output from previous day: 05/13 0701 - 05/14 0700 In: 240 [P.O.:240] Out: -  Intake/Output this shift: Total I/O In: 240 [P.O.:240] Out: -    Physical Exam:  General: Alert male in NAD Heart:  Regular rate and rhythm.  Pulmonary: Normal respiratory effort Abdomen: Soft, nondistended, nontender. Normal bowel sounds. Extremities: No lower extremity edema  Neurologic: Alert and oriented Psych: Pleasant. Cooperative     LOS: 1 day   Mai Schwalbe ,NP 12/02/2023, 11:09 AM

## 2023-12-02 NOTE — Progress Notes (Addendum)
 Advanced Heart Failure Rounding Note  Cardiologist: None  Chief Complaint: SOB and abdominal pain  Subjective:    Strict I/Os not recorded yesterday but he reports robust UOP. Wt down 14 lb. Volume improved on exam. Breathing improved.  SCr 1.0>>1.2 K 4.4   SBPs 120s    Remains on abx for Chron's flare. Abdominal pain much improved. Now tolerating solid diet ok. No further rectal bleeding.    Objective:   Weight Range: 68.4 kg Body mass index is 20.45 kg/m.   Vital Signs:   Temp:  [98 F (36.7 C)-98.9 F (37.2 C)] 98.4 F (36.9 C) (05/14 0738) Pulse Rate:  [84-96] 84 (05/14 0454) Resp:  [18-20] 18 (05/14 0738) BP: (101-125)/(71-89) 125/89 (05/14 0738) SpO2:  [98 %-99 %] 99 % (05/14 0738) Weight:  [68.4 kg] 68.4 kg (05/14 0454) Last BM Date : 11/30/23  Weight change: Filed Weights   11/30/23 2123 12/01/23 0408 12/02/23 0454  Weight: 74.8 kg 74.5 kg 68.4 kg    Intake/Output:   Intake/Output Summary (Last 24 hours) at 12/02/2023 0943 Last data filed at 12/02/2023 0854 Gross per 24 hour  Intake 480 ml  Output --  Net 480 ml      Physical Exam    General:  Well appearing. No respiratory difficulty HEENT: normal Neck: supple. no JVD. Carotids 2+ bilat; no bruits. No lymphadenopathy or thyromegaly appreciated. Cor: PMI nondisplaced. Regular rate & rhythm. No rubs, gallops or murmurs. Lungs: clear Abdomen: soft, nontender, nondistended. No hepatosplenomegaly. No bruits or masses. Good bowel sounds. Extremities: no cyanosis, clubbing, rash, edema Neuro: alert & oriented x 3, cranial nerves grossly intact. moves all 4 extremities w/o difficulty. Affect pleasant.   Telemetry   NSR 90s personally reviewed   EKG    N/A   Labs    CBC Recent Labs    11/30/23 1132 12/01/23 0027  WBC 10.8* 9.5  HGB 9.5* 8.3*  HCT 33.9* 28.7*  MCV 65.2* 64.6*  PLT 448* 384   Basic Metabolic Panel Recent Labs    47/82/95 0027 12/02/23 0434  NA 136 136  K 4.3  4.4  CL 107 104  CO2 20* 27  GLUCOSE 102* 99  BUN 17 17  CREATININE 1.01 1.21  CALCIUM  8.1* 7.9*  MG  --  1.9   Liver Function Tests Recent Labs    11/30/23 1132 12/01/23 0027  AST 19 14*  ALT 9 9  ALKPHOS 71 62  BILITOT 0.7 0.5  PROT 7.0 6.0*  ALBUMIN 2.8* 2.4*   No results for input(s): "LIPASE", "AMYLASE" in the last 72 hours. Cardiac Enzymes No results for input(s): "CKTOTAL", "CKMB", "CKMBINDEX", "TROPONINI" in the last 72 hours.  BNP: BNP (last 3 results) Recent Labs    10/07/23 2312 10/19/23 1242 11/30/23 1132  BNP 1,830.7* 801.6* 1,539.9*    ProBNP (last 3 results) No results for input(s): "PROBNP" in the last 8760 hours.   D-Dimer No results for input(s): "DDIMER" in the last 72 hours. Hemoglobin A1C No results for input(s): "HGBA1C" in the last 72 hours. Fasting Lipid Panel No results for input(s): "CHOL", "HDL", "LDLCALC", "TRIG", "CHOLHDL", "LDLDIRECT" in the last 72 hours. Thyroid Function Tests No results for input(s): "TSH", "T4TOTAL", "T3FREE", "THYROIDAB" in the last 72 hours.  Invalid input(s): "FREET3"  Other results:   Imaging    No results found.    Medications:     Scheduled Medications:  aspirin  EC  81 mg Oral Daily   atorvastatin   40 mg Oral  Daily   digoxin   0.125 mg Oral Daily   sodium chloride  flush  3 mL Intravenous Q12H   spironolactone   12.5 mg Oral Daily    Infusions:  cefTRIAXone  (ROCEPHIN )  IV 2 g (12/02/23 0806)   metronidazole  500 mg (12/02/23 0550)    PRN Medications: acetaminophen  **OR** acetaminophen , HYDROmorphone  (DILAUDID ) injection, naLOXone (NARCAN)  injection, mouth rinse    Patient Profile   Shaun [name] is a 38 y.o. male with history of CAD w/ prior MI in 2017, IV drug abuse, ischemic cardiomyopathy s/p ICD, chronic systolic CHF, HTN, Crohn's disease with perianal fistula, untreated chronic HepC, hx DVT/PE 10/24.   Assessment/Plan   1. 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  Has ICD.  Echo in 3/25 with EF < 20%, severe LV dilation, mildly decreased RV systolic function, severe MR.  Cardiac MRI (3/25) with LV EF 13%, moderate RV dysfunction, moderate-severe MR,  coronary-type LGE in the mid-apical anterior and anteroseptal walls suggestive of prior MI. I suspect a mixed ischemic/nonischemic cardiomyopathy as the patient has global severe hypokinesis. He was only taking Coreg  at home, was out of diuretics. He did not followup in CHF clinic.  He p/w significant volume overloaded.  Good diuresis so far with IV Lasix . Wt down 14 lb. Near euvolemic  - start Entresto 24-26 mg bid   - continue digoxin  0.125 daily.  - Hold beta blocker with volume overload, ?low output.  - continue spironolactone  12.5 daily.  - No SGLT2 inhibitor with peri-anal fistula.  - Not candidate for advanced therapies with active substance abuse, social situation, and noncompliance.  2. CAD: STEMI in 2017 in setting of cocaine, heroin and tobacco use. S/p DES to ostial/prox LAD and mid LAD, PTCA to D1.  No chest pain.  - Continue statin + ASA 81.  3. Mitral regurgitation: Possibly severe.  Functional MR.  - Can consider Mitraclip in the future if he can be compliant with medical therapy.  4. Possible LV thrombus: Not seen on 3/25 cMRI.  - Eliquis  on hold with rectal bleeding.  5. Polysubstance abuse: h/o cocaine, methamphetamines.  UDS only positive of THC this admission.  6. Crohn's disease: Flare this admission, still with abdominal pain.  Has peri-anal fistula.  - GI following.  - On ceftriaxone /Flagyl .  - symptomatically improving, tolerating solid diet    Length of Stay: 1  Shaun Simmons, PA-C  12/02/2023, 9:43 AM  Advanced Heart Failure Team Pager [phone] (M-F; 7a - 5p)  Please contact [facility] for night-coverage after hours (5p -7a ) and weekends on [email or web address]  Patient seen with PA, I formulated the plan and  agree with the above note.   He diuresed very well yesterday.  Breathing better.  Still with rectal bleeding.   General: NAD Neck: No JVD, no thyromegaly or thyroid nodule.  Lungs: Clear to auscultation bilaterally with normal respiratory effort. CV: Nondisplaced PMI.  Heart regular S1/S2, no S3/S4, 2/6 HSM apex.  No peripheral edema.   Abdomen: Soft, nontender, no hepatosplenomegaly, no distention.  Skin: Intact without lesions or rashes.  Neurologic: Alert and oriented x 3.  Psych: Normal affect. Extremities: No clubbing or cyanosis.  HEENT: Normal.   Patient looks euvolemic.  Can stop Lasix  for now, would start torsemide  po tomorrow.  Agree with continuing spironolactone  and starting Entresto 24/26 bid today. Continue digoxin . Can probably start low dose Coreg  again at discharge.   Shaun [name] 12/02/2023 2:57 PM

## 2023-12-02 NOTE — Progress Notes (Signed)
 PROGRESS NOTE    Shaun [name]  WUJ:[id number] DOB: October 14, 1985 DOA: 11/30/2023 PCP: Otis Blocker, PA   Brief Narrative: 38 year old with past medical history significant for hypertension, hyperlipidemia, ventricular tachycardia status post ICD, CAD, Crohn's disease, hepatitis C, DVT/PE, substance use, depression, chronic systolic heart failure presenting with rectal pain and rectal bleeding.  Pain started a week prior to admission.  He was evaluated in March due to acute on chronic heart failure with severely reduced ejection fraction and possible left ventricular thrombus with severe mitral regurgitation.  He was discharged on Eliquis .  CT abdomen and pelvis showed changes consistent with a small perirectal abscess, as well as rectal wall thickening possible consistent with IBD and colonic thickening.  Surgery was consulted, they do not think patient has a perirectal abscess.  Plan To treat for fistula.  GI has been following.  Plan to stop IV ceftriaxone  today and continue with Flagyl .  GI plan to start steroids today.  We might be able to resume Eliquis  tomorrow if hemoglobin remained stable   Assessment & Plan:   Principal Problem:   Acute GI bleeding Active Problems:   History of DVT (deep vein thrombosis)   Coronary artery disease involving native coronary artery of native heart without angina pectoris   Essential hypertension   Mixed hyperlipidemia   Chronic systolic CHF (congestive heart failure) (HCC)   AICD (automatic cardioverter/defibrillator) present   Hepatitis C virus infection without hepatic coma   Crohn's disease (HCC)   Hx of ventricular tachycardia   Severe major depression without psychotic features (HCC)   Substance abuse (HCC)   Crohn's colitis (HCC)   Perirectal cellulitis   Rectal pain   Crohn's disease of perianal region with abscess (HCC)   Crohn's disease of colon (HCC)   Perianal fistula  1-Rectal bleed, Crohn's flare, perirectal cellulitis,  peri-rectal fistula Patient presented with rectal pain and bright blood per rectum. Evaluated by surgery, do not think patient has a perirectal abscess Initially treated with IV ceftriaxone  and Flagyl , GI recommended to stop ceftriaxone  and continue with Flagyl  Continue to monitor hemoglobin - Able to resume Eliquis  tomorrow if hemoglobin is stable, discussed with GI - Continue IV Dilaudid , started Percocet  Iron  deficiency anemia acute on chronic blood loss. 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  - Continue digoxin , Lipitor, spironolactone  - Heart failure team adjusting medications for heart failure  Hypertensive urgency: Diastolic was up to 100 in the ED has subsequently normalized  History of V. tach Status post ICD  History of DVT and PE: Holding Eliquis  due to GI bleed.  Might be able to resume tomorrow  History of chronic hepatitis C: Will need to follow-up with ID for further care.  History of cocaine and methamphetamine use - Noted   THC abuse: UDS positive for THC.   HCV antibodies positive: Noted.  Nicotine  Patch ordered.   Estimated body mass index is 20.45 kg/m as calculated from the following:   Height as of this encounter: 6' (1.829 m).   Weight as of this encounter: 68.4 kg.   DVT prophylaxis: SCDs Code Status: Full code Family Communication: Discussed with patient Disposition Plan:  Status is: Inpatient Remains inpatient appropriate because: GI bleed and heart failure    Consultants:  Cardiology  GI  Surgery   Procedures:   Antimicrobials:    Subjective: He is feeling better, rectal pain is better. Had Bloody stool yesterday.   Objective: Vitals:   12/01/23 1923 12/01/23 2349 12/02/23  0454 12/02/23 0738  BP: 121/83 114/77 101/71 125/89  Pulse: 93 95 84   Resp: 20 20 20 18   Temp: 98.3 F (36.8 C) 98.9 F (37.2 C) 98 F (36.7 C) 98.4 F (36.9 C)  TempSrc: Oral Oral Oral Oral  SpO2: 99% 99% 99% 99%  Weight:   68.4 kg   Height:        Intake/Output Summary (Last 24 hours) at 12/02/2023 0826 Last data filed at 12/01/2023 1356 Gross per 24 hour  Intake 240 ml  Output --  Net 240 ml   Filed Weights   11/30/23 2123 12/01/23 0408 12/02/23 0454  Weight: 74.8 kg 74.5 kg 68.4 kg    Examination:  General exam: Appears calm and comfortable  Respiratory system: Clear to auscultation. Respiratory effort normal. Cardiovascular system: S1 & S2 heard, RRR. No JVD, murmurs, rubs, gallops or clicks. No pedal edema. Gastrointestinal system: Abdomen is nondistended, soft and nontender. No organomegaly or masses felt. Normal bowel sounds heard. Central nervous system: Alert and oriented.  Extremities: Symmetric 5 x 5 power.    Data Reviewed: I have personally reviewed following labs and imaging studies  CBC: Recent Labs  Lab 11/30/23 1132 12/01/23 0027  WBC 10.8* 9.5  HGB 9.5* 8.3*  HCT 33.9* 28.7*  MCV 65.2* 64.6*  PLT 448* 384   Basic Metabolic Panel: Recent Labs  Lab 11/30/23 1132 12/01/23 0027 12/02/23 0434  NA 136 136 136  K 4.0 4.3 4.4  CL 105 107 104  CO2 23 20* 27  GLUCOSE 104* 102* 99  BUN 12 17 17   CREATININE 1.10 1.01 1.21  CALCIUM  8.2* 8.1* 7.9*  MG  --   --  1.9   GFR: Estimated Creatinine Clearance: 80.1 mL/min (by C-G formula based on SCr of 1.21 mg/dL). Liver Function Tests: Recent Labs  Lab 11/30/23 1132 12/01/23 0027  AST 19 14*  ALT 9 9  ALKPHOS 71 62  BILITOT 0.7 0.5  PROT 7.0 6.0*  ALBUMIN 2.8* 2.4*   No results for input(s): "LIPASE", "AMYLASE" in the last 168 hours. No results for input(s): "AMMONIA" in the last 168 hours. Coagulation Profile: Recent Labs  Lab 12/01/23 0027  INR 1.3*   Cardiac Enzymes: No  results for input(s): "CKTOTAL", "CKMB", "CKMBINDEX", "TROPONINI" in the last 168 hours. BNP (last 3 results) No results for input(s): "PROBNP" in the last 8760 hours. HbA1C: No results for input(s): "HGBA1C" in the last 72 hours. CBG: No results for input(s): "GLUCAP" in the last 168 hours. Lipid Profile: No results for input(s): "CHOL", "HDL", "LDLCALC", "TRIG", "CHOLHDL", "LDLDIRECT" in the last 72 hours. Thyroid Function Tests: No results for input(s): "TSH", "T4TOTAL", "FREET4", "T3FREE", "THYROIDAB" in the last 72 hours. Anemia Panel: Recent Labs    11/30/23 1740  FERRITIN 6*  TIBC 427  IRON  17*   Sepsis Labs: Recent Labs  Lab 11/30/23 1749 11/30/23 2139 12/01/23 0027  PROCALCITON  --   --  <0.10  LATICACIDVEN 1.4 1.6 0.9    Recent Results (from the past 240 hours)  Blood culture (routine x 2)     Status: None (Preliminary result)   Collection Time: 11/30/23 11:02 AM   Specimen: BLOOD RIGHT HAND  Result Value Ref Range Status   Specimen Description BLOOD RIGHT HAND  Final  Special Requests   Final    BOTTLES DRAWN AEROBIC AND ANAEROBIC Blood Culture results may not be optimal due to an inadequate volume of blood received in culture bottles   Culture   Final    NO GROWTH < 24 HOURS Performed at [facility], [address] [place], [place] [address]    Report Status PENDING  Incomplete  Blood culture (routine x 2)     Status: None (Preliminary result)   Collection Time: 11/30/23  1:42 PM   Specimen: BLOOD  Result Value Ref Range Status   Specimen Description BLOOD BLOOD RIGHT FOREARM  Final   Special Requests   Final    BOTTLES DRAWN AEROBIC AND ANAEROBIC Blood Culture results may not be optimal due to an inadequate volume of blood received in culture bottles   Culture   Final    NO GROWTH < 24 HOURS Performed at [facility], [address], [place], [place] [address]    Report Status PENDING  Incomplete         Radiology Studies: CT  ABDOMEN PELVIS W CONTRAST Result Date: 11/30/2023 CLINICAL DATA:  Crohn's exacerbation, rectal pain EXAM: CT ABDOMEN AND PELVIS WITH CONTRAST TECHNIQUE: Multidetector CT imaging of the abdomen and pelvis was performed using the standard protocol following bolus administration of intravenous contrast. RADIATION DOSE REDUCTION: This exam was performed according to the departmental dose-optimization program which includes automated exposure control, adjustment of the mA and/or kV according to patient size and/or use of iterative reconstruction technique. CONTRAST:  75mL OMNIPAQUE  IOHEXOL  350 MG/ML SOLN COMPARISON:  CT of the abdomen pelvis performed October 07, 2023 FINDINGS: Lower chest: Nothing significant. Hepatobiliary: No focal liver abnormality is seen. No gallstones, gallbladder wall thickening, or biliary dilatation. Pancreas: Unremarkable. No pancreatic ductal dilatation or surrounding inflammatory changes. Spleen: Normal in size without focal abnormality. Adrenals/Urinary Tract: The adrenal glands are within normal limits. Unchanged appearance of 2 cm left lower pole renal cyst. Stomach/Bowel: The stomach is nondilated. Scattered air-fluid levels are present within the small bowel. There are segments of small bowel which are mildly dilated in diameter and fluid-filled. There is no clear transition point. Wall thickening is suspected in the left colon with mild inflammatory stranding in the adjacent fat. The rectum is decompressed and wall thickening is favored. There is a small focus of soft tissue thickening in the medial right gluteal soft tissues with a possible small focus of air annotated on image 93 of series 5. This may represent a small perianal abscess or site of soft tissue infection. Vascular/Lymphatic: Mildly prominent, but not clearly pathologically enlarged lymph nodes are present. No significant vascular abnormality is appreciated. Reproductive: Nothing significant. Other: No abdominal wall  hernia or abnormality. No abdominopelvic ascites. Musculoskeletal: No acute or significant osseous findings. IMPRESSION: 1. Small focus of soft tissue thickening and air in the medial right gluteal soft tissues, possibly representing a small perianal abscess or soft tissue infection. 2. Diffuse rectal wall thickening which can be observed in the setting of inflammatory bowel disease. 3. Mild diffuse thickening of the left colon, similar when compared to CT performed October 07, 2023. 4. No evidence of peritoneal abscess. Electronically Signed   By: Reagan Camera M.D.   On: 11/30/2023 14:36   CT Angio Chest PE W and/or Wo Contrast Result Date: 11/30/2023 CLINICAL DATA:  Pulmonary embolism suspected EXAM: CT ANGIOGRAPHY CHEST WITH CONTRAST TECHNIQUE: Multidetector CT imaging of the chest was performed using the standard protocol during bolus  administration of intravenous contrast. Multiplanar CT image reconstructions and MIPs were obtained to evaluate the vascular anatomy. Multiplanar image (3D post-processing) reconstructions and MIPs were obtained to evaluate the vascular anatomy. RADIATION DOSE REDUCTION: This exam was performed according to the departmental dose-optimization program which includes automated exposure control, adjustment of the mA and/or kV according to patient size and/or use of iterative reconstruction technique. CONTRAST:  75mL OMNIPAQUE  IOHEXOL  350 MG/ML SOLN COMPARISON:  CT angiogram of the chest performed October 07, 2023 FINDINGS: Cardiovascular: Enlarged heart. Main pulmonary artery is within normal limits for size. No evidence of pulmonary embolism to the segmental level. Mediastinum/Nodes: No enlarged mediastinal, hilar, or axillary lymph nodes. Thyroid gland, trachea, and esophagus demonstrate no significant findings. Lungs/Pleura: Lungs are clear. No pleural effusion or pneumothorax. Upper Abdomen: Reported separately Musculoskeletal: No chest wall abnormality. No acute or significant  osseous findings. Review of the MIP images confirms the above findings. IMPRESSION: 1. No evidence of pulmonary embolism to the segmental level. Electronically Signed   By: Reagan Camera M.D.   On: 11/30/2023 14:26        Scheduled Meds:  aspirin  EC  81 mg Oral Daily   atorvastatin   40 mg Oral Daily   digoxin   0.125 mg Oral Daily   sodium chloride  flush  3 mL Intravenous Q12H   spironolactone   12.5 mg Oral Daily   Continuous Infusions:  cefTRIAXone  (ROCEPHIN )  IV 2 g (12/02/23 0806)   metronidazole  500 mg (12/02/23 0550)     LOS: 1 day    Time spent:  35 minutes    Yoland Scherr A Neamiah Sciarra, MD [facility]   If 7PM-7AM, please contact night-coverage [email or web address]  12/02/2023, 8:26 AM

## 2023-12-02 NOTE — Progress Notes (Signed)
 D/w GI, ok for just one of Venofer  inpt for now.   Ivery Marking, PharmD, BCIDP, AAHIVP, CPP Infectious Disease Pharmacist 12/02/2023 3:53 PM

## 2023-12-02 NOTE — Plan of Care (Signed)

## 2023-12-03 LAB — GASTROINTESTINAL PANEL BY PCR, STOOL (REPLACES STOOL CULTURE)

## 2023-12-03 LAB — CBC
HCT: 30.9 % — ABNORMAL LOW (ref 39.0–52.0)
Hemoglobin: 8.6 g/dL — ABNORMAL LOW (ref 13.0–17.0)
MCH: 18.2 pg — ABNORMAL LOW (ref 26.0–34.0)
MCHC: 27.8 g/dL — ABNORMAL LOW (ref 30.0–36.0)
MCV: 65.5 fL — ABNORMAL LOW (ref 80.0–100.0)
Platelets: 369 10*3/uL (ref 150–400)
RBC: 4.72 MIL/uL (ref 4.22–5.81)
RDW: 19.7 % — ABNORMAL HIGH (ref 11.5–15.5)
WBC: 8.2 10*3/uL (ref 4.0–10.5)
nRBC: 0 % (ref 0.0–0.2)

## 2023-12-03 LAB — BASIC METABOLIC PANEL WITH GFR
Anion gap: 6 (ref 5–15)
BUN: 18 mg/dL (ref 6–20)
CO2: 23 mmol/L (ref 22–32)
Calcium: 8.2 mg/dL — ABNORMAL LOW (ref 8.9–10.3)
Chloride: 107 mmol/L (ref 98–111)
Creatinine, Ser: 1.22 mg/dL (ref 0.61–1.24)
GFR, Estimated: >60 mL/min (ref >60)
Glucose, Bld: 120 mg/dL — ABNORMAL HIGH (ref 70–99)
Potassium: 5.1 mmol/L (ref 3.5–5.1)
Sodium: 136 mmol/L (ref 135–145)

## 2023-12-03 NOTE — Progress Notes (Signed)
 Pt walked off unit AMA. Catharine Clock, MD notified.

## 2023-12-03 NOTE — Discharge Summary (Signed)
 Physician Discharge Summary   Patient: Shaun [name] MRN: [id number] DOB: 1986-01-11  Admit date:     11/30/2023  Discharge date: 12/03/23  Discharge Physician: Danette Duos   PCP: Otis Blocker, PA   Recommendations at discharge:   Left AMA, I was not able to see him prior to discharge. Left without meds.  Discharge Diagnoses: Principal Problem:   Bright red rectal bleeding Active Problems:   History of DVT (deep vein thrombosis)   Coronary artery disease involving native coronary artery of native heart without angina pectoris   Essential hypertension   Mixed hyperlipidemia   Chronic systolic CHF (congestive heart failure) (HCC)   AICD (automatic cardioverter/defibrillator) present   Hepatitis C virus infection without hepatic coma   Crohn's disease (HCC)   Hx of ventricular tachycardia   Severe major depression without psychotic features (HCC)   Substance abuse (HCC)   Crohn's colitis (HCC)   Perirectal cellulitis   Rectal pain   Crohn's 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 *  Hospital Course: 38 year old with past medical history significant for hypertension, hyperlipidemia, ventricular tachycardia status post ICD, CAD, Crohn's disease, hepatitis C, DVT/PE, substance use, depression, chronic systolic heart failure presenting with rectal pain and rectal bleeding.  Pain started a week prior to admission.   He was evaluated in March due to acute on chronic heart failure with severely reduced ejection fraction and possible left ventricular thrombus with severe mitral regurgitation.  He was discharged on Eliquis .   CT abdomen and pelvis showed changes consistent with a small perirectal abscess, as well as rectal wall thickening possible consistent with IBD and colonic thickening.  Surgery was consulted, they do not think patient has a perirectal  abscess.  Plan To treat for fistula.  GI has been following.  Plan to stop IV ceftriaxone  today and continue with Flagyl .  GI plan to start steroids today.  We might be able to resume Eliquis  tomorrow if hemoglobin remained stable    Assessment and Plan: Left AMA 1-Rectal bleed, Crohn's flare, perirectal cellulitis, peri-rectal fistula Patient presented with rectal pain and bright blood per rectum. Evaluated by surgery, do not think patient has a perirectal abscess Initially treated with IV ceftriaxone  and Flagyl , GI recommended to stop ceftriaxone  and continue with Flagyl  Continue to monitor hemoglobin - Able to resume Eliquis  tomorrow if hemoglobin is stable, discussed with GI - Continue IV Dilaudid , started Percocet   Iron  deficiency anemia acute on chronic blood loss. 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  - Continue digoxin , Lipitor, spironolactone  - Heart failure team adjusting medications for heart failure   Hypertensive urgency: Diastolic was up to 100 in the ED has subsequently normalized   History of V. tach Status post ICD   History of DVT and PE: Holding Eliquis  due to GI bleed.  Might be able to resume tomorrow   History of chronic hepatitis C: Will need to follow-up with ID for further care.   History of cocaine and methamphetamine use - Noted   THC abuse: UDS positive for THC.   HCV antibodies positive: Noted.   Nicotine  Patch ordered.    Estimated body  mass index is 20.45 kg/m as calculated from the following:   Height as of this encounter: 6' (1.829 m).   Weight as of this encounter: 68.4 kg.          Consultants: Cardiology  Procedures performed:    DISCHARGE MEDICATION: Allergies as of  12/03/2023       Reactions   Penicillins Hives, Swelling   Codeine Itching   Ketorolac  Itching, Other (See Comments)   nightmares        Medication List     ASK your doctor about these medications    aspirin  EC 81 MG tablet Take 81 mg by mouth daily. Swallow whole. Ask about: Which instructions should I use?   carvedilol  3.125 MG tablet Commonly known as: COREG  Take 1 tablet (3.125 mg total) by mouth 2 (two) times daily with a meal.   digoxin  0.125 MG tablet Commonly known as: LANOXIN  Take 1 tablet (0.125 mg total) by mouth daily.   Eliquis  5 MG Tabs tablet Generic drug: apixaban  Take 1 tablet (5 mg total) by mouth 2 (two) times daily.   losartan  25 MG tablet Commonly known as: COZAAR  Take 0.5 tablets (12.5 mg total) by mouth daily.   polyethylene glycol powder 17 GM/SCOOP powder Commonly known as: GLYCOLAX /MIRALAX  Take 1 capful (17 g) by mouth daily as needed for mild constipation.   spironolactone  25 MG tablet Commonly known as: ALDACTONE  Take 1 tablet (25 mg total) by mouth daily.   torsemide  20 MG tablet Commonly known as: DEMADEX  Take 2 tablets (40 mg total) by mouth daily.        Discharge Exam: Filed Weights   12/01/23 0408 12/02/23 0454 12/03/23 0500  Weight: 74.5 kg 68.4 kg 68.3 kg   Not evaluated  Condition at discharge: left AMA  The results of significant diagnostics from this hospitalization (including imaging, microbiology, ancillary and laboratory) are listed below for reference.   Imaging Studies: CT ABDOMEN PELVIS W CONTRAST Result Date: 11/30/2023 CLINICAL DATA:  Crohn's exacerbation, rectal pain EXAM: CT ABDOMEN AND PELVIS WITH CONTRAST TECHNIQUE: Multidetector CT imaging of the abdomen and pelvis was performed using the standard protocol following bolus administration of intravenous contrast. RADIATION DOSE REDUCTION: This exam was performed according to the departmental dose-optimization program which includes automated exposure  control, adjustment of the mA and/or kV according to patient size and/or use of iterative reconstruction technique. CONTRAST:  75mL OMNIPAQUE  IOHEXOL  350 MG/ML SOLN COMPARISON:  CT of the abdomen pelvis performed October 07, 2023 FINDINGS: Lower chest: Nothing significant. Hepatobiliary: No focal liver abnormality is seen. No gallstones, gallbladder wall thickening, or biliary dilatation. Pancreas: Unremarkable. No pancreatic ductal dilatation or surrounding inflammatory changes. Spleen: Normal in size without focal abnormality. Adrenals/Urinary Tract: The adrenal glands are within normal limits. Unchanged appearance of 2 cm left lower pole renal cyst. Stomach/Bowel: The stomach is nondilated. Scattered air-fluid levels are present within the small bowel. There are segments of small bowel which are mildly dilated in diameter and fluid-filled. There is no clear transition point. Wall thickening is suspected in the left colon with mild inflammatory stranding in the adjacent fat. The rectum is decompressed and wall thickening is favored. There is a small focus of soft tissue thickening in the medial right gluteal soft tissues with a possible small focus of air annotated on image 93 of series 5. This may represent a small perianal abscess or site of soft tissue infection. Vascular/Lymphatic: Mildly prominent, but not clearly pathologically enlarged lymph nodes are present. No significant vascular  abnormality is appreciated. Reproductive: Nothing significant. Other: No abdominal wall hernia or abnormality. No abdominopelvic ascites. Musculoskeletal: No acute or significant osseous findings. IMPRESSION: 1. Small focus of soft tissue thickening and air in the medial right gluteal soft tissues, possibly representing a small perianal abscess or soft tissue infection. 2. Diffuse rectal wall thickening which can be observed in the setting of inflammatory bowel disease. 3. Mild diffuse thickening of the left colon, similar when  compared to CT performed October 07, 2023. 4. No evidence of peritoneal abscess. Electronically Signed   By: Reagan Camera M.D.   On: 11/30/2023 14:36   CT Angio Chest PE W and/or Wo Contrast Result Date: 11/30/2023 CLINICAL DATA:  Pulmonary embolism suspected EXAM: CT ANGIOGRAPHY CHEST WITH CONTRAST TECHNIQUE: Multidetector CT imaging of the chest was performed using the standard protocol during bolus administration of intravenous contrast. Multiplanar CT image reconstructions and MIPs were obtained to evaluate the vascular anatomy. Multiplanar image (3D post-processing) reconstructions and MIPs were obtained to evaluate the vascular anatomy. RADIATION DOSE REDUCTION: This exam was performed according to the departmental dose-optimization program which includes automated exposure control, adjustment of the mA and/or kV according to patient size and/or use of iterative reconstruction technique. CONTRAST:  75mL OMNIPAQUE  IOHEXOL  350 MG/ML SOLN COMPARISON:  CT angiogram of the chest performed October 07, 2023 FINDINGS: Cardiovascular: Enlarged heart. Main pulmonary artery is within normal limits for size. No evidence of pulmonary embolism to the segmental level. Mediastinum/Nodes: No enlarged mediastinal, hilar, or axillary lymph nodes. Thyroid gland, trachea, and esophagus demonstrate no significant findings. Lungs/Pleura: Lungs are clear. No pleural effusion or pneumothorax. Upper Abdomen: Reported separately Musculoskeletal: No chest wall abnormality. No acute or significant osseous findings. Review of the MIP images confirms the above findings. IMPRESSION: 1. No evidence of pulmonary embolism to the segmental level. Electronically Signed   By: Reagan Camera M.D.   On: 11/30/2023 14:26    Microbiology: Results for orders placed or performed during the hospital encounter of 11/30/23  Blood culture (routine x 2)     Status: None (Preliminary result)   Collection Time: 11/30/23 11:02 AM   Specimen: BLOOD RIGHT  HAND  Result Value Ref Range Status   Specimen Description BLOOD RIGHT HAND  Final   Special Requests   Final    BOTTLES DRAWN AEROBIC AND ANAEROBIC Blood Culture results may not be optimal due to an inadequate volume of blood received in culture bottles   Culture   Final    NO GROWTH 3 DAYS Performed at [facility], [address] [place], [place] [address]    Report Status PENDING  Incomplete  Blood culture (routine x 2)     Status: None (Preliminary result)   Collection Time: 11/30/23  1:42 PM   Specimen: BLOOD  Result Value Ref Range Status   Specimen Description BLOOD BLOOD RIGHT FOREARM  Final   Special Requests   Final    BOTTLES DRAWN AEROBIC AND ANAEROBIC Blood Culture results may not be optimal due to an inadequate volume of blood received in culture bottles   Culture   Final    NO GROWTH 3 DAYS Performed at [facility], [address] [place], [place] [address]    Report Status PENDING  Incomplete  Gastrointestinal Panel by PCR , Stool     Status: None   Collection Time: 11/30/23  4:02 PM   Specimen: Stool  Result Value Ref Range Status   Campylobacter species NOT DETECTED NOT DETECTED  Final   Plesimonas shigelloides NOT DETECTED NOT DETECTED Final   Salmonella species NOT DETECTED NOT DETECTED Final   Yersinia enterocolitica NOT DETECTED NOT DETECTED Final   Vibrio species NOT DETECTED NOT DETECTED Final   Vibrio cholerae NOT DETECTED NOT DETECTED Final   Enteroaggregative E coli (EAEC) NOT DETECTED NOT DETECTED Final   Enteropathogenic E coli (EPEC) NOT DETECTED NOT DETECTED Final   Enterotoxigenic E coli (ETEC) NOT DETECTED NOT DETECTED Final   Shiga like toxin producing E coli (STEC) NOT DETECTED NOT DETECTED Final   Shigella/Enteroinvasive E coli (EIEC) NOT DETECTED NOT DETECTED Final   Cryptosporidium NOT DETECTED NOT DETECTED Final   Cyclospora cayetanensis NOT DETECTED NOT DETECTED Final   Entamoeba histolytica NOT DETECTED NOT DETECTED Final    Giardia lamblia NOT DETECTED NOT DETECTED Final   Adenovirus F40/41 NOT DETECTED NOT DETECTED Final   Astrovirus NOT DETECTED NOT DETECTED Final   Norovirus GI/GII NOT DETECTED NOT DETECTED Final   Rotavirus A NOT DETECTED NOT DETECTED Final   Sapovirus (I, II, IV, and V) NOT DETECTED NOT DETECTED Final    Comment: Performed at [facility], [address] [place], [place] [address]  C Difficile Quick Screen w PCR reflex     Status: None   Collection Time: 12/02/23  3:31 PM   Specimen: STOOL  Result Value Ref Range Status   C Diff antigen NEGATIVE NEGATIVE Final   C Diff toxin NEGATIVE NEGATIVE Final   C Diff interpretation No C. difficile detected.  Final    Comment: Performed at [facility], [address], [place], [place] [address]    Labs: CBC: Recent Labs  Lab 11/30/23 1132 12/01/23 0027 12/03/23 0549  WBC 10.8* 9.5 8.2  HGB 9.5* 8.3* 8.6*  HCT 33.9* 28.7* 30.9*  MCV 65.2* 64.6* 65.5*  PLT 448* 384 369   Basic Metabolic Panel: Recent Labs  Lab 11/30/23 1132 12/01/23 0027 12/02/23 0434 12/03/23 0549  NA 136 136 136 136  K 4.0 4.3 4.4 5.1  CL 105 107 104 107  CO2 23 20* 27 23  GLUCOSE 104* 102* 99 120*  BUN 12 17 17 18   CREATININE 1.10 1.01 1.21 1.22  CALCIUM  8.2* 8.1* 7.9* 8.2*  MG  --   --  1.9  --    Liver Function Tests: Recent Labs  Lab 11/30/23 1132 12/01/23 0027  AST 19 14*  ALT 9 9  ALKPHOS 71 62  BILITOT 0.7 0.5  PROT 7.0 6.0*  ALBUMIN 2.8* 2.4*   CBG: No results for input(s): "GLUCAP" in the last 168 hours.  Discharge time spent: less than 30 minutes.  Signed: Danette Duos, MD [facility] 12/03/2023

## 2023-12-03 NOTE — Plan of Care (Signed)
  Problem: Pain Managment: Goal: General experience of comfort will improve and/or be controlled Outcome: Progressing   Problem: Safety: Goal: Ability to remain free from injury will improve Outcome: Progressing   Problem: Skin Integrity: Goal: Risk for impaired skin integrity will decrease Outcome: Progressing

## 2023-12-03 NOTE — Plan of Care (Signed)
   Problem: Education: Goal: Knowledge of General Education information will improve Description Including pain rating scale, medication(s)/side effects and non-pharmacologic comfort measures Outcome: Progressing

## 2023-12-05 LAB — CULTURE, BLOOD (ROUTINE X 2)
Culture: NO GROWTH
Culture: NO GROWTH

## 2023-12-05 LAB — CALPROTECTIN, FECAL: Calprotectin, Fecal: >8000 ug/g — ABNORMAL HIGH (ref 0–120)

## 2023-12-15 ENCOUNTER — Telehealth: Payer: Self-pay | Admitting: Gastroenterology

## 2023-12-15 NOTE — Telephone Encounter (Signed)
 Called and left detailed message that his appt will be canceled.

## 2023-12-15 NOTE — Telephone Encounter (Signed)
-----   Message from Nurse Jeremiah Monica sent at 12/15/2023  9:23 AM EDT ----- Regarding: appt This pts appt does need to be cancelled as Dr General Kenner stated he would not accept him as a pt as he was already noncompliant and left the hospital ama.

## 2023-12-18 ENCOUNTER — Telehealth (HOSPITAL_COMMUNITY): Payer: Self-pay

## 2023-12-18 NOTE — Telephone Encounter (Signed)
 Called And spoke to patient's mother Shaun [name] to confirm/remind patient of [facility].   Appointment:   [x] Confirmed  [] Left mess   [] No answer/No voice mail  [] VM Full/unable to leave message  [] Phone not in service  Patient reminded to bring all medications and/or complete list.  Confirmed patient has transportation. Gave directions, instructed to utilize valet parking.

## 2023-12-18 NOTE — Progress Notes (Signed)
 [facility] CONSULT NOTE   Primary Care: Otis Blocker, PA HF Cardiologist: Dr. Mitzie Anda  HPI: Shaun [name] is a 38 y.o. male with history of CAD w/ prior MI in 2017, IV drug abuse, ischemic cardiomyopathy s/p ICD, chronic systolic CHF, HTN, Crohn's disease with perianal fistula, untreated chronic HepC, hx DVT/PE 10/24.    Anterior STEMI c/b cardiac arrest in 2017 in setting of cocaine, heroin and tobacco use. S/p PCI/DES to ostial/prox LAD and mid LAD as well as PTCA to D1. EF 25% at time of MI.    EF has remained reduced. Echo 9/24 with EF 15-20%. Later had VT and underwent ICD placement in 10/24.    Admitted 10/24 with sepsis 2/2 MSSA bacteremia. [facility] with PICC line and IV antibiotics. Had TEE in 12/24 w/ no valvular or lead associated vegetations. LV function remained severely reduced.   Recent decompensation ED 10/07/23 in the setting of noncompliance with all medications for a few months d/t SE. CT A/P + colitis. Echo with EF < 20%, cannot rule out LV apical thrombus, RV mildly reduced, severe MR.   Cardiac MRI (3/25) with LV EF 13%, moderate RV dysfunction, moderate-severe MR,  coronary-type LGE in the mid-apical anterior and anteroseptal walls suggestive of prior MI.    Admitted 5/25 with a/c HF. Diuresed with IV lasix . Discharged home, weight 150 lbs.  Today he returns for post hospital HF follow up. Overall feeling poorly. He is SOB walking further distances on flat ground. + PND and foot swelling. Took bumex x 1 due to increased SOB. Only taking ASA, Coreg  and Eliquis  (only once daily). Denies palpitations, abnormal bleeding, CP, dizziness, or orthopnea. + PND. Appetite ok. Not weighing at home. Smokes 1 ppd cigarettes, no ETOH, smokes THC. Lives with brother.  ReDs reading: 47%, abnormal  [employer] Device (personally reviewed): Volume up, 1.5 hr/day activity, <0.1 hr/day AT/AF, 19 runs of NSVT, <0.1% VP  ECG (personally reviewed): NSR, +  LVH  Labs (5/25): K 5.1, creatinine 1.22, hgb 8.6  Cardiac Studies - cMRI 3/25: LVEF 13%, moderate RV dysfunction, moderate-severe MR, coronary-type LGE in the mid-apical anterior and anteroseptal walls suggestive of prior MI.   - Echo 3/25: EF< 20%, G3DD, RV mildly reduced, severe MR, cannot rule out LV thrombus  - Echo 6/21: EF 20-25%, normal RV, mild MR   SH: Released from prison on 10/05/23. Now living in [place]. Reports he did not use any drugs in prison other than suboxone. UDS 10/09/23 + opiates, amphetamine, THC.  Past Medical History:  Diagnosis Date   Acute deep vein thrombosis (DVT) of brachial vein of left upper extremity (HCC) 05/19/2023   Beta-hemolytic group B streptococcal sepsis (HCC) 10/2019   at [facility]   Cellulitis of left upper extremity 12/30/2019   Chronic systolic CHF (congestive heart failure) (HCC) 12/30/2019   Coronary artery disease involving native coronary artery of native heart without angina pectoris 12/30/2019   Essential hypertension 12/30/2019   Hypertension    Intravenous drug abuse (HCC) 12/30/2019   Ischemic cardiomyopathy 12/30/2019   Mixed hyperlipidemia 12/30/2019   Nicotine  dependence, cigarettes, uncomplicated 12/30/2019   Other pulmonary embolism without acute cor pulmonale (HCC) 05/19/2023   V tach (HCC) 04/22/2023   Current Outpatient Medications  Medication Sig Dispense Refill   apixaban  (ELIQUIS ) 5 MG TABS tablet Take 1 tablet (5 mg total) by mouth 2 (two) times daily. 60 tablet 2   potassium chloride  SA (KLOR-CON  M) 20 MEQ tablet Take  1 tablet (20 mEq total) by mouth daily. 30 tablet 11   polyethylene glycol powder (GLYCOLAX /MIRALAX ) 17 GM/SCOOP powder Take 1 capful (17 g) by mouth daily as needed for mild constipation. (Patient not taking: Reported on 12/21/2023) 238 g 0   torsemide  (DEMADEX ) 20 MG tablet Take 2 tablets (40 mg total) by mouth daily. 60 tablet 11   No current facility-administered medications for this  encounter.   Allergies  Allergen Reactions   Penicillins Hives and Swelling   Codeine Itching   Ketorolac  Itching and Other (See Comments)    nightmares   Social History   Socioeconomic History   Marital status: Single    Spouse name: Not on file   Number of children: Not on file   Years of education: Not on file   Highest education level: Not on file  Occupational History   Not on file  Tobacco Use   Smoking status: Every Day    Current packs/day: 1.00    Types: Cigarettes   Smokeless tobacco: Never  Substance and Sexual Activity   Alcohol use: No   Drug use: Yes    Types: IV, Marijuana    Comment: opiates   Sexual activity: Not on file  Other Topics Concern   Not on file  Social History Narrative   Not on file   Social Drivers of Health   Financial Resource Strain: High Risk (05/19/2023)   Received from [facility]   Overall Financial Resource Strain (CARDIA)    Difficulty of Paying Living Expenses: Very hard  Food Insecurity: No Food Insecurity (11/30/2023)   Hunger Vital Sign    Worried About Running Out of Food in the Last Year: Never true    Ran Out of Food in the Last Year: Never true  Transportation Needs: No Transportation Needs (11/30/2023)   PRAPARE - Administrator, Civil Service (Medical): No    Lack of Transportation (Non-Medical): No  Physical Activity: Not on file  Stress: Not on file  Social Connections: Unknown (07/02/2022)   Received from [facility]   Social Network    Social Network: Not on file  Intimate Partner Violence: Not At Risk (11/30/2023)   Humiliation, Afraid, Rape, and Kick questionnaire    Fear of Current or Ex-Partner: No    Emotionally Abused: No    Physically Abused: No    Sexually Abused: No    Family History  Family history unknown: Yes   Wt Readings from Last 3 Encounters:  12/21/23 76.7 kg (169 lb)  12/03/23 68.3 kg (150 lb 9.2 oz)  10/19/23 75 kg (165 lb 6.4 oz)   BP 112/72   Pulse 80   Wt  76.7 kg (169 lb)   SpO2 100%   BMI 22.92 kg/m   PHYSICAL EXAM: General:  NAD. No resp difficulty, walked into clinic HEENT: Normal Neck: Supple. JVP to jaw Cor: Regular rate & rhythm. No rubs, gallops or murmurs. Lungs: Clear Abdomen: Soft, nontender, nondistended.  Extremities: No cyanosis, clubbing, rash, trace BLE edema Neuro: Alert & oriented x 3, moves all 4 extremities w/o difficulty. Affect pleasant.  ASSESSMENT & PLAN: 1. 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  Has ICD.  Echo in 3/25 with EF < 20%, severe LV dilation, mildly decreased RV systolic function, severe MR.  Cardiac MRI (3/25) with LV EF 13%, moderate RV dysfunction, moderate-severe MR,  coronary-type LGE in the mid-apical anterior and anteroseptal walls suggestive of prior MI. I  suspect a mixed ischemic/nonischemic cardiomyopathy as the patient has global severe hypokinesis. Off most GDMT. NYHA IIb-III. He is volume overloaded today, weight up > 19 lbs, REDs 47%. Has taken 1 dose of Bumex since discharge. - Stop Coreg  with volume overload. - Start torsemide  40 mg daily + 20 KCL daily. BMET/BNP today, repeat BMET in 1 week - Plan to add back spiro ARB/ARNi over next couple of visits. - No SGLT2 inhibitor with peri-anal fistula.  - Previously on digoxin , not currently taking. Can consider adding next visit, but I worry about compliance and follow  up. - Not candidate for advanced therapies with active substance abuse, social situation, and noncompliance.  2. CAD: STEMI in 2017 in setting of cocaine, heroin and tobacco use. S/p DES to ostial/prox LAD and mid LAD, PTCA to D1.  No chest pain.  - Stop ASA with AC. - Should be on a statin. Address at follow up. 3. Mitral regurgitation: Possibly severe.  Functional MR.  - Can consider Mitraclip in the future if he can be compliant with medical therapy.  - Optimize HF 4. Possible LV thrombus: Not seen on  3/25 cMRI.  - Continue Eliquis  5 mg bid, discussed this is a twice a day medication.  5. Polysubstance abuse: h/o cocaine, methamphetamines.  UDS only positive of THC this admission.  - Smokes 1 ppd and THC daily. Discussed cessation. 6. Crohn's disease: Flare this past admission, with peri-anal fistula.  - Completed abx. Had post hospital GI appt but this was cancelled. Will give him # to office to call and re-schedule - Tolerating regular diet.   High risk for re-admission. Follow up in 3 weeks with PharmD and 6 weeks with APP.  Vernia Good, FNP-BC 12/21/23

## 2023-12-21 ENCOUNTER — Ambulatory Visit (HOSPITAL_COMMUNITY)
Admission: RE | Admit: 2023-12-21 | Discharge: 2023-12-21 | Disposition: A | Discharge status: Home / Self Care | Source: Ambulatory Visit | Attending: Family Medicine | Admitting: Family Medicine

## 2023-12-21 ENCOUNTER — Encounter (HOSPITAL_COMMUNITY): Payer: Self-pay

## 2023-12-21 VITALS — BP 112/72 | HR 80 | Wt 169.0 lb

## 2023-12-21 DIAGNOSIS — R0602 Shortness of breath: Secondary | ICD-10-CM | POA: Insufficient documentation

## 2023-12-21 DIAGNOSIS — Z7901 Long term (current) use of anticoagulants: Secondary | ICD-10-CM | POA: Diagnosis not present

## 2023-12-21 DIAGNOSIS — I2510 Atherosclerotic heart disease of native coronary artery without angina pectoris: Secondary | ICD-10-CM

## 2023-12-21 DIAGNOSIS — Z955 Presence of coronary angioplasty implant and graft: Secondary | ICD-10-CM | POA: Insufficient documentation

## 2023-12-21 DIAGNOSIS — I110 Hypertensive heart disease with heart failure: Secondary | ICD-10-CM | POA: Diagnosis not present

## 2023-12-21 DIAGNOSIS — I340 Nonrheumatic mitral (valve) insufficiency: Secondary | ICD-10-CM | POA: Diagnosis not present

## 2023-12-21 DIAGNOSIS — I255 Ischemic cardiomyopathy: Secondary | ICD-10-CM | POA: Diagnosis not present

## 2023-12-21 DIAGNOSIS — K603 Anal fistula, unspecified: Secondary | ICD-10-CM | POA: Insufficient documentation

## 2023-12-21 DIAGNOSIS — I5023 Acute on chronic systolic (congestive) heart failure: Secondary | ICD-10-CM

## 2023-12-21 DIAGNOSIS — Z4502 Encounter for adjustment and management of automatic implantable cardiac defibrillator: Secondary | ICD-10-CM | POA: Insufficient documentation

## 2023-12-21 DIAGNOSIS — Z95810 Presence of automatic (implantable) cardiac defibrillator: Secondary | ICD-10-CM | POA: Diagnosis not present

## 2023-12-21 DIAGNOSIS — Z91199 Patient's noncompliance with other medical treatment and regimen due to unspecified reason: Secondary | ICD-10-CM

## 2023-12-21 DIAGNOSIS — F17210 Nicotine dependence, cigarettes, uncomplicated: Secondary | ICD-10-CM | POA: Insufficient documentation

## 2023-12-21 DIAGNOSIS — I513 Intracardiac thrombosis, not elsewhere classified: Secondary | ICD-10-CM

## 2023-12-21 DIAGNOSIS — F1910 Other psychoactive substance abuse, uncomplicated: Secondary | ICD-10-CM

## 2023-12-21 DIAGNOSIS — Z79899 Other long term (current) drug therapy: Secondary | ICD-10-CM | POA: Insufficient documentation

## 2023-12-21 DIAGNOSIS — K5090 Crohn's disease, unspecified, without complications: Secondary | ICD-10-CM | POA: Insufficient documentation

## 2023-12-21 DIAGNOSIS — K50119 Crohn's disease of large intestine with unspecified complications: Secondary | ICD-10-CM

## 2023-12-21 DIAGNOSIS — Z5986 Financial insecurity: Secondary | ICD-10-CM | POA: Diagnosis not present

## 2023-12-21 DIAGNOSIS — I252 Old myocardial infarction: Secondary | ICD-10-CM | POA: Diagnosis not present

## 2023-12-21 DIAGNOSIS — I5022 Chronic systolic (congestive) heart failure: Secondary | ICD-10-CM | POA: Diagnosis not present

## 2023-12-21 MED ORDER — POTASSIUM CHLORIDE CRYS ER 20 MEQ PO TBCR: 20.0000 meq | EXTENDED_RELEASE_TABLET | Freq: Every day | ORAL | 11 refills | Status: AC

## 2023-12-21 MED ORDER — TORSEMIDE 20 MG PO TABS
40.0000 mg | ORAL_TABLET | Freq: Every day | ORAL | 11 refills | Status: AC
Start: 2023-12-21 — End: ?

## 2023-12-21 NOTE — Addendum Note (Signed)
 Encounter addended by: Nida Barrow, RN on: 12/21/2023 10:54 AM  Actions taken: Clinical Note Signed

## 2023-12-21 NOTE — Patient Instructions (Addendum)
 Thank you for coming in today  If you had labs drawn today, any labs that are abnormal the clinic will call you No news is good news  EKG was done today   Medications: STOP Asprin, Losartan , Digoxin , Spironolactone , Bumex  Start Torsemide  40 mg 2 tablets daily Start Potassium 20 meq 1 tablet daily  Follow up appointments: Your physician recommends that you return for lab work in: 1 weeks BMET  Your physician recommends that you schedule a follow-up appointment in:  3 weeks with Pharmacy 6 weeks in clinic  Please call [facility] to make appointment [phone]    Do the following things EVERYDAY: Weigh yourself in the morning before breakfast. Write it down and keep it in a log. Take your medicines as prescribed Eat low salt foods--Limit salt (sodium) to 2000 mg per day.  Stay as active as you can everyday Limit all fluids for the day to less than 2 liters   At the [facility], you and your health needs are our priority. As part of our continuing mission to provide you with exceptional heart care, we have created designated Provider Care Teams. These Care Teams include your primary Cardiologist (physician) and Advanced Practice Providers (APPs- Physician Assistants and Nurse Practitioners) who all work together to provide you with the care you need, when you need it.   You may see any of the following providers on your designated Care Team at your next follow up: Dr Jules Oar Dr Peder Bourdon Dr. Mimi Alt, NP Ruddy Corral, [place] [facility] [place], [place] Dennise Fitz, NP Luster Salters, PharmD   Please be sure to bring in all your medications bottles to every appointment.    Thank you for choosing [facility]  If you have any questions or concerns before your next appointment please send us  a message through [place] or call our office at [phone].    TO LEAVE A MESSAGE  FOR THE NURSE SELECT OPTION 2, PLEASE LEAVE A MESSAGE INCLUDING: YOUR NAME DATE OF BIRTH CALL BACK NUMBER REASON FOR CALL**this is important as we prioritize the call backs  YOU WILL RECEIVE A CALL BACK THE SAME DAY AS LONG AS YOU CALL BEFORE 4:00 PM

## 2023-12-21 NOTE — Progress Notes (Signed)
 Patient left before getting labs drawn,  Called both numbers provided to the clinic, one number was his mother and the other phone has no voicemail setup his mom stated that it only works with internet connection. spoke with his mother and told her to please have him call the clinic.

## 2023-12-21 NOTE — Progress Notes (Signed)
 ReDS Vest / Clip - 12/21/23 0900       ReDS Vest / Clip   Station Marker C    Ruler Value 28.5    ReDS Value Range High volume overload    ReDS Actual Value 47

## 2023-12-22 ENCOUNTER — Ambulatory Visit (HOSPITAL_COMMUNITY): Payer: Self-pay | Admitting: Family Medicine

## 2023-12-29 ENCOUNTER — Other Ambulatory Visit (HOSPITAL_COMMUNITY)

## 2024-01-08 ENCOUNTER — Ambulatory Visit: Admitting: Gastroenterology

## 2024-01-17 NOTE — Care Plan (Signed)
  Problem: Bowel/Gastric: Goal: Occurrences of nausea & vomiting will decrease Description: Occurrences of nausea & vomiting will decrease Outcome: Progressing   Problem: Fluid Volume: Goal: Maintenance of adequate hydration will improve - STG Description: Maintenance of adequate hydration will improve Outcome: Progressing   Problem: Health Behavior: Goal: Ability to verbalize causes of nausea has improved Description: Ability to verbalize causes of nausea has improved Outcome: Progressing Goal: Use of imagery and/or distraction to relieve nausea has improved Description: Use of imagery and/or distraction to relieve nausea has improved Outcome: Progressing Goal: Knowledge of medication purpose and the side effects will be met by discharge Description: Knowledge of medication purpose and the side effects will be met by discharge Outcome: Progressing Goal: MCB Ability to state ways to decrease the risk of falls will be met by discharge Description: Ability to state ways to decrease the risk of falls will improve by discharge Outcome: Progressing   Problem: Safety - Adult Goal: Free from fall injury Description: INTERVENTIONS: 1. Assess pt frequently for physical needs 2. Identify cognitive and physical deficits and behaviors that affect risk of falls. 3. Institute fall precautions as indicated by assessment. 4. Educate pt/family on patient safety including physical limitations 5. Instruct pt to call for assistance with activity based on assessment 6. Modify environment to reduce risk of injury 7. Consider OT/PT consult to assist with strengthening/mobility Outcome: Progressing Goal: Absence of infection during hospitalization Description: INTERVENTIONS: 1. Assess and monitor for signs and symptoms of infection 2. Monitor lab/diagnostic results 3. Monitor all insertion sites i.e., indwelling lines, tubes and drains 4. Monitor endotracheal (as able) and nasal secretions for  changes in amount and color 5. Institute appropriate cooling/warming therapies per order 6. Administer medications as ordered 7. Instruct and encourage patient and family to use good hand hygiene technique 8. Identify and instruct in appropriate isolation precautions for identified infection/condition Outcome: Progressing   Problem: INFECTION - ADULT Goal: Absence of infection during hospitalization Description: INTERVENTIONS: 1. Assess and monitor for signs and symptoms of infection 2. Monitor lab/diagnostic results 3. Monitor all insertion sites i.e., indwelling lines, tubes and drains 4. Monitor endotracheal (as able) and nasal secretions for changes in amount and color 5. Institute appropriate cooling/warming therapies per order 6. Administer medications as ordered 7. Instruct and encourage patient and family to use good hand hygiene technique 8. Identify and instruct in appropriate isolation precautions for identified infection/condition Outcome: Progressing Goal: Absence of fever/infection during anticipated neutropenic period Description: INTERVENTIONS 1. Monitor WBC 2. Administer growth factors as ordered 3. Implement neutropenic guidelines as ordered Outcome: Progressing

## 2024-01-18 NOTE — Progress Notes (Incomplete)
 ***In Progress***    Advanced [facility] Failure Clinic Note   Primary Care: [name], PA HF Cardiologist: Dr. Rolan  HPI:  Shaun [name] is a 38 y.o. male with history of CAD w/ prior MI in 2017, IV drug abuse, ischemic cardiomyopathy s/p ICD, chronic systolic CHF, HTN, Crohn's disease with perianal fistula, untreated chronic HepC, hx DVT/PE 04/2023.    Anterior STEMI c/b cardiac arrest in 2017 in setting of cocaine, heroin and tobacco use. S/p PCI/DES to ostial/prox LAD and mid LAD as well as PTCA to D1. EF 25% at time of MI.    EF has remained reduced. Echo 03/2023 with EF 15-20%. Later had VT and underwent ICD placement in 04/2023.    Admitted 04/2023 with sepsis 2/2 MSSA bacteremia. [facility] with PICC line and IV antibiotics. Had TEE in 06/2023 w/ no valvular or lead associated vegetations. LV function remained severely reduced.   Recent decompensation ED 10/07/23 in the setting of noncompliance with all medications for a few months d/t SE. CT A/P + colitis. Echo with EF < 20%, cannot rule out LV apical thrombus, RV mildly reduced, severe MR.    Cardiac MRI (09/2023) with LV EF 13%, moderate RV dysfunction, moderate-severe MR,  coronary-type LGE in the mid-apical anterior and anteroseptal walls suggestive of prior MI.    Admitted 11/2023 with a/c HF. Diuresed with IV lasix . Discharged home, weight 150 lbs.   Returned to [facility] for post hospital HF follow up 12/21/23. Overall was feeling poorly. He was SOB walking further distances on flat ground. + PND and foot swelling. Took Bumex x 1 due to increased SOB. Only taking ASA, Carvedilol  and Eliquis  (only once daily). Denies palpitations, abnormal bleeding, CP, dizziness, or orthopnea. + PND. Appetite was ok. Was not weighing at home. Smokes 1 ppd cigarettes, no ETOH, smokes THC. Lives with brother. ReDs reading: 47%, abnormal.  Today he returns to HF clinic for pharmacist medication titration. At last visit with  APP, Torsemide  40 mg daily + 20 mEq KCL was initiated. Additionally, carvedilol  was discontinued due to volume overload.   Shortness of breath/dyspnea on exertion? {YES NO:22349}  Orthopnea/PND? {YES NO:22349} Edema? {YES NO:22349} Lightheadedness/dizziness? {YES NO:22349} Daily weights at home? {YES NO:22349} Blood pressure/heart rate monitoring at home? {YES [id number] Following low-sodium/fluid-restricted diet? {YES NO:22349}  HF Medications: Torsemide  40 mg daily + KCL 20 mEq daily  Has the patient been experiencing any side effects to the medications prescribed?  {YES NO:22349}  Does the patient have any problems obtaining medications due to transportation or finances?   {YES NO:22349}  Understanding of regimen: {excellent/good/fair/poor:19665} Understanding of indications: {excellent/good/fair/poor:19665} Potential of compliance: {excellent/good/fair/poor:19665} Patient understands to avoid NSAIDs. Patient understands to avoid decongestants.    Pertinent Lab Values: Serum creatinine ***, BUN ***, Potassium ***, Sodium ***, BNP ***, Magnesium  ***, Digoxin  ***   Vital Signs: Weight: *** (last clinic weight: ***) Blood pressure: ***  Heart rate: ***   Assessment/Plan: 1. 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  Has ICD.  Echo in 09/2023 with EF < 20%, severe LV dilation, mildly decreased RV systolic function, severe MR.  Cardiac MRI (09/2023) with LV EF 13%, moderate RV dysfunction, moderate-severe MR,  coronary-type LGE in the mid-apical anterior and anteroseptal walls suggestive of prior MI. Suspect a mixed ischemic/nonischemic cardiomyopathy as the patient has global severe hypokinesis.  - Off most GDMT. NYHA IIb-III. He is volume overloaded today, weight up > 19 lbs, REDs 47%.  Has taken 1 dose of Bumex since discharge. *** - Continue torsemide  40 mg daily + 20 KCL daily. BMET/BNP today, repeat BMET in 1  week - Carvedilol  stopped 12/21/23 with volume overload and poor diuretic compliance. - Plan to add back spironolactone  and ARB/ARNi over next couple of visits. - No SGLT2 inhibitor with peri-anal fistula.  - Previously on digoxin , not currently taking. Can consider adding next visit, but I worry about compliance and follow  up.*** - Not candidate for advanced therapies with active substance abuse, social situation, and noncompliance.  2. CAD: STEMI in 2017 in setting of cocaine, heroin and tobacco use. S/p DES to ostial/prox LAD and mid LAD, PTCA to D1.  No chest pain.  - ASA was stopped 12/21/23 since on [facility]. - Should be on a statin. Address at follow up. 3. Mitral regurgitation: Possibly severe.  Functional MR.  - Can consider Mitraclip in the future if he can be compliant with medical therapy.  - Optimize HF 4. Possible LV thrombus: Not seen on 09/2023 cMRI.  - Continue Eliquis  5 mg bid, discussed this is a twice a day medication. *** 5. Polysubstance abuse: h/o cocaine, methamphetamines.  UDS only positive of THC this admission.  - Smokes 1 ppd and THC daily. Discussed cessation. 6. Crohn's disease: Flare this past admission, with peri-anal fistula.  - Completed abx. Had post hospital GI appt but this was cancelled. Will give him # to office to call and re-schedule *** - Tolerating regular diet.   Follow up ***   Tinnie Redman, PharmD, BCPS, BCCP, CPP Heart Failure Clinic Pharmacist [phone]

## 2024-01-19 ENCOUNTER — Other Ambulatory Visit (HOSPITAL_COMMUNITY)

## 2024-01-20 NOTE — Telephone Encounter (Signed)
 Attempted to contact Shaun [name] in regards to scheduling appointment w/ no success - left detailed message with appointment information and direct call back number for rescheduling if needed. Letter will also be mailed to home address on file.   Letter mailed to home address on file w/ pre booked post op appointment noted below.  Upon chart review, when pt was released from hospital, discharge summary did not have post operative appointment noted for some reason by nurse / provider. Only pc appt -  Scheduled Future Appointments       Provider Department Dept Phone Center   01/27/2024 1:30 PM Erin Benton Das [facility]  - Infectious Disease Specialty [facility] [phone]    02/01/2024 12:00 PM [name] [facility]  - [place] [phone]        [facility] [address], [address] [place], [place] [address] Thanks,   [name] 01/20/2024 10:44 AM

## 2024-02-02 NOTE — Progress Notes (Incomplete)
 ADVANCED HF CLINICNOTE   Primary Care: [name], PA HF Cardiologist: Dr. Rolan  HPI: Shaun [name] is a 38 y.o. male with history of CAD w/ prior MI in 2017, IV drug abuse, ischemic cardiomyopathy s/p ICD, chronic systolic CHF, HTN, Crohn's disease with perianal fistula, untreated chronic HepC, hx DVT/PE 10/24.    Anterior STEMI c/b cardiac arrest in 2017 in setting of cocaine, heroin and tobacco use. S/p PCI/DES to ostial/prox LAD and mid LAD as well as PTCA to D1. EF 25% at time of MI.    EF has remained reduced. Echo 9/24 with EF 15-20%. Later had VT and underwent ICD placement in 10/24.    Admitted 10/24 with sepsis 2/2 MSSA bacteremia. Discharged back to prison with PICC line and IV antibiotics. Had TEE in 12/24 w/ no valvular or lead associated vegetations. LV function remained severely reduced.   Recent decompensation ED 10/07/23 in the setting of noncompliance with all medications for a few months d/t SE. CT A/P + colitis. Echo with EF < 20%, cannot rule out LV apical thrombus, RV mildly reduced, severe MR.   Cardiac MRI (3/25) with LV EF 13%, moderate RV dysfunction, moderate-severe MR,  coronary-type LGE in the mid-apical anterior and anteroseptal walls suggestive of prior MI.    Admitted 5/25 with a/c HF. Diuresed with IV lasix . Discharged home, weight 150 lbs.  Admitted 6/25 for voluntary opioid (Fentanyl ) withdrawal. Started on methadone, left AMA after 3 days. Re-admitted 2 days later at [facility] with sepsis 2/2 to klebsiella and serratia Bacteremia from likely gut translocation. UDS + cocaine, fentanyl  and THC. Given IV abx, ID on board. Underwent laparoscopic loop ileostomy, anal seton placement. Discharged to prison.   Today he returns for post hospital HF follow up. Overall feeling poorly. He is SOB walking further distances on flat ground. + PND and foot swelling. Took bumex x 1 due to increased SOB. Only taking ASA, Coreg  and Eliquis  (only once daily). Denies  palpitations, abnormal bleeding, CP, dizziness, or orthopnea. + PND. Appetite ok. Not weighing at home. Smokes 1 ppd cigarettes, no ETOH, smokes THC. Lives with brother.  ReDs reading: 47%, abnormal  [employer] Device (personally reviewed): Volume up, 1.5 hr/day activity, <0.1 hr/day AT/AF, 19 runs of NSVT, <0.1% VP  ECG (personally reviewed): NSR, + LVH  Labs (5/25): K 5.1, creatinine 1.22, hgb 8.6  Cardiac Studies - cMRI 3/25: LVEF 13%, moderate RV dysfunction, moderate-severe MR, coronary-type LGE in the mid-apical anterior and anteroseptal walls suggestive of prior MI.   - Echo 3/25: EF< 20%, G3DD, RV mildly reduced, severe MR, cannot rule out LV thrombus  - Echo 6/21: EF 20-25%, normal RV, mild MR   SH: Released from prison on 10/05/23. Now living in [place]. Reports he did not use any drugs in prison other than suboxone. UDS 10/09/23 + opiates, amphetamine, THC.  Past Medical History:  Diagnosis Date   Acute deep vein thrombosis (DVT) of brachial vein of left upper extremity (HCC) 05/19/2023   Beta-hemolytic group B streptococcal sepsis (HCC) 10/2019   at [facility]   Cellulitis of left upper extremity 12/30/2019   Chronic systolic CHF (congestive heart failure) (HCC) 12/30/2019   Coronary artery disease involving native coronary artery of native heart without angina pectoris 12/30/2019   Essential hypertension 12/30/2019   Hypertension    Intravenous drug abuse (HCC) 12/30/2019   Ischemic cardiomyopathy 12/30/2019   Mixed hyperlipidemia 12/30/2019   Nicotine  dependence, cigarettes, uncomplicated 12/30/2019   Other pulmonary embolism without  acute cor pulmonale (HCC) 05/19/2023   V tach (HCC) 04/22/2023   Current Outpatient Medications  Medication Sig Dispense Refill   apixaban  (ELIQUIS ) 5 MG TABS tablet Take 1 tablet (5 mg total) by mouth 2 (two) times daily. 60 tablet 2   polyethylene glycol powder (GLYCOLAX /MIRALAX ) 17 GM/SCOOP powder Take 1 capful (17 g) by mouth  daily as needed for mild constipation. (Patient not taking: Reported on 12/21/2023) 238 g 0   potassium chloride  SA (KLOR-CON  M) 20 MEQ tablet Take 1 tablet (20 mEq total) by mouth daily. 30 tablet 11   torsemide  (DEMADEX ) 20 MG tablet Take 2 tablets (40 mg total) by mouth daily. 60 tablet 11   No current facility-administered medications for this visit.   Allergies  Allergen Reactions   Penicillins Hives and Swelling   Codeine Itching   Ketorolac  Itching and Other (See Comments)    nightmares   Social History   Socioeconomic History   Marital status: Single    Spouse name: Not on file   Number of children: Not on file   Years of education: Not on file   Highest education level: Not on file  Occupational History   Not on file  Tobacco Use   Smoking status: Every Day    Current packs/day: 1.00    Types: Cigarettes   Smokeless tobacco: Never  Substance and Sexual Activity   Alcohol use: No   Drug use: Yes    Types: IV, Marijuana    Comment: opiates   Sexual activity: Not on file  Other Topics Concern   Not on file  Social History Narrative   Not on file   Social Drivers of Health   Financial Resource Strain: High Risk (05/19/2023)   Received from [facility]   Overall Financial Resource Strain (CARDIA)    Difficulty of Paying Living Expenses: Very hard  Food Insecurity: No Food Insecurity (11/30/2023)   Hunger Vital Sign    Worried About Running Out of Food in the Last Year: Never true    Ran Out of Food in the Last Year: Never true  Transportation Needs: No Transportation Needs (11/30/2023)   PRAPARE - Administrator, Civil Service (Medical): No    Lack of Transportation (Non-Medical): No  Physical Activity: Not on file  Stress: Not on file  Social Connections: Unknown (07/02/2022)   [facility]   Social Network    Social Network: Not on file  Intimate Partner Violence: Not At Risk (11/30/2023)   Humiliation, Afraid, Rape, and Kick  questionnaire    Fear of Current or Ex-Partner: No    Emotionally Abused: No    Physically Abused: No    Sexually Abused: No    Family History  Family history unknown: Yes   Wt Readings from Last 3 Encounters:  12/21/23 76.7 kg (169 lb)  12/03/23 68.3 kg (150 lb 9.2 oz)  10/19/23 75 kg (165 lb 6.4 oz)   There were no vitals taken for this visit.  PHYSICAL EXAM: General:  NAD. No resp difficulty, walked into clinic HEENT: Normal Neck: Supple. JVP to jaw Cor: Regular rate & rhythm. No rubs, gallops or murmurs. Lungs: Clear Abdomen: Soft, nontender, nondistended.  Extremities: No cyanosis, clubbing, rash, trace BLE edema Neuro: Alert & oriented x 3, moves all 4 extremities w/o difficulty. Affect pleasant.  ASSESSMENT & PLAN: 1. 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  Has ICD.  Echo in 3/25 with EF <  20%, severe LV dilation, mildly decreased RV systolic function, severe MR.  Cardiac MRI (3/25) with LV EF 13%, moderate RV dysfunction, moderate-severe MR,  coronary-type LGE in the mid-apical anterior and anteroseptal walls suggestive of prior MI. I suspect a mixed ischemic/nonischemic cardiomyopathy as the patient has global severe hypokinesis. Off most GDMT. NYHA IIb-III. He is volume overloaded today, weight up > 19 lbs, REDs 47%. Has taken 1 dose of Bumex since discharge. - Stop Coreg  with volume overload. - Start torsemide  40 mg daily + 20 KCL daily. BMET/BNP today, repeat BMET in 1 week - Plan to add back spiro ARB/ARNi over next couple of visits. - No SGLT2 inhibitor with peri-anal fistula.  - Previously on digoxin , not currently taking. Can consider adding next visit, but I worry about compliance and follow  up. - Not candidate for advanced therapies with active substance abuse, social situation, and noncompliance.  2. CAD: STEMI in 2017 in setting of cocaine, heroin and tobacco use. S/p DES to ostial/prox LAD  and mid LAD, PTCA to D1.  No chest pain.  - Stop ASA with AC. - Should be on a statin. Address at follow up. 3. Mitral regurgitation: Possibly severe.  Functional MR.  - Can consider Mitraclip in the future if he can be compliant with medical therapy.  - Optimize HF 4. Possible LV thrombus: Not seen on 3/25 cMRI.  - Continue Eliquis  5 mg bid, discussed this is a twice a day medication.  5. Polysubstance abuse: h/o cocaine, methamphetamines.  UDS only positive of THC this admission.  - Smokes 1 ppd and THC daily. Discussed cessation. 6. Crohn's disease: Flare this past admission, with peri-anal fistula.  - Completed abx. Had post hospital GI appt but this was cancelled. Will give him # to office to call and re-schedule - Tolerating regular diet.   High risk for re-admission. Follow up in 3 weeks with PharmD and 6 weeks with APP.  Harlene Gainer, FNP-BC 02/02/24

## 2024-02-03 ENCOUNTER — Encounter (HOSPITAL_COMMUNITY)

## 2024-02-25 ENCOUNTER — Telehealth (HOSPITAL_COMMUNITY): Payer: Self-pay | Admitting: Cardiology
